# Patient Record
Sex: Female | Born: 1935 | Race: White | Hispanic: No | Smoking: Never smoker
Health system: Southern US, Community
[De-identification: ages and names within clinical notes are randomized; demographics above are authoritative.]

## PROBLEM LIST (undated history)

## (undated) DIAGNOSIS — E039 Hypothyroidism, unspecified: Secondary | ICD-10-CM

## (undated) DIAGNOSIS — C50919 Malignant neoplasm of unspecified site of unspecified female breast: Secondary | ICD-10-CM

## (undated) DIAGNOSIS — Z923 Personal history of irradiation: Secondary | ICD-10-CM

## (undated) DIAGNOSIS — M1990 Unspecified osteoarthritis, unspecified site: Secondary | ICD-10-CM

## (undated) DIAGNOSIS — S81819A Laceration without foreign body, unspecified lower leg, initial encounter: Secondary | ICD-10-CM

## (undated) DIAGNOSIS — E079 Disorder of thyroid, unspecified: Secondary | ICD-10-CM

## (undated) DIAGNOSIS — I10 Essential (primary) hypertension: Secondary | ICD-10-CM

## (undated) DIAGNOSIS — L539 Erythematous condition, unspecified: Secondary | ICD-10-CM

## (undated) DIAGNOSIS — E7800 Pure hypercholesterolemia, unspecified: Secondary | ICD-10-CM

## (undated) DIAGNOSIS — C50011 Malignant neoplasm of nipple and areola, right female breast: Secondary | ICD-10-CM

## (undated) HISTORY — DX: Disorder of thyroid, unspecified: E07.9

## (undated) HISTORY — DX: Pure hypercholesterolemia, unspecified: E78.00

## (undated) HISTORY — DX: Unspecified osteoarthritis, unspecified site: M19.90

## (undated) HISTORY — PX: EYE SURGERY: SHX253

## (undated) HISTORY — PX: JOINT REPLACEMENT: SHX530

## (undated) HISTORY — PX: BREAST BIOPSY: SHX20

## (undated) HISTORY — DX: Personal history of irradiation: Z92.3

---

## 1970-02-25 HISTORY — PX: TUBAL LIGATION: SHX77

## 1998-05-29 ENCOUNTER — Emergency Department (HOSPITAL_COMMUNITY): Admission: EM | Admit: 1998-05-29 | Discharge: 1998-05-29 | Payer: Self-pay | Admitting: Emergency Medicine

## 1998-12-12 ENCOUNTER — Encounter: Admission: RE | Admit: 1998-12-12 | Discharge: 1998-12-12 | Payer: Self-pay | Admitting: Family Medicine

## 1998-12-12 ENCOUNTER — Encounter: Payer: Self-pay | Admitting: Family Medicine

## 1999-03-07 ENCOUNTER — Encounter: Payer: Self-pay | Admitting: Family Medicine

## 1999-03-07 ENCOUNTER — Encounter: Admission: RE | Admit: 1999-03-07 | Discharge: 1999-03-07 | Payer: Self-pay | Admitting: Family Medicine

## 1999-12-18 ENCOUNTER — Other Ambulatory Visit: Admission: RE | Admit: 1999-12-18 | Discharge: 1999-12-18 | Payer: Self-pay | Admitting: Family Medicine

## 2001-01-08 ENCOUNTER — Other Ambulatory Visit: Admission: RE | Admit: 2001-01-08 | Discharge: 2001-01-08 | Payer: Self-pay | Admitting: Family Medicine

## 2001-02-16 ENCOUNTER — Encounter: Payer: Self-pay | Admitting: Family Medicine

## 2001-02-16 ENCOUNTER — Encounter: Admission: RE | Admit: 2001-02-16 | Discharge: 2001-02-16 | Payer: Self-pay | Admitting: Family Medicine

## 2002-02-02 ENCOUNTER — Other Ambulatory Visit: Admission: RE | Admit: 2002-02-02 | Discharge: 2002-02-02 | Payer: Self-pay | Admitting: Family Medicine

## 2002-02-23 ENCOUNTER — Encounter: Admission: RE | Admit: 2002-02-23 | Discharge: 2002-02-23 | Payer: Self-pay | Admitting: Family Medicine

## 2002-02-23 ENCOUNTER — Encounter: Payer: Self-pay | Admitting: Family Medicine

## 2002-03-04 ENCOUNTER — Encounter: Payer: Self-pay | Admitting: Family Medicine

## 2002-03-04 ENCOUNTER — Encounter: Admission: RE | Admit: 2002-03-04 | Discharge: 2002-03-04 | Payer: Self-pay | Admitting: Family Medicine

## 2003-03-08 ENCOUNTER — Encounter: Admission: RE | Admit: 2003-03-08 | Discharge: 2003-03-08 | Payer: Self-pay | Admitting: Family Medicine

## 2004-02-10 ENCOUNTER — Other Ambulatory Visit: Admission: RE | Admit: 2004-02-10 | Discharge: 2004-02-10 | Payer: Self-pay | Admitting: Family Medicine

## 2004-03-13 ENCOUNTER — Encounter: Admission: RE | Admit: 2004-03-13 | Discharge: 2004-03-13 | Payer: Self-pay | Admitting: Family Medicine

## 2005-02-12 ENCOUNTER — Other Ambulatory Visit: Admission: RE | Admit: 2005-02-12 | Discharge: 2005-02-12 | Payer: Self-pay | Admitting: Family Medicine

## 2005-03-21 ENCOUNTER — Encounter: Admission: RE | Admit: 2005-03-21 | Discharge: 2005-03-21 | Payer: Self-pay | Admitting: Family Medicine

## 2006-04-08 ENCOUNTER — Ambulatory Visit: Payer: Self-pay | Admitting: Gastroenterology

## 2006-04-23 ENCOUNTER — Encounter (INDEPENDENT_AMBULATORY_CARE_PROVIDER_SITE_OTHER): Payer: Self-pay | Admitting: *Deleted

## 2006-04-23 ENCOUNTER — Ambulatory Visit: Payer: Self-pay | Admitting: Gastroenterology

## 2008-02-22 ENCOUNTER — Other Ambulatory Visit: Admission: RE | Admit: 2008-02-22 | Discharge: 2008-02-22 | Payer: Self-pay | Admitting: Family Medicine

## 2008-07-20 ENCOUNTER — Encounter: Admission: RE | Admit: 2008-07-20 | Discharge: 2008-07-20 | Payer: Self-pay | Admitting: Family Medicine

## 2009-06-03 ENCOUNTER — Emergency Department (HOSPITAL_COMMUNITY): Admission: EM | Admit: 2009-06-03 | Discharge: 2009-06-03 | Payer: Self-pay | Admitting: Emergency Medicine

## 2010-03-19 ENCOUNTER — Encounter: Payer: Self-pay | Admitting: Family Medicine

## 2010-11-07 ENCOUNTER — Other Ambulatory Visit: Payer: Self-pay | Admitting: Family Medicine

## 2010-11-09 ENCOUNTER — Other Ambulatory Visit: Payer: Self-pay | Admitting: Family Medicine

## 2010-11-09 DIAGNOSIS — N63 Unspecified lump in unspecified breast: Secondary | ICD-10-CM

## 2010-11-16 ENCOUNTER — Ambulatory Visit
Admission: RE | Admit: 2010-11-16 | Discharge: 2010-11-16 | Disposition: A | Discharge status: Home / Self Care | Payer: Medicare Other | Source: Ambulatory Visit | Attending: Family Medicine | Admitting: Family Medicine

## 2010-11-16 DIAGNOSIS — N63 Unspecified lump in unspecified breast: Secondary | ICD-10-CM

## 2010-11-26 DIAGNOSIS — C50919 Malignant neoplasm of unspecified site of unspecified female breast: Secondary | ICD-10-CM

## 2010-11-26 HISTORY — DX: Malignant neoplasm of unspecified site of unspecified female breast: C50.919

## 2010-11-28 ENCOUNTER — Ambulatory Visit (INDEPENDENT_AMBULATORY_CARE_PROVIDER_SITE_OTHER): Payer: Medicare Other | Admitting: Surgery

## 2010-11-28 ENCOUNTER — Encounter (INDEPENDENT_AMBULATORY_CARE_PROVIDER_SITE_OTHER): Payer: Self-pay | Admitting: Surgery

## 2010-11-28 VITALS — BP 138/82 | HR 60 | Temp 97.6°F | Resp 16 | Ht 63.5 in | Wt 161.4 lb

## 2010-11-28 DIAGNOSIS — N63 Unspecified lump in unspecified breast: Secondary | ICD-10-CM

## 2010-11-28 DIAGNOSIS — N6310 Unspecified lump in the right breast, unspecified quadrant: Secondary | ICD-10-CM

## 2010-11-28 HISTORY — PX: BREAST SURGERY: SHX581

## 2010-11-28 NOTE — Progress Notes (Signed)
Chief Complaint  Patient presents with  . Other    new pt eval of right nipple changes     HPI Molly Lambert is a 76 y.o. female.   HPI The patient presents at the request of Dr.Lin of radiology due to a red,  hard right nipple. This has been present since July of 2012. He has not bled. It has not increased in size. He is not painful.  History reviewed. No pertinent past medical history.  Past Surgical History  Procedure Date  . Tubal ligation 1972    Family History  Problem Relation Age of Onset  . Kidney disease Mother     kidney failure   . Heart disease Father     heart attack     Social History History  Substance Use Topics  . Smoking status: Never Smoker   . Smokeless tobacco: Never Used  . Alcohol Use: No    No Known Allergies  Current Outpatient Prescriptions  Medication Sig Dispense Refill  . atenolol-chlorthalidone (TENORETIC) 50-25 MG per tablet       . simvastatin (ZOCOR) 20 MG tablet       . SYNTHROID 75 MCG tablet         Review of Systems Review of Systems  Constitutional: Negative.   HENT: Negative.   Eyes: Negative.   Respiratory: Negative.   Cardiovascular: Negative.   Gastrointestinal: Negative.   Genitourinary: Negative.   Musculoskeletal: Negative.   Skin: Negative.   Neurological: Negative.   Hematological: Negative.   Psychiatric/Behavioral: Negative.     Blood pressure 138/82, pulse 60, temperature 97.6 F (36.4 C), resp. rate 16, height 5' 3.5" (1.613 m), weight 161 lb 6 oz (73.199 kg).  Physical Exam Physical Exam  Constitutional: She is oriented to person, place, and time. She appears well-developed and well-nourished.  HENT:  Head: Normocephalic.  Nose: Nose normal.  Eyes: Conjunctivae are normal. Pupils are equal, round, and reactive to light.  Neck: Normal range of motion. Neck supple.  Cardiovascular: Normal rate, regular rhythm and normal heart sounds.   Pulmonary/Chest: Effort normal and breath sounds normal.    Right nipple hard red and full.  Non tender.Right axilla normal.  Left axilla normal. Left breast normal  Musculoskeletal: Normal range of motion.  Neurological: She is alert and oriented to person, place, and time.  Skin: Skin is warm and dry.  Psychiatric: She has a normal mood and affect. Her behavior is normal. Judgment and thought content normal.    Data Reviewed Mammo report BIRADS 4 FULLNESS AT NIPPLE RIGHT  Assessment    Right nipple mass     Plan    Punch biopsy Right nipple to exclude malignancy.  The patient understands the procedure and reason.  She agrees to proceed.  Under sterile conditions,  Right nipple was prepped.  2 MM core biopsy taken after injection of 1% lidocaine plain. Core taken and sent to pathology.  Dry dressing applied. She tolerated procedure well. RTC  IN 1-2 WEEKS.       Molly Lambert A. 11/28/2010, 3:58 PM

## 2010-11-28 NOTE — Patient Instructions (Signed)
Keep a band aid over biopsy site.  Place neosporin and band aid over site daily.  OK to shower.  Call Friday for results.

## 2010-12-03 ENCOUNTER — Telehealth (INDEPENDENT_AMBULATORY_CARE_PROVIDER_SITE_OTHER): Payer: Self-pay | Admitting: Surgery

## 2010-12-05 ENCOUNTER — Encounter: Payer: Self-pay | Admitting: Surgery

## 2010-12-05 NOTE — Telephone Encounter (Signed)
PT CONTACTED THIS AM RE PATHOLOGY RESULTS. SHE WILL RECEIVE FURTHER INSTRUCTIONS AFTER BREAST CANCER CONFERENCE TODAY.

## 2010-12-06 ENCOUNTER — Other Ambulatory Visit (INDEPENDENT_AMBULATORY_CARE_PROVIDER_SITE_OTHER): Payer: Self-pay | Admitting: General Surgery

## 2010-12-06 DIAGNOSIS — C50912 Malignant neoplasm of unspecified site of left female breast: Secondary | ICD-10-CM

## 2010-12-06 DIAGNOSIS — C50911 Malignant neoplasm of unspecified site of right female breast: Secondary | ICD-10-CM

## 2010-12-13 ENCOUNTER — Ambulatory Visit
Admission: RE | Admit: 2010-12-13 | Discharge: 2010-12-13 | Disposition: A | Discharge status: Home / Self Care | Payer: Medicare Other | Source: Ambulatory Visit | Attending: Surgery | Admitting: Surgery

## 2010-12-13 DIAGNOSIS — C50911 Malignant neoplasm of unspecified site of right female breast: Secondary | ICD-10-CM

## 2010-12-13 MED ORDER — GADOBENATE DIMEGLUMINE 529 MG/ML IV SOLN
15.0000 mL | Freq: Once | INTRAVENOUS | Status: AC | PRN
  Administered 2010-12-13: 15 mL via INTRAVENOUS

## 2010-12-28 ENCOUNTER — Ambulatory Visit (INDEPENDENT_AMBULATORY_CARE_PROVIDER_SITE_OTHER): Payer: Medicare Other | Admitting: Surgery

## 2010-12-28 ENCOUNTER — Other Ambulatory Visit (INDEPENDENT_AMBULATORY_CARE_PROVIDER_SITE_OTHER): Payer: Self-pay | Admitting: Surgery

## 2010-12-28 ENCOUNTER — Encounter (INDEPENDENT_AMBULATORY_CARE_PROVIDER_SITE_OTHER): Payer: Self-pay | Admitting: Surgery

## 2010-12-28 VITALS — BP 150/88 | HR 70 | Temp 97.4°F | Resp 14 | Ht 63.5 in | Wt 159.6 lb

## 2010-12-28 DIAGNOSIS — C50919 Malignant neoplasm of unspecified site of unspecified female breast: Secondary | ICD-10-CM

## 2010-12-28 DIAGNOSIS — C50911 Malignant neoplasm of unspecified site of right female breast: Secondary | ICD-10-CM

## 2010-12-28 NOTE — Progress Notes (Signed)
The patient returns to clinic today. Diagnosis came back as invasive breast cancer. This is in her right nipple. MRI done showing maximum diameter be 2.3 cm. This is ER positive PR positive HER-2/neu negative. MR showed no other foci of malignancy.  Review of systems: Negative  Physical exam: Right breast shows minimal ulceration at the nipple. Residual normal. Left breast and left axilla normal  Extremities: No edema with normal range of motion  Cardiac: Regular rate and rhythm  Pulmonary: Clear to auscultation  Abdomen: Benign  Impression: T2 N0 MX stage II right breast cancer in the nipple ER positive PR positive HER-2/neu negative  Plan: I discussed both mastectomy and breast conserving surgery with the patient and family present today. I discussed the pros and cons of each. I discussed potential complications of surgical intervention to include bleeding, infection, seroma formation, need for further surgery, blood clot formation, arm stiffness, shoulder stiffness, numbness of the breast or chest wall. She would like to proceed with right breast lumpectomy which include removal of her nipple and right axillary sentinel lymph node mapping. I discussed the use of the dyes for the mapping procedure and potential complications of using these dyes. These include redness, skin discoloration, soreness, anaphylaxis. She agrees to proceed

## 2010-12-28 NOTE — Patient Instructions (Signed)
You will be scheduled for surgery.Lumpectomy, Breast Conserving Surgery Care After Please read the instructions outlined below and refer to this sheet in the next few weeks. These discharge instructions provide you with general information on caring for yourself after you leave the hospital. Your surgeon may also give you specific instructions. While your treatment has been planned according to the most current medical practices available, unavoidable complications occasionally occur. If you have any problems or questions after discharge, please call your surgeon. Reasons for a lumpectomy:  Any solid breast mass.   Grouped significant nodularity that may be confused with a solitary breast mass.  AFTER THE PROCEDURE  After surgery, you will be taken to the recovery area where a nurse will watch and check your progress. Once you're awake, stable, and taking fluids well, barring other problems you will be allowed to go home.   Ice packs applied to your operative site may help with discomfort and keep the swelling down.   A small rubber drain may be placed in the incision for a couple of days to prevent a hematoma in the breast.   A pressure dressing may be applied for 24 to 48 hours to prevent bleeding.   Keep the wound dry.   You may resume a normal diet and activities as directed. Avoid strenuous activities affecting the arm on the side of the biopsy site such as tennis, swimming, heavy lifting (more than 10 pounds) or pulling.   Bruising in the breast is normal following this procedure.   Wearing a bra - even to bed - may be more comfortable and also help keep the dressing on.   Change dressings as directed.   Only take over-the-counter or prescription medicines for pain, discomfort, or fever as directed by your caregiver.  Call for your results as instructed by your surgeon. Remember it isyour responsibility to get the results of your lumpectomy if your surgeon asked you to follow-up.  Do not assume everything is fine if you have not heard from your caregiver. SEEK MEDICAL CARE IF:   There is increased bleeding (more than a small spot) from the wound.   You notice redness, swelling, or increasing pain in the wound.   Pus is coming from wound.   An unexplained oral temperature above 102 F (38.9 C) develops.   You notice a foul smell coming from the wound or dressing.  SEEK IMMEDIATE MEDICAL CARE IF:   You develop a rash.   You have difficulty breathing.   You have any allergic problems.  Document Released: 02/27/2006 Document Revised: 10/24/2010 Document Reviewed: 01/30/2007 [facility] Patient Information 2012 [place], [place].

## 2010-12-31 ENCOUNTER — Other Ambulatory Visit (INDEPENDENT_AMBULATORY_CARE_PROVIDER_SITE_OTHER): Payer: Self-pay | Admitting: Surgery

## 2011-01-10 ENCOUNTER — Encounter (HOSPITAL_COMMUNITY): Payer: Self-pay

## 2011-01-15 ENCOUNTER — Encounter (HOSPITAL_COMMUNITY): Payer: Self-pay

## 2011-01-15 ENCOUNTER — Encounter (HOSPITAL_COMMUNITY)
Admission: RE | Admit: 2011-01-15 | Discharge: 2011-01-15 | Disposition: A | Discharge status: Home / Self Care | Payer: Medicare Other | Source: Ambulatory Visit | Attending: Surgery | Admitting: Surgery

## 2011-01-15 ENCOUNTER — Other Ambulatory Visit: Payer: Self-pay

## 2011-01-15 HISTORY — DX: Laceration without foreign body, unspecified lower leg, initial encounter: S81.819A

## 2011-01-15 HISTORY — DX: Essential (primary) hypertension: I10

## 2011-01-15 LAB — COMPREHENSIVE METABOLIC PANEL
ALT: 13 U/L (ref 0–35)
AST: 17 U/L (ref 0–37)
Calcium: 9.8 mg/dL (ref 8.4–10.5)
Creatinine, Ser: 0.85 mg/dL (ref 0.50–1.10)
Sodium: 137 mEq/L (ref 135–145)
Total Protein: 7.1 g/dL (ref 6.0–8.3)

## 2011-01-15 LAB — DIFFERENTIAL
Eosinophils Relative: 1 % (ref 0–5)
Lymphocytes Relative: 26 % (ref 12–46)
Lymphs Abs: 2.3 10*3/uL (ref 0.7–4.0)
Monocytes Absolute: 0.7 10*3/uL (ref 0.1–1.0)
Neutro Abs: 5.4 10*3/uL (ref 1.7–7.7)

## 2011-01-15 LAB — CBC
MCH: 30.9 pg (ref 26.0–34.0)
MCHC: 33.4 g/dL (ref 30.0–36.0)
Platelets: 282 10*3/uL (ref 150–400)
RDW: 13.1 % (ref 11.5–15.5)

## 2011-01-15 LAB — SURGICAL PCR SCREEN: MRSA, PCR: NEGATIVE

## 2011-01-15 NOTE — Pre-Procedure Instructions (Addendum)
22 Molly Lambert  01/15/2011   Your procedure is scheduled on:  01/22/11  Report to [facility] at 530 AM.  Call this number if you have problems the morning of surgery: [phone]   Remember:   Do not eat food:After Midnight.  Do not drink clear liquids: 4 Hours before arrival.  Take these medicines the morning of surgery with A SIP OF WATER: SYNTHROID,TENORIC   Do not wear jewelry, make-up or nail polish.  Do not wear lotions, powders, or perfumes. You may wear deodorant.  Do not shave 48 hours prior to surgery.  Do not bring valuables to the hospital.  Contacts, dentures or bridgework may not be worn into surgery.  Leave suitcase in the car. After surgery it may be brought to your room.  For patients admitted to the hospital, checkout time is 11:00 AM the day of discharge.   Patients discharged the day of surgery will not be allowed to drive home.  Name and phone number of your driver: FAMILY  Special Instructions: CHG Shower Use Special Wash: 1/2 bottle night before surgery and 1/2 bottle morning of surgery.   Please read over the following fact sheets that you were given: Pain Booklet, MRSA Information and Surgical Site Infection Prevention

## 2011-01-16 NOTE — Progress Notes (Signed)
Anesthesia , please review EKG prior to surgery. Thank you.

## 2011-01-16 NOTE — Consult Note (Signed)
Anesthesia: Patient is a 76 year old female for right breast lumpectomy for cancer.  Other hx includes HTN and cataracts.  I was asked to review her preoperative EKG showing SB with LAFB.  No CV symptoms recorded at PAT visit.  Her PCP is listed as Dr. [name] [phone]).  I attempted to call for a comparison EKG but there is no answer, and we do not have a fax number listed.  I have asked the nursing staff to follow up.  If no comparison EKG available, still anticipate that she could proceed with this procedure if she remains asymptomatic.  Labs and CXR noted.

## 2011-01-21 MED ORDER — CEFAZOLIN SODIUM-DEXTROSE 2-3 GM-% IV SOLR
2.0000 g | INTRAVENOUS | Status: AC
  Administered 2011-01-22: 2 g via INTRAVENOUS
  Filled 2011-01-21: qty 50

## 2011-01-21 NOTE — Consult Note (Signed)
Anesthesia:  See my consult note from 01/16/11.  Prior phone number for Dr. Audria Nine was incorrect per RN.  They were told to contact the [facility] [phone]) which they did on Friday 01/18/11, and it was closed.  I just tried again and the message still says the office is currently closed.  Clinical correlation DOS by her Anesthesiologist, but as before, I anticipate that she could proceed if remains asymptomatic.

## 2011-01-21 NOTE — H&P (View-Only) (Signed)
The patient returns to clinic today. Diagnosis came back as invasive breast cancer. This is in her right nipple. MRI done showing maximum diameter be 2.3 cm. This is ER positive PR positive HER-2/neu negative. MR showed no other foci of malignancy.  Review of systems: Negative  Physical exam: Right breast shows minimal ulceration at the nipple. Residual normal. Left breast and left axilla normal  Extremities: No edema with normal range of motion  Cardiac: Regular rate and rhythm  Pulmonary: Clear to auscultation  Abdomen: Benign  Impression: T2 N0 MX stage II right breast cancer in the nipple ER positive PR positive HER-2/neu negative  Plan: I discussed both mastectomy and breast conserving surgery with the patient and family present today. I discussed the pros and cons of each. I discussed potential complications of surgical intervention to include bleeding, infection, seroma formation, need for further surgery, blood clot formation, arm stiffness, shoulder stiffness, numbness of the breast or chest wall. She would like to proceed with right breast lumpectomy which include removal of her nipple and right axillary sentinel lymph node mapping. I discussed the use of the dyes for the mapping procedure and potential complications of using these dyes. These include redness, skin discoloration, soreness, anaphylaxis. She agrees to proceed 

## 2011-01-21 NOTE — Interval H&P Note (Signed)
History and Physical Interval Note:   01/21/2011   9:48 AM   Molly Lambert  has presented today for surgery, with the diagnosis of right breast cancer  The various methods of treatment have been discussed with the patient and family. After consideration of risks, benefits and other options for treatment, the patient has consented to  Procedure(s): BREAST LUMPECTOMY WITH SENTINEL LYMPH NODE BX as a surgical intervention .  The patients' history has been reviewed, patient examined, no change in status, stable for surgery.  I have reviewed the patients' chart and labs.  Questions were answered to the patient's satisfaction.     Tenecia Ignasiak A.  MD

## 2011-01-22 ENCOUNTER — Encounter (HOSPITAL_COMMUNITY): Payer: Self-pay | Admitting: Surgery

## 2011-01-22 ENCOUNTER — Encounter (HOSPITAL_COMMUNITY)
Admission: RE | Disposition: A | Discharge status: Home / Self Care | Payer: Self-pay | Source: Ambulatory Visit | Attending: Surgery

## 2011-01-22 ENCOUNTER — Other Ambulatory Visit (INDEPENDENT_AMBULATORY_CARE_PROVIDER_SITE_OTHER): Payer: Self-pay | Admitting: Surgery

## 2011-01-22 ENCOUNTER — Ambulatory Visit (HOSPITAL_COMMUNITY): Payer: Medicare Other

## 2011-01-22 ENCOUNTER — Encounter (HOSPITAL_COMMUNITY): Payer: Self-pay | Admitting: Vascular Surgery

## 2011-01-22 ENCOUNTER — Ambulatory Visit (HOSPITAL_COMMUNITY)
Admission: RE | Admit: 2011-01-22 | Discharge: 2011-01-22 | Disposition: A | Discharge status: Home / Self Care | Payer: Medicare Other | Source: Ambulatory Visit | Attending: Surgery | Admitting: Surgery

## 2011-01-22 ENCOUNTER — Ambulatory Visit (HOSPITAL_COMMUNITY): Payer: Medicare Other | Admitting: Vascular Surgery

## 2011-01-22 DIAGNOSIS — C50019 Malignant neoplasm of nipple and areola, unspecified female breast: Secondary | ICD-10-CM | POA: Insufficient documentation

## 2011-01-22 DIAGNOSIS — C50919 Malignant neoplasm of unspecified site of unspecified female breast: Secondary | ICD-10-CM

## 2011-01-22 DIAGNOSIS — N6310 Unspecified lump in the right breast, unspecified quadrant: Secondary | ICD-10-CM

## 2011-01-22 DIAGNOSIS — Z01810 Encounter for preprocedural cardiovascular examination: Secondary | ICD-10-CM | POA: Insufficient documentation

## 2011-01-22 DIAGNOSIS — Z01818 Encounter for other preprocedural examination: Secondary | ICD-10-CM | POA: Insufficient documentation

## 2011-01-22 DIAGNOSIS — C50911 Malignant neoplasm of unspecified site of right female breast: Secondary | ICD-10-CM

## 2011-01-22 DIAGNOSIS — I10 Essential (primary) hypertension: Secondary | ICD-10-CM | POA: Insufficient documentation

## 2011-01-22 DIAGNOSIS — E039 Hypothyroidism, unspecified: Secondary | ICD-10-CM | POA: Insufficient documentation

## 2011-01-22 HISTORY — PX: BREAST LUMPECTOMY: SHX2

## 2011-01-22 SURGERY — BREAST LUMPECTOMY WITH SENTINEL LYMPH NODE BX
Anesthesia: General | Site: Breast | Laterality: Right | Wound class: Clean

## 2011-01-22 MED ORDER — OXYCODONE-ACETAMINOPHEN 5-325 MG PO TABS: 1.0000 | ORAL_TABLET | ORAL | Status: AC | PRN

## 2011-01-22 MED ORDER — DEXAMETHASONE SODIUM PHOSPHATE 4 MG/ML IJ SOLN
INTRAMUSCULAR | Status: DC | PRN
  Administered 2011-01-22: 8 mg via INTRAVENOUS

## 2011-01-22 MED ORDER — SODIUM CHLORIDE 0.9 % IR SOLN
Status: DC | PRN
  Administered 2011-01-22: 1000 mL

## 2011-01-22 MED ORDER — LACTATED RINGERS IV SOLN
INTRAVENOUS | Status: DC | PRN
  Administered 2011-01-22 (×2): via INTRAVENOUS

## 2011-01-22 MED ORDER — DEXTROSE 5 % IV SOLN
INTRAVENOUS | Status: DC | PRN
  Administered 2011-01-22 (×2): via INTRAVENOUS

## 2011-01-22 MED ORDER — FENTANYL CITRATE 0.05 MG/ML IJ SOLN
INTRAMUSCULAR | Status: DC | PRN
  Administered 2011-01-22 (×2): 50 ug via INTRAVENOUS

## 2011-01-22 MED ORDER — PROPOFOL 10 MG/ML IV EMUL
INTRAVENOUS | Status: DC | PRN
  Administered 2011-01-22: 100 mg via INTRAVENOUS
  Administered 2011-01-22: 20 mg via INTRAVENOUS

## 2011-01-22 MED ORDER — ACETAMINOPHEN 10 MG/ML IV SOLN
INTRAVENOUS | Status: AC
  Filled 2011-01-22: qty 100

## 2011-01-22 MED ORDER — TECHNETIUM TC 99M SULFUR COLLOID FILTERED
1.0000 | Freq: Once | INTRAVENOUS | Status: AC | PRN
  Administered 2011-01-22: 1 via INTRADERMAL

## 2011-01-22 MED ORDER — DROPERIDOL 2.5 MG/ML IJ SOLN: 0.6250 mg | INTRAMUSCULAR | Status: DC | PRN

## 2011-01-22 MED ORDER — BUPIVACAINE-EPINEPHRINE 0.25% -1:200000 IJ SOLN
INTRAMUSCULAR | Status: DC | PRN
  Administered 2011-01-22: 2.5 mL
  Administered 2011-01-22: 30 mL

## 2011-01-22 MED ORDER — LIDOCAINE HCL (CARDIAC) 20 MG/ML IV SOLN
INTRAVENOUS | Status: DC | PRN
  Administered 2011-01-22: 80 mg via INTRAVENOUS

## 2011-01-22 MED ORDER — MIDAZOLAM HCL 5 MG/5ML IJ SOLN
INTRAMUSCULAR | Status: DC | PRN
  Administered 2011-01-22: 2 mg via INTRAVENOUS

## 2011-01-22 MED ORDER — HYDROMORPHONE HCL PF 1 MG/ML IJ SOLN
0.2500 mg | INTRAMUSCULAR | Status: DC | PRN
  Administered 2011-01-22: 0.5 mg via INTRAVENOUS

## 2011-01-22 MED ORDER — ACETAMINOPHEN 10 MG/ML IV SOLN
INTRAVENOUS | Status: DC | PRN
  Administered 2011-01-22: 1000 mg via INTRAVENOUS

## 2011-01-22 MED ORDER — SODIUM CHLORIDE 0.9 % IJ SOLN
INTRAMUSCULAR | Status: DC | PRN
  Administered 2011-01-22: 08:00:00

## 2011-01-22 MED ORDER — ONDANSETRON HCL 4 MG/2ML IJ SOLN
INTRAMUSCULAR | Status: DC | PRN
  Administered 2011-01-22: 4 mg via INTRAVENOUS

## 2011-01-22 SURGICAL SUPPLY — 49 items
APPLIER CLIP 9.375 MED OPEN (MISCELLANEOUS) ×2
BINDER BREAST LRG (GAUZE/BANDAGES/DRESSINGS) ×2 IMPLANT
BINDER BREAST XLRG (GAUZE/BANDAGES/DRESSINGS) IMPLANT
BLADE SURG 10 STRL SS (BLADE) ×2 IMPLANT
BLADE SURG 15 STRL LF DISP TIS (BLADE) ×1 IMPLANT
BLADE SURG 15 STRL SS (BLADE) ×1
CANISTER SUCTION 2500CC (MISCELLANEOUS) ×2 IMPLANT
CHLORAPREP W/TINT 26ML (MISCELLANEOUS) ×2 IMPLANT
CLIP APPLIE 9.375 MED OPEN (MISCELLANEOUS) ×1 IMPLANT
CLOTH BEACON ORANGE TIMEOUT ST (SAFETY) ×2 IMPLANT
CONT SPEC 4OZ CLIKSEAL STRL BL (MISCELLANEOUS) ×2 IMPLANT
COVER PROBE W GEL 5X96 (DRAPES) ×2 IMPLANT
COVER SURGICAL LIGHT HANDLE (MISCELLANEOUS) ×2 IMPLANT
DERMABOND ADHESIVE PROPEN (GAUZE/BANDAGES/DRESSINGS) ×1
DERMABOND ADVANCED (GAUZE/BANDAGES/DRESSINGS) ×1
DERMABOND ADVANCED .7 DNX12 (GAUZE/BANDAGES/DRESSINGS) ×1 IMPLANT
DERMABOND ADVANCED .7 DNX6 (GAUZE/BANDAGES/DRESSINGS) ×1 IMPLANT
DEVICE DUBIN SPECIMEN MAMMOGRA (MISCELLANEOUS) IMPLANT
DRAPE LAPAROSCOPIC ABDOMINAL (DRAPES) ×2 IMPLANT
ELECT CAUTERY BLADE 6.4 (BLADE) ×2 IMPLANT
ELECT REM PT RETURN 9FT ADLT (ELECTROSURGICAL) ×2
ELECTRODE REM PT RTRN 9FT ADLT (ELECTROSURGICAL) ×1 IMPLANT
GLOVE BIO SURGEON STRL SZ8 (GLOVE) ×2 IMPLANT
GLOVE BIOGEL PI IND STRL 6.5 (GLOVE) ×1 IMPLANT
GLOVE BIOGEL PI IND STRL 8 (GLOVE) ×1 IMPLANT
GLOVE BIOGEL PI INDICATOR 6.5 (GLOVE) ×1
GLOVE BIOGEL PI INDICATOR 8 (GLOVE) ×1
GOWN STRL NON-REIN LRG LVL3 (GOWN DISPOSABLE) ×4 IMPLANT
KIT BASIN OR (CUSTOM PROCEDURE TRAY) ×2 IMPLANT
KIT MARKER MARGIN INK (KITS) ×2 IMPLANT
KIT ROOM TURNOVER OR (KITS) ×2 IMPLANT
NEEDLE 18GX1X1/2 (RX/OR ONLY) (NEEDLE) ×2 IMPLANT
NEEDLE HYPO 25GX1X1/2 BEV (NEEDLE) ×4 IMPLANT
NS IRRIG 1000ML POUR BTL (IV SOLUTION) ×2 IMPLANT
PACK SURGICAL SETUP 50X90 (CUSTOM PROCEDURE TRAY) ×2 IMPLANT
PAD ARMBOARD 7.5X6 YLW CONV (MISCELLANEOUS) ×2 IMPLANT
PENCIL BUTTON HOLSTER BLD 10FT (ELECTRODE) ×2 IMPLANT
SPONGE LAP 18X18 X RAY DECT (DISPOSABLE) ×2 IMPLANT
SUT MON AB 4-0 PC3 18 (SUTURE) IMPLANT
SUT SILK 2 0 SH (SUTURE) IMPLANT
SUT VIC AB 3-0 SH 27 (SUTURE) ×1
SUT VIC AB 3-0 SH 27XBRD (SUTURE) ×1 IMPLANT
SYR BULB 3OZ (MISCELLANEOUS) ×2 IMPLANT
SYR CONTROL 10ML LL (SYRINGE) ×4 IMPLANT
TOWEL OR 17X24 6PK STRL BLUE (TOWEL DISPOSABLE) ×2 IMPLANT
TOWEL OR 17X26 10 PK STRL BLUE (TOWEL DISPOSABLE) ×2 IMPLANT
TUBE CONNECTING 12X1/4 (SUCTIONS) ×2 IMPLANT
WATER STERILE IRR 1000ML POUR (IV SOLUTION) IMPLANT
YANKAUER SUCT BULB TIP NO VENT (SUCTIONS) ×2 IMPLANT

## 2011-01-22 NOTE — Transfer of Care (Signed)
Immediate Anesthesia Transfer of Care Note  Patient: Molly Lambert  Procedure(s) Performed:  BREAST LUMPECTOMY WITH SENTINEL LYMPH NODE BX - right breast central lumpectomy right sentiel lymph node mapping  Patient Location: PACU  Anesthesia Type: General  Level of Consciousness: awake, alert  and oriented  Airway & Oxygen Therapy: Patient Spontanous Breathing and Patient connected to nasal cannula oxygen  Post-op Assessment: Report given to PACU RN and Post -op Vital signs reviewed and stable  Post vital signs: Reviewed and stable  Complications: No apparent anesthesia complications

## 2011-01-22 NOTE — Preoperative (Signed)
Beta Blockers   Took Tenorectic @ 4am today.

## 2011-01-22 NOTE — Interval H&P Note (Signed)
History and Physical Interval Note:   01/22/2011   7:15 AM   Molly Lambert  has presented today for surgery, with the diagnosis of right breast cancer  The various methods of treatment have been discussed with the patient and family. After consideration of risks, benefits and other options for treatment, the patient has consented to  Procedure(s): BREAST LUMPECTOMY WITH SENTINEL LYMPH NODE BX as a surgical intervention .  The patients' history has been reviewed, patient examined, no change in status, stable for surgery.  I have reviewed the patients' chart and labs.  Questions were answered to the patient's satisfaction.     Deleah Tison A.  MD

## 2011-01-22 NOTE — Anesthesia Postprocedure Evaluation (Signed)
Anesthesia Post Note  Patient: Molly Lambert  Procedure(s) Performed:  BREAST LUMPECTOMY WITH SENTINEL LYMPH NODE BX - right breast central lumpectomy right sentiel lymph node mapping  Anesthesia type: General  Patient location: PACU  Post pain: Pain level controlled  Post assessment: Patient's Cardiovascular Status Stable  Last Vitals:  Filed Vitals:   01/22/11 1009  BP: 132/66  Pulse: 40  Temp:   Resp: 10    Post vital signs: Reviewed and stable  Level of consciousness: sedated  Complications: No apparent anesthesia complications

## 2011-01-22 NOTE — Transfer of Care (Signed)
Anesthesia Post Note  Patient: Molly Lambert  Procedure(s) Performed: BREAST LUMPECTOMY WITH SENTINEL LYMPH NODE BX - right breast central lumpectomy right sentiel lymph node mapping    Anesthesia type: General  Patient location: PACU  Post pain: Pain level controlled  Post assessment: Patient's Cardiovascular Status Stable  Last Vitals: There were no vitals filed for this visit.  Post vital signs: Reviewed and stable  Level of consciousness: sedated  Complications: No apparent anesthesia complications

## 2011-01-22 NOTE — Anesthesia Preprocedure Evaluation (Addendum)
Anesthesia Evaluation  Patient identified by MRN, date of birth, ID band Patient awake    Reviewed: Allergy & Precautions, H&P , NPO status , Patient's Chart, lab work & pertinent test results, reviewed documented beta blocker date and time   History of Anesthesia Complications (+) PONV  Airway Mallampati: I TM Distance: >3 FB Neck ROM: Full    Dental  (+) Teeth Intact   Pulmonary neg pulmonary ROS,  clear to auscultation        Cardiovascular hypertension (took tenoretic @ 4am today), Pt. on home beta blockers Regular Normal- Systolic murmurs    Neuro/Psych Negative Neurological ROS     GI/Hepatic negative GI ROS, Neg liver ROS,   Endo/Other  Hypothyroidism   Renal/GU negative Renal ROS     Musculoskeletal   Abdominal   Peds  Hematology   Anesthesia Other Findings   Reproductive/Obstetrics                          Anesthesia Physical Anesthesia Plan  ASA: II  Anesthesia Plan: General   Post-op Pain Management:    Induction: Intravenous  Airway Management Planned: LMA  Additional Equipment:   Intra-op Plan:   Post-operative Plan: Extubation in OR  Informed Consent: I have reviewed the patients History and Physical, chart, labs and discussed the procedure including the risks, benefits and alternatives for the proposed anesthesia with the patient or authorized representative who has indicated his/her understanding and acceptance.     Plan Discussed with: CRNA and Surgeon  Anesthesia Plan Comments:         Anesthesia Quick Evaluation

## 2011-01-22 NOTE — Op Note (Signed)
Breast Lumpectomy with Sentinal Node Biopsy Procedure Note  Indications: This patient presents with history of a right breast mass core biopsy proven ductal carcinoma of breast invading the nipple.  She wished breast conserving surgery. Given the clinical history and physical exam, along with indicated diagnostic studies,  Right breast central lumpectomy and sentinel lymph node mapping will be performed.  Pre-operative Diagnosis: right breast cancer stage 1  Post-operative Diagnosis: same   Surgeon: Harriette Bouillon A.   Assistants: OR staff  Anesthesia: General LMA anesthesia and Local anesthesia 0.25.% bupivacaine, with epinephrine  ASA Class: 2  Procedure Details  The patient was seen in the Holding Room. The risks, benefits, complications, treatment options, and expected outcomes were discussed with the patient. The patient understood the nipple was to be removed with the mass. The possibilities of reaction to medication, pulmonary aspiration, bleeding, infection, the need for additional procedures, failure to diagnose a condition, and creating a complication requiring transfusion or operation were discussed with the patient. The patient concurred with the proposed plan, giving informed consent. The site of surgery properly noted/marked. The patient was taken to Operating Room, identified as Molly Lambert, and the procedure verified as right lumpectomy with right sentinel lymph node mapping . A Time Out was held and the above information confirmed.  After induction of anesthesia,   4 cc of methylene blue dye was injected subareolar on the right and the right breast and chest were prepped and draped in standard fashion. The lumpectomy was performed by creating an oblique incision over the central region  of the breast around the nipple since the cancer was adherent to the nipple.Bartolo Darter kit  was used to mark the margins and hemostasis was achieved with cautery and the mass was remove with negative  gross margins. The wound was irrigated and  clips were placed to mark the cavity and the incision  closed with a 3-0 Vicryl 4-0 monocryl  subcuticular closure in layers.  The sentinel node part of the procedure was done.  Neoprobe was used to mark the hot spot in the right axilla.  Incision was made after infiltration with local.  Hot blue node was  found in level 1 axillary  Region.  No other nodes mapped.   Background counts approached baseline counts. The wound was irrigated  And made  hemostatic and closed in layers with 3-0 vicryl and 4-0 monocryl.  The patient's incisions were dressed with dermabond.       Sterile dressings were applied. At the end of the operation, all sponge, instrument, and needle counts were correct.  Findings: grossly clear surgical margins and no adenopathy  Estimated Blood Loss:  less than 50 mL         Drains: none         Total IV Fluids: 1000 mL         Specimens: breast mass   1 sentinel lymph node          Complications:  None; patient tolerated the procedure well.         Disposition: PACU - hemodynamically stable.         Condition: Stable

## 2011-01-22 NOTE — Anesthesia Procedure Notes (Signed)
Procedure Name: LMA Insertion Date/Time: 01/22/2011 8:00 AM Performed by: Tyrone Nine Pre-anesthesia Checklist: Patient identified, Emergency Drugs available, Suction available and Patient being monitored Patient Re-evaluated:Patient Re-evaluated prior to inductionOxygen Delivery Method: Circle System Utilized Preoxygenation: Pre-oxygenation with 100% oxygen Intubation Type: IV induction Ventilation: Mask ventilation without difficulty LMA: LMA with gastric port inserted LMA Size: 4.0 Grade View: Grade I Number of attempts: 1 Placement Confirmation: positive ETCO2 and breath sounds checked- equal and bilateral Tube secured with: Tape Dental Injury: Teeth and Oropharynx as per pre-operative assessment

## 2011-02-06 ENCOUNTER — Encounter (INDEPENDENT_AMBULATORY_CARE_PROVIDER_SITE_OTHER): Payer: Self-pay | Admitting: Surgery

## 2011-02-06 ENCOUNTER — Ambulatory Visit (INDEPENDENT_AMBULATORY_CARE_PROVIDER_SITE_OTHER): Payer: Medicare Other | Admitting: Surgery

## 2011-02-06 DIAGNOSIS — Z9889 Other specified postprocedural states: Secondary | ICD-10-CM

## 2011-02-06 DIAGNOSIS — C50919 Malignant neoplasm of unspecified site of unspecified female breast: Secondary | ICD-10-CM

## 2011-02-06 NOTE — Patient Instructions (Signed)
Follow up 1 month

## 2011-02-06 NOTE — Progress Notes (Signed)
Molly Lambert Millstein    [id number] 02/06/2011    08-03-1934   CC: Post op Right central lumpectomy and R SLN   HPI: The patient returns for post op follow-up. She underwent a RIGHT CENTAL LUMPECTOMY AND sln mapping  on 01/22/11. Over all she feels that she is doing well.   PE: The incision is healing nicely and there is no evidence of infection or hematoma.  Marland Kitchen  DATA REVIEWED: Pathology report showed [id number] ER PR POS HER 2 NEU NEG STAGE 1  IMPRESSION: Patient doing well  PLAN: Her next visit will be in 1 MONTH.  Refer to med and rad onc.

## 2011-02-14 ENCOUNTER — Encounter: Payer: Self-pay | Admitting: *Deleted

## 2011-02-14 NOTE — Progress Notes (Signed)
Mailed before appt letter packet to pt. 

## 2011-02-22 ENCOUNTER — Encounter: Payer: Self-pay | Admitting: *Deleted

## 2011-02-27 ENCOUNTER — Encounter: Payer: Self-pay | Admitting: Radiation Oncology

## 2011-02-27 ENCOUNTER — Ambulatory Visit
Admission: RE | Admit: 2011-02-27 | Discharge: 2011-02-27 | Disposition: A | Discharge status: Home / Self Care | Payer: Medicare Other | Source: Ambulatory Visit | Attending: Radiation Oncology | Admitting: Radiation Oncology

## 2011-02-27 VITALS — BP 146/75 | HR 52 | Temp 97.8°F | Wt 166.4 lb

## 2011-02-27 DIAGNOSIS — Z170 Estrogen receptor positive status [ER+]: Secondary | ICD-10-CM | POA: Insufficient documentation

## 2011-02-27 DIAGNOSIS — C50111 Malignant neoplasm of central portion of right female breast: Secondary | ICD-10-CM | POA: Insufficient documentation

## 2011-02-27 DIAGNOSIS — Z7982 Long term (current) use of aspirin: Secondary | ICD-10-CM | POA: Insufficient documentation

## 2011-02-27 DIAGNOSIS — C50119 Malignant neoplasm of central portion of unspecified female breast: Secondary | ICD-10-CM

## 2011-02-27 DIAGNOSIS — Z510 Encounter for antineoplastic radiation therapy: Secondary | ICD-10-CM | POA: Insufficient documentation

## 2011-02-27 DIAGNOSIS — C50919 Malignant neoplasm of unspecified site of unspecified female breast: Secondary | ICD-10-CM | POA: Insufficient documentation

## 2011-02-27 DIAGNOSIS — I10 Essential (primary) hypertension: Secondary | ICD-10-CM | POA: Insufficient documentation

## 2011-02-27 DIAGNOSIS — N6310 Unspecified lump in the right breast, unspecified quadrant: Secondary | ICD-10-CM

## 2011-02-27 DIAGNOSIS — Z807 Family history of other malignant neoplasms of lymphoid, hematopoietic and related tissues: Secondary | ICD-10-CM | POA: Insufficient documentation

## 2011-02-27 DIAGNOSIS — Z79899 Other long term (current) drug therapy: Secondary | ICD-10-CM | POA: Insufficient documentation

## 2011-02-27 HISTORY — DX: Malignant neoplasm of unspecified site of unspecified female breast: C50.919

## 2011-02-27 NOTE — Progress Notes (Signed)
Encounter addended by: Delynn Flavin, RN on: 02/27/2011  6:05 PM<BR>     Documentation filed: Inpatient Patient Education

## 2011-02-27 NOTE — Progress Notes (Signed)
Encounter addended by: Delynn Flavin, RN on: 02/27/2011  6:08 PM<BR>     Documentation filed: Inpatient Patient Education

## 2011-02-27 NOTE — Progress Notes (Signed)
[facility]  3 CHILDREN, ONE DECEASED.  6 CHILDREN AND 3 GREAT-GRANDCHILDREN  ACCOMPANIED BY HUSBAND OF 56 YEARS

## 2011-02-27 NOTE — Progress Notes (Signed)
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 female who self palpated a hard region in her breast in the middle of 2012. She had undergone mammography in 2010 but skipped her mammogram in 2011. She was found by radiology to have a red hard right nipple. She underwent a digital diagnostic mammogram on 11/16/2010 which showed retraction of the right nipple. The right nipple was hard and thickened and erythematous on physical exam. An ultrasound was performed and this showed mixed hypoechoic and isoechoic abnormality measuring 0.7 cm in the nipple just below the skin surface. The patient was seen by Dr. Luisa Hart. He obtained a punch biopsy ion 11/28/10 and this revealed invasive ductal carcinoma. There was a small amount of overlying epidermis which was not involved by carcinoma. The prognostic indicators revealed that it was ER 100% PR 69%. It was HER-2/neu negative.   Post biopsy, she underwent an MRI on 12-13-10 of her breasts and this showed asymmetric enhancement of the right nipple is a small amount of immediate subareolar tissue on the right. The area of enhancement was 1.5 x 2.2 x 1.4 cm. There were no enlarged internal mammary or axillary lymph nodes. The opposite breast appeared benign.   She then underwent a right lumpectomy and sentinel lymph node biopsy on 01/22/2011. This revealed grade 2 invasive ductal carcinoma with DCIS present. The margins were widely negative with greater than 1 cm margins for the invasive and in situ disease. There was no lymphovascular space invasion. The  tumor spanned 1.5 cm. The single sentinel node was negative.  The patient has recovered well from surgery. She sees Dr. Donnie Coffin tomorrow. She is in good health and she performs gardening and housecleaning regularly. She has hypothyroidism as well as hypertension and hyperlipidemia but she denies any history of MI stroke or diabetes. She has no known osteoporosis nor any known history of blood clots. She says she feels somewhat stiff in the mornings but moving around helps.  PREVIOUS RADIATION THERAPY: No   PAST MEDICAL HISTORY:  has a past medical history of Hypertension; Cataract; Leg laceration; Cancer; and Breast cancer.     PAST SURGICAL HISTORY:  Past Surgical History  Procedure Date  . Tubal ligation 1972  . Eye surgery      BILATERAL CATARACT SURGERY  . Breast surgery 11/28/10    SKIN-PUNCH BIOSPY RIGHT BREAST(NIPPLE), ER+, RP+, LOW S-PHASE, HER 2NEU NEGAITIVE  . Breast lumpectomy 01/22/11    RIGHT BREAST LUMPECTOMY WITH BIOSPY OF 1 SENTINEL NODE, INVASIVE GRADE II DUCTAL CARCINOMA , INTERMEDIATE GRADE DUCTAL CARCINOMA IN SITU , DERMAL SKIN INVOLVED, MARGINS NEGATIVE,( 0/1)  NODE POSITIVE, ER+, PR+, LOW s-PHASE, HER 2 NEU- NO AMPLIFICATION     FAMILY HISTORY: family history includes Heart attack in her father; Heart disease in her father; Kidney disease in her mother; Kidney failure in her mother; Lymphoma in her sister; and Pancreatitis in her sister.   SOCIAL HISTORY:  reports that she has never smoked. She has never used smokeless tobacco. She reports that she does not drink alcohol or use illicit  drugs.   ALLERGIES: Review of patient's allergies indicates no known allergies.   MEDICATIONS:  Current Outpatient Prescriptions  Medication Sig Dispense Refill  . aspirin EC 81 MG tablet Take 81 mg by mouth daily.        Marland Kitchen atenolol-chlorthalidone (TENORETIC) 50-25 MG per tablet Take 1 tablet by mouth daily.       . simvastatin (ZOCOR) 20 MG tablet Take 20 mg by mouth daily.        Marland Kitchen SYNTHROID 75 MCG tablet Take 75 mcg by mouth daily.           REVIEW OF SYSTEMS:  Pertinent items are noted in HPI.    PHYSICAL EXAM:  weight is 166 lb 6.4 oz (75.479 kg). Her temperature is 97.8 F (36.6 C). Her blood pressure is 146/75 and her pulse is 52.   General: Alert and oriented, in no acute distress HEENT: Head is normocephalic. Pupils are equally round and reactive to light. Extraocular movements are intact. Oropharynx is clear. Neck: Neck is supple, no palpable cervical or supraclavicular lymphadenopathy. Heart: Regular in rate and rhythm with no murmurs, rubs, or gallops. Chest: Clear to auscultation bilaterally, with no rhonchi, wheezes, or rales. Abdomen: Soft, nontender, nondistended, with no rigidity or guarding. Extremities: No cyanosis or edema. Lymphatics: No concerning lymphadenopathy. Skin: No concerning lesions. Musculoskeletal: symmetric strength and muscle tone throughout. Neurologic: Cranial nerves II through XII are grossly intact. No obvious focalities. Speech is fluent. Coordination is intact. Psychiatric: Judgment and insight are intact. Affect is appropriate. Breast Exam: The patient has a well-healed horizontal scar extending from the 3:00 to 9:00 position of the right breast. She no longer has a nipple. There are no concerning skin changes. There are no palpable lesions of concern in either breast. There is no palpable supraclavicular or axillary lymphadenopathy.    LABORATORY DATA:  Lab Results  Component Value Date   WBC 8.5 01/15/2011   HGB 13.9 01/15/2011   HCT 41.6 01/15/2011   MCV 92.4 01/15/2011   PLT 282 01/15/2011   Lab Results  Component Value Date   NA 137 01/15/2011   K 3.7 01/15/2011   CL 98 01/15/2011   CO2 30 01/15/2011   Lab Results  Component Value Date   ALT 13 01/15/2011   AST 17 01/15/2011   ALKPHOS 102 01/15/2011   BILITOT 0.5 01/15/2011    Imaging: as above  Pathology: as above   IMPRESSION/ PLAN: This is  a very pleasant 77 year old woman with ER PR positive HER-2 negative stage TI CN0M0 right breast cancer. She has wide margins and no lymphovascular space invasion.  I discussed the data from the [place] Journal of Medicine article by Dannial Monarch al which looked at elderly women with stage I estrogen receptor positive breast cancer. The findings of this trial demonstrated no difference in overall survival when women were compared to hormonal therapy alone versus hormonal therapy plus adjuvant radiotherapy. I explained that she has an excellent prognosis if she pursues hormonal therapy alone and she will be talking to Dr. Donnie Coffin about this tomorrow. If adjuvant radiation was used in addition to tamoxifen or an aromatase inhibitor, I would only anticipate a marginal benefit. I estimated that her risk of a local recurrence with hormonal therapy alone in the next 10 years would be about 8%; if radiotherapy he were to be added her risk would be about 2%. Her risk of dying from cancer would not improve with radiotherapy, however.  I explained to the  patient that if she declines hormonal therapy or if Dr. Donnie Coffin has any reservations as to whether she would be able to tolerate hormonal therapy for 5 years, then it would be a good idea for her to pursue radiotherapy. I would like for her to be referred back to me if this is the case. I've also given her my contact information she has any questions in the future and have encouraged her to have her daughter (who is a Engineer, civil (consulting) but cannot be here today) to call me if she has questions as well. Molly Lambert is appreciative and agreeable with this plan.

## 2011-02-27 NOTE — Progress Notes (Signed)
Encounter addended by: Epifania Littrell Mintz Ziv Welchel, RN on: 02/27/2011  5:38 PM<BR>     Documentation filed: Charges VN

## 2011-02-27 NOTE — Progress Notes (Signed)
Please see the Nurse Progress Note in the MD Initial Consult Encounter for this patient. 

## 2011-02-27 NOTE — Progress Notes (Signed)
Encounter addended by: Delynn Flavin, RN on: 02/27/2011  4:48 PM<BR>     Documentation filed: Charges VN

## 2011-02-28 ENCOUNTER — Other Ambulatory Visit: Payer: Self-pay | Admitting: *Deleted

## 2011-02-28 ENCOUNTER — Ambulatory Visit: Payer: Medicare Other

## 2011-02-28 ENCOUNTER — Other Ambulatory Visit (HOSPITAL_BASED_OUTPATIENT_CLINIC_OR_DEPARTMENT_OTHER): Payer: Medicare Other | Admitting: Lab

## 2011-02-28 ENCOUNTER — Ambulatory Visit (HOSPITAL_BASED_OUTPATIENT_CLINIC_OR_DEPARTMENT_OTHER): Payer: Medicare Other | Admitting: Oncology

## 2011-02-28 VITALS — BP 117/65 | HR 55 | Temp 97.8°F | Ht 63.5 in | Wt 166.6 lb

## 2011-02-28 DIAGNOSIS — C50919 Malignant neoplasm of unspecified site of unspecified female breast: Secondary | ICD-10-CM

## 2011-02-28 DIAGNOSIS — C50019 Malignant neoplasm of nipple and areola, unspecified female breast: Secondary | ICD-10-CM

## 2011-02-28 DIAGNOSIS — Z170 Estrogen receptor positive status [ER+]: Secondary | ICD-10-CM

## 2011-02-28 LAB — COMPREHENSIVE METABOLIC PANEL
ALT: 18 U/L (ref 0–35)
Alkaline Phosphatase: 93 U/L (ref 39–117)
Glucose, Bld: 95 mg/dL (ref 70–99)
Sodium: 138 mEq/L (ref 135–145)
Total Bilirubin: 0.3 mg/dL (ref 0.3–1.2)
Total Protein: 7.3 g/dL (ref 6.0–8.3)

## 2011-02-28 LAB — CBC WITH DIFFERENTIAL/PLATELET
BASO%: 0.5 % (ref 0.0–2.0)
LYMPH%: 31.1 % (ref 14.0–49.7)
MCH: 31.8 pg (ref 25.1–34.0)
MCHC: 34.2 g/dL (ref 31.5–36.0)
MCV: 92.9 fL (ref 79.5–101.0)
MONO%: 8.1 % (ref 0.0–14.0)
Platelets: 263 10*3/uL (ref 145–400)
RBC: 4.23 10*6/uL (ref 3.70–5.45)

## 2011-03-01 ENCOUNTER — Encounter: Payer: Self-pay | Admitting: *Deleted

## 2011-03-01 NOTE — Progress Notes (Signed)
Mailed after appt letter to pt. 

## 2011-03-01 NOTE — Progress Notes (Signed)
Referral  Dr Dory Horn; Dr T Cornett: Dr Colletta Maryland [phone]    Reason for Referral: Breast cancer  No chief complaint on file. palpable breast mass noted x 6months. She is a pleasant 77 yo woman with no significant past medical history, noted crusting of lt nipple ~ 6 months with some thickening of that nipple/areoala. She underwent mammography on 11/16/10, which showed eryhtema and thickening of the nipple. U/s showed mixed echogenicity of the nipple and a fairly superficial lesion. A punch biopsy of the area on 11/28/10 showed invasive cancer , Grade 2, er 100%, pr 69%, ki67 6%, her 2 1.33 A MRI of both breasts on 12/13/10, showed  Enhancement of the area measuring 1.5x 2.3x1.4 cm.  She underwent lumpectomy on 01/22/11, which showed a 1.5 cm G2 lesion with dermal involvement, 1 negative sentinel node and clear margins. HPI:  Past Medical History  Diagnosis Date  . Hypertension     NO CARDIAC MD,  [facility]  PCP  ,  URGENT MED  . Cataract   . Leg laceration     WITH SURGERY  . Cancer     rt breast  . Breast cancer   :  Past Surgical History  Procedure Date  . Tubal ligation 1972  . Eye surgery      BILATERAL CATARACT SURGERY  . Breast surgery 11/28/10    SKIN-PUNCH BIOSPY RIGHT BREAST(NIPPLE), ER+, RP+, LOW S-PHASE, HER 2NEU NEGAITIVE  . Breast lumpectomy 01/22/11    RIGHT BREAST LUMPECTOMY WITH BIOSPY OF 1 SENTINEL NODE, INVASIVE GRADE II DUCTAL CARCINOMA , INTERMEDIATE GRADE DUCTAL CARCINOMA IN SITU , DERMAL SKIN INVOLVED, MARGINS NEGATIVE,( 0/1)  NODE POSITIVE, ER+, PR+, LOW s-PHASE, HER 2 NEU- NO AMPLIFICATION  :  Current outpatient prescriptions:aspirin EC 81 MG tablet, Take 81 mg by mouth daily.  , Disp: , Rfl: ;  atenolol-chlorthalidone (TENORETIC) 50-25 MG per tablet, Take 1 tablet by mouth daily. , Disp: , Rfl: ;  simvastatin (ZOCOR) 20 MG tablet, Take 20 mg by mouth daily. , Disp: , Rfl: ;  SYNTHROID 75 MCG tablet, Take 75 mcg by mouth daily. , Disp: , Rfl:  :    :  No Known Allergies:  Family History  Problem Relation Age of Onset  . Kidney disease Mother     kidney failure   . Kidney failure Mother   . Heart disease Father     heart attack   . Heart attack Father   . Lymphoma Sister   . Pancreatitis Sister   :  History   Social History  . 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 Years of Education: N/A   Occupational History  . Not on file.   Social History Main Topics  . Smoking status: Never Smoker   . Smokeless tobacco: Never Used  . Alcohol Use: No     OCC.  . Drug Use: No  . Sexually Active: Yes    Birth Control/ Protection: Post-menopausal     G3, P3, MENARCHE, AGE 64, MENOSPUSE AGE 12, NO BC AND HRT X 10 YEARS   Other Topics Concern  . Not on file   Social History Narrative  . No narrative on file  :  A comprehensive review of systems was negative.  Exam: @IPVITALS @ General appearance: alert, cooperative and appears stated age Head: Normocephalic, without obvious abnormality, atraumatic Throat: lips, mucosa, and tongue normal; teeth and gums normal Neck: no adenopathy, no carotid bruit, no JVD, supple, symmetrical, trachea midline and thyroid not enlarged, symmetric, no tenderness/mass/nodules Resp: clear to auscultation bilaterally and normal percussion bilaterally Breasts: normal appearance, no masses or tenderness, positive findings: rt breast s/p lumpectomy; rt axilla -nl; lt breast/axilla -wnl Cardio: regular rate and rhythm, S1, S2 normal, no murmur, click, rub or gallop GI: soft, non-tender; bowel sounds normal; no masses,  no organomegaly Extremities: extremities normal, atraumatic, no cyanosis or edema   Basename 02/28/11 1548  WBC 7.6  HGB 13.4  HCT 39.3   PLT 263    Basename 02/28/11 1548  NA 138  K 3.7  CL 99  CO2 31  GLUCOSE 95  BUN 22  CREATININE 0.90  CALCIUM 9.8    Blood smear review: n/a  Pathology:IDC G2, er/pr+; low proliferative index  No results found.  Assessment and Plan:  Pleasant post menopausal woman with strongly er+ breast cancer. She has seen dr Basilio Cairo and was unsure of going through with xrt, but given her good PF, I felt that this would be prudent , especially given the close proximity of the tumor to skin. As far as systemic therapy is concerned she has agreed with anti-estrogen therapy. Theoretical benefit of chemotherapy in this setting is low and I would not send out an oncotype.  I have made arrangements for a  Bone density test before she returns , after completing xrt.    Total visit time 70 minutes; 50% of the time spent discussing treatment planning, biology of cancer and counselling.

## 2011-03-04 NOTE — Progress Notes (Signed)
Encounter addended by: Seila Liston Mintz Catheryn Slifer, RN on: 03/04/2011  5:50 PM<BR>     Documentation filed: Charges VN

## 2011-03-06 ENCOUNTER — Encounter: Payer: Self-pay | Admitting: Radiation Oncology

## 2011-03-06 ENCOUNTER — Ambulatory Visit
Admission: RE | Admit: 2011-03-06 | Discharge: 2011-03-06 | Disposition: A | Discharge status: Home / Self Care | Payer: Medicare Other | Source: Ambulatory Visit | Attending: Radiation Oncology | Admitting: Radiation Oncology

## 2011-03-06 NOTE — Progress Notes (Signed)
Met with patient to discuss RO billing.   Rad Tx; (29562 Extrl Beam) Dx: 174.9 Female Breast, NOS (excludes Skin of breast T-173.5)  Attending Rad: Dr. Basilio Cairo

## 2011-03-06 NOTE — Progress Notes (Signed)
Simulation treatment planning note  Molly Lambert has a history of right breast cancer. She is status post lumpectomy. She will receive whole breast radiotherapy. The patient was laid in the supine position on the treatment table with her arms over her head. Her head was in an Accuform device. I placed adhesive wiring over her lumpectomy scar and around the borders of her breast tissue . High-resolution CT axial imaging was obtained of the patient's chest. An isocenter was placed in her anterior right lung. Skin markings were made and she tolerated the procedure well without any complications.  Treatment planning note: the patient will be treated with opposed tangential fields using MLCs for custom blocks. I plan to prescribe 42.5 gray in 16 fractions to the whole breast.

## 2011-03-08 ENCOUNTER — Encounter (INDEPENDENT_AMBULATORY_CARE_PROVIDER_SITE_OTHER): Payer: Self-pay | Admitting: Surgery

## 2011-03-08 ENCOUNTER — Ambulatory Visit (INDEPENDENT_AMBULATORY_CARE_PROVIDER_SITE_OTHER): Payer: Medicare Other | Admitting: Surgery

## 2011-03-08 VITALS — BP 126/72 | HR 66 | Temp 96.9°F | Resp 18 | Ht 64.75 in | Wt 165.4 lb

## 2011-03-08 DIAGNOSIS — Z9889 Other specified postprocedural states: Secondary | ICD-10-CM

## 2011-03-08 NOTE — Progress Notes (Signed)
Molly Lambert    [id number] 03/08/2011    12-11-1934   CC: Post op Right central lumpectomy and R SLN   HPI: The patient returns for post op follow-up. She underwent a RIGHT CENTAL LUMPECTOMY AND sln mapping  on 01/22/11. Over all she feels that she is doing well.   PE: The incision is healing nicely and there is no evidence of infection or hematoma.  Marland Kitchen  DATA REVIEWED: Pathology report showed T1NOMX ER PR POS HER 2 NEU NEG STAGE 1  IMPRESSION: Patient doing well  PLAN: Her next visit will be in 3 MONTHs.  Radiation to start 1/17.

## 2011-03-08 NOTE — Patient Instructions (Signed)
Follow up 3 months

## 2011-03-13 ENCOUNTER — Encounter: Payer: Self-pay | Admitting: Radiation Oncology

## 2011-03-13 ENCOUNTER — Ambulatory Visit
Admission: RE | Admit: 2011-03-13 | Discharge: 2011-03-13 | Disposition: A | Discharge status: Home / Self Care | Payer: Medicare Other | Source: Ambulatory Visit | Attending: Radiation Oncology | Admitting: Radiation Oncology

## 2011-03-13 NOTE — Progress Notes (Signed)
VERIFICATION SIMULATION NOTE NARRATIVE: The patient was laid in the correct position on the treatment table for simulation verification. Portal imaging was obtained and I verified the fields and MLCs to be accurate. The patient tolerated the procedure well.

## 2011-03-14 ENCOUNTER — Ambulatory Visit
Admission: RE | Admit: 2011-03-14 | Discharge: 2011-03-14 | Disposition: A | Discharge status: Home / Self Care | Payer: Medicare Other | Source: Ambulatory Visit | Attending: Radiation Oncology | Admitting: Radiation Oncology

## 2011-03-15 ENCOUNTER — Ambulatory Visit: Payer: Medicare Other

## 2011-03-18 ENCOUNTER — Ambulatory Visit
Admission: RE | Admit: 2011-03-18 | Discharge: 2011-03-18 | Disposition: A | Discharge status: Home / Self Care | Payer: Medicare Other | Source: Ambulatory Visit | Attending: Radiation Oncology | Admitting: Radiation Oncology

## 2011-03-18 ENCOUNTER — Encounter: Payer: Self-pay | Admitting: Radiation Oncology

## 2011-03-18 DIAGNOSIS — N6310 Unspecified lump in the right breast, unspecified quadrant: Secondary | ICD-10-CM

## 2011-03-18 DIAGNOSIS — C50119 Malignant neoplasm of central portion of unspecified female breast: Secondary | ICD-10-CM

## 2011-03-18 MED ORDER — RADIAPLEXRX EX GEL
Freq: Once | CUTANEOUS | Status: AC
  Administered 2011-03-18: 11:00:00 via TOPICAL

## 2011-03-18 MED ORDER — ALRA NON-METALLIC DEODORANT (RAD-ONC): 1.0000 "application " | Freq: Once | TOPICAL | Status: DC

## 2011-03-18 NOTE — Progress Notes (Signed)
Post sim ed done; gave pt "Radiation and You" booklet, Radiaplex, Alra. All questions answered.

## 2011-03-18 NOTE — Progress Notes (Signed)
   Weekly Management Note Current Dose:  532 cGy  Projected Dose:  4256 cGy   Narrative:  The patient presents for routine under treatment assessment.  CBCT/MVCT images/Port film x-rays were reviewed.  The chart was checked. She is doing well without any complaints.  Physical Findings: Weight: 165 lb 6.4 oz (75.025 kg). She has no discernible skin changes thus far over her breast.  Impression:  The patient is tolerating radiotherapy.  Plan:  Continue radiotherapy as planned.

## 2011-03-19 ENCOUNTER — Ambulatory Visit
Admission: RE | Admit: 2011-03-19 | Discharge: 2011-03-19 | Disposition: A | Discharge status: Home / Self Care | Payer: Medicare Other | Source: Ambulatory Visit | Attending: Radiation Oncology | Admitting: Radiation Oncology

## 2011-03-20 ENCOUNTER — Ambulatory Visit
Admission: RE | Admit: 2011-03-20 | Discharge: 2011-03-20 | Disposition: A | Discharge status: Home / Self Care | Payer: Medicare Other | Source: Ambulatory Visit | Attending: Radiation Oncology | Admitting: Radiation Oncology

## 2011-03-21 ENCOUNTER — Ambulatory Visit
Admission: RE | Admit: 2011-03-21 | Discharge: 2011-03-21 | Disposition: A | Discharge status: Home / Self Care | Payer: Medicare Other | Source: Ambulatory Visit | Attending: Radiation Oncology | Admitting: Radiation Oncology

## 2011-03-21 NOTE — Progress Notes (Signed)
Encounter addended by: Glennie Hawk, RN on: 03/21/2011  9:13 AM<BR>     Documentation filed: Inpatient Patient Education

## 2011-03-22 ENCOUNTER — Ambulatory Visit
Admission: RE | Admit: 2011-03-22 | Discharge: 2011-03-22 | Disposition: A | Discharge status: Home / Self Care | Payer: Medicare Other | Source: Ambulatory Visit | Attending: Radiation Oncology | Admitting: Radiation Oncology

## 2011-03-25 ENCOUNTER — Encounter: Payer: Self-pay | Admitting: Radiation Oncology

## 2011-03-25 ENCOUNTER — Ambulatory Visit
Admission: RE | Admit: 2011-03-25 | Discharge: 2011-03-25 | Disposition: A | Discharge status: Home / Self Care | Payer: Medicare Other | Source: Ambulatory Visit | Attending: Radiation Oncology | Admitting: Radiation Oncology

## 2011-03-25 VITALS — BP 159/86 | HR 67 | Temp 97.0°F | Wt 164.5 lb

## 2011-03-25 DIAGNOSIS — C50119 Malignant neoplasm of central portion of unspecified female breast: Secondary | ICD-10-CM

## 2011-03-25 NOTE — Progress Notes (Signed)
   Weekly Management Note Current Dose:  1862  cGy  Projected Dose: 4256 cGy   Narrative:  The patient presents for routine under treatment assessment.  CBCT/MVCT images/Port film x-rays were reviewed.  The chart was checked. She is doing well. No complaints  Physical Findings: Weight: 164 lb 8 oz (74.617 kg).  R breast is mildly erythematous.  Impression:  The patient is tolerating radiotherapy.  Plan:  Continue radiotherapy as planned.

## 2011-03-25 NOTE — Progress Notes (Signed)
7/16 fractions to Right  Breast.  Denies any pain.  Erythema noted around the right breast and right axillary incisions.  Skin soft and intact. BP elevated today since Molly Lambert did not take her BP medication last evening.  She plans to take her medication this evening as originally ordered.

## 2011-03-26 ENCOUNTER — Ambulatory Visit
Admission: RE | Admit: 2011-03-26 | Discharge: 2011-03-26 | Disposition: A | Discharge status: Home / Self Care | Payer: Medicare Other | Source: Ambulatory Visit | Attending: Oncology | Admitting: Oncology

## 2011-03-26 ENCOUNTER — Ambulatory Visit
Admission: RE | Admit: 2011-03-26 | Discharge: 2011-03-26 | Disposition: A | Discharge status: Home / Self Care | Payer: Medicare Other | Source: Ambulatory Visit | Attending: Radiation Oncology | Admitting: Radiation Oncology

## 2011-03-26 ENCOUNTER — Other Ambulatory Visit: Payer: Medicare Other

## 2011-03-26 DIAGNOSIS — C50919 Malignant neoplasm of unspecified site of unspecified female breast: Secondary | ICD-10-CM

## 2011-03-27 ENCOUNTER — Ambulatory Visit
Admission: RE | Admit: 2011-03-27 | Discharge: 2011-03-27 | Disposition: A | Discharge status: Home / Self Care | Payer: Medicare Other | Source: Ambulatory Visit | Attending: Radiation Oncology | Admitting: Radiation Oncology

## 2011-03-28 ENCOUNTER — Ambulatory Visit
Admission: RE | Admit: 2011-03-28 | Discharge: 2011-03-28 | Disposition: A | Discharge status: Home / Self Care | Payer: Medicare Other | Source: Ambulatory Visit | Attending: Radiation Oncology | Admitting: Radiation Oncology

## 2011-03-29 ENCOUNTER — Ambulatory Visit (HOSPITAL_BASED_OUTPATIENT_CLINIC_OR_DEPARTMENT_OTHER): Payer: Medicare Other | Admitting: Oncology

## 2011-03-29 ENCOUNTER — Ambulatory Visit
Admission: RE | Admit: 2011-03-29 | Discharge: 2011-03-29 | Disposition: A | Discharge status: Home / Self Care | Payer: Medicare Other | Source: Ambulatory Visit | Attending: Radiation Oncology | Admitting: Radiation Oncology

## 2011-03-29 DIAGNOSIS — M899 Disorder of bone, unspecified: Secondary | ICD-10-CM

## 2011-03-29 DIAGNOSIS — E559 Vitamin D deficiency, unspecified: Secondary | ICD-10-CM

## 2011-03-29 DIAGNOSIS — C50919 Malignant neoplasm of unspecified site of unspecified female breast: Secondary | ICD-10-CM

## 2011-03-29 DIAGNOSIS — Z170 Estrogen receptor positive status [ER+]: Secondary | ICD-10-CM

## 2011-03-29 DIAGNOSIS — M949 Disorder of cartilage, unspecified: Secondary | ICD-10-CM

## 2011-03-29 MED ORDER — ANASTROZOLE 1 MG PO TABS: 1.0000 mg | ORAL_TABLET | Freq: Every day | ORAL | Status: AC

## 2011-03-29 NOTE — Patient Instructions (Signed)

## 2011-03-29 NOTE — Progress Notes (Signed)
Hematology and Oncology Follow Up Visit  Molly Lambert [id number] [birth year] 77 y.o. 03/29/2011 10:34 AM PCP  Dr Audria Nine Dr Basilio Cairo  Principle Diagnosis: 77 yo woman with hx of T1CN0, ER/PR + s/p lumpectomy 12/12, currently on xrt.  Interim History:  There have been no intercurrent illness, hospitalizations or medication changes.  Medications: I have reviewed the patient's current medications.  Allergies: No Known Allergies  Past Medical History, Surgical history, Social history, and Family History were reviewed and updated.  Review of Systems: Constitutional:  Negative for fever, chills, night sweats, anorexia, weight loss, pain. Cardiovascular: no chest pain or dyspnea on exertion Respiratory: no cough, shortness of breath, or wheezing Neurological: negative Dermatological: negative ENT: negative Skin Gastrointestinal: no abdominal pain, change in bowel habits, or black or bloody stools Genito-Urinary: no dysuria, trouble voiding, or hematuria Hematological and Lymphatic: negative Breast: negative for breast lumps Musculoskeletal: negative Remaining ROS negative.  Physical Exam: Blood pressure 160/80, pulse 58, temperature 98.1 F (36.7 C), temperature source Oral, height 5' 4.75" (1.645 m), weight 165 lb 11.2 oz (75.161 kg). ECOG: 0 General appearance: alert, cooperative and appears stated age Head: Normocephalic, without obvious abnormality, atraumatic Neck: no adenopathy, no carotid bruit, no JVD, supple, symmetrical, trachea midline and thyroid not enlarged, symmetric, no tenderness/mass/nodules Lymph nodes: Cervical, supraclavicular, and axillary nodes normal. Cardiac : regular rate and rhythm, no murmurs or gallops Pulmonary:clear to auscultation bilaterally Breasts: inspection negative, no nipple discharge or bleeding, no masses or nodularity palpable Abdomen:soft, non-tender; bowel sounds normal; no masses,  no organomegaly Extremities negative Neuro: alert,  oriented, normal speech, no focal findings or movement disorder noted  Lab Results: Lab Results  Component Value Date   WBC 7.6 02/28/2011   HGB 13.4 02/28/2011   HCT 39.3 02/28/2011   MCV 92.9 02/28/2011   PLT 263 02/28/2011     Chemistry      Component Value Date/Time   NA 138 02/28/2011 1548   K 3.7 02/28/2011 1548   CL 99 02/28/2011 1548   CO2 31 02/28/2011 1548   BUN 22 02/28/2011 1548   CREATININE 0.90 02/28/2011 1548      Component Value Date/Time   CALCIUM 9.8 02/28/2011 1548   ALKPHOS 93 02/28/2011 1548   AST 19 02/28/2011 1548   ALT 18 02/28/2011 1548   BILITOT 0.3 02/28/2011 1548      .pathology. Radiological Studies: chest X-ray n/a Mammogram n/a Bone density Wnl, mild osteopenia  Impression and Plan:  77 yo woman with hx of  er/pr+ breast cancer due to complete xrt next week. I have discussed AI therapy with her and have given her a script for this. I also reviewed s/e with her and recommneded Vit D supplementation.  More than 50% of the visit was spent in patient-related counselling   Pierce Crane, MD 2/1/201310:34 AM

## 2011-04-01 ENCOUNTER — Ambulatory Visit
Admission: RE | Admit: 2011-04-01 | Discharge: 2011-04-01 | Disposition: A | Discharge status: Home / Self Care | Payer: Medicare Other | Source: Ambulatory Visit | Attending: Radiation Oncology | Admitting: Radiation Oncology

## 2011-04-01 ENCOUNTER — Encounter: Payer: Self-pay | Admitting: Radiation Oncology

## 2011-04-01 VITALS — BP 163/92 | HR 92 | Temp 97.2°F | Wt 164.9 lb

## 2011-04-01 DIAGNOSIS — C50119 Malignant neoplasm of central portion of unspecified female breast: Secondary | ICD-10-CM

## 2011-04-01 NOTE — Progress Notes (Signed)
Ms. Gloster reports increased fatigue since start of treatment.  She states she has always taken a nap in the afternoon but she reports longer nap time since the start of radiation.  Erythema noted right breast.  Denies any pain or tenderness presently.  Skin intact and soft.  BP Elevated today but comparable to last BP on 03/29/11.

## 2011-04-01 NOTE — Progress Notes (Signed)
   Weekly Management Note Current Dose:  3192 cGy  Projected Dose: 4256 cGy   Narrative:  The patient presents for routine under treatment assessment.  CBCT/MVCT images/Port film x-rays were reviewed.  The chart was checked. She is doing well without complaints other than some fatigue.  Physical Findings: Weight: 164 lb 14.4 oz (74.798 kg).  Filed Vitals:   04/01/11 1024  BP: 163/92  Pulse: 92  Temp: 97.2 F (36.2 C)  Weight: 164 lb 14.4 oz (74.798 kg)  Her right breast is erythematous but the skin is intact.  Impression:  The patient is tolerating radiotherapy.  Plan:  Continue radiotherapy as planned. Her blood pressure is a little bit elevated. She did take her antihypertensives this morning . I Have written down her blood pressure over her past 2 visits so that she can share this with her primary doctor.

## 2011-04-02 ENCOUNTER — Ambulatory Visit
Admission: RE | Admit: 2011-04-02 | Discharge: 2011-04-02 | Disposition: A | Discharge status: Home / Self Care | Payer: Medicare Other | Source: Ambulatory Visit | Attending: Radiation Oncology | Admitting: Radiation Oncology

## 2011-04-03 ENCOUNTER — Ambulatory Visit
Admission: RE | Admit: 2011-04-03 | Discharge: 2011-04-03 | Disposition: A | Discharge status: Home / Self Care | Payer: Medicare Other | Source: Ambulatory Visit | Attending: Radiation Oncology | Admitting: Radiation Oncology

## 2011-04-04 ENCOUNTER — Ambulatory Visit
Admission: RE | Admit: 2011-04-04 | Discharge: 2011-04-04 | Disposition: A | Discharge status: Home / Self Care | Payer: Medicare Other | Source: Ambulatory Visit | Attending: Radiation Oncology | Admitting: Radiation Oncology

## 2011-04-05 ENCOUNTER — Encounter: Payer: Self-pay | Admitting: Radiation Oncology

## 2011-04-05 ENCOUNTER — Ambulatory Visit
Admission: RE | Admit: 2011-04-05 | Discharge: 2011-04-05 | Disposition: A | Discharge status: Home / Self Care | Payer: Medicare Other | Source: Ambulatory Visit | Attending: Radiation Oncology | Admitting: Radiation Oncology

## 2011-04-05 ENCOUNTER — Ambulatory Visit: Payer: Medicare Other

## 2011-04-05 VITALS — BP 175/93 | HR 56 | Temp 97.7°F

## 2011-04-05 DIAGNOSIS — C50119 Malignant neoplasm of central portion of unspecified female breast: Secondary | ICD-10-CM

## 2011-04-05 MED ORDER — RADIAPLEXRX EX GEL
Freq: Once | CUTANEOUS | Status: AC
  Administered 2011-04-05: 1 via TOPICAL

## 2011-04-05 NOTE — Progress Notes (Signed)
   Weekly Management Note  Narrative:  The patient presents for routine under treatment assessment.  CBCT/MVCT images/Port film x-rays were reviewed.  The chart was checked. The patient was seen today after her final 16th fraction of radiotherapy. She is doing well. She has minimal skin irritation. She is feeling some fatigue.  Physical Findings:  Filed Vitals:   04/05/11 0941 04/05/11 0944 04/05/11 0956  BP: 213/91 173/84 175/93  Pulse: 59 54 56  Temp: 97.7 F (36.5 C)     She has diffuse erythema over her right breast. The skin is intact. She is in no acute distress.  Impression:  The patient has tolerated treatment.  Plan:  We'll see her back for followup in one month. I've advised her to get in touch with her primary doctor as her blood pressure has been running high. She demonstrates understanding of this.

## 2011-04-05 NOTE — Progress Notes (Signed)
[facility] Radiation Oncology  Name:Molly Lambert  Date:04/05/2011           WUJ:[id number] DOB:Feb 29, 1934   Status:outpatient    DIAGNOSIS: Pathologic T1 c N0 M0 grade 2 invasive ductal carcinoma of the right central breast, ER positive    INDICATION FOR TREATMENT: Curative   TREATMENT DATES:  03/14/2011 to 04/05/2011                        SITE/DOSE:             Right breast/ 4256 cGy in 16 fractions   BEAMS/ENERGY:     Tangents, forward plan/6 MV photons           NARRATIVE:              The patient tolerated her treatment beautifully with mild skin irritation.   PLAN: Routine followup in one month. Patient instructed to call if questions or worsening complaints in interim.

## 2011-04-05 NOTE — Progress Notes (Signed)
Encounter addended by: Delynn Flavin, RN on: 04/05/2011  6:29 PM<BR>     Documentation filed: Visit Diagnoses, Inpatient MAR, Orders

## 2011-04-05 NOTE — Progress Notes (Signed)
Mild Erythema noted in treatment field.  C/o intermittent itching and burning at incision, but states applying Radiaplex gel relieves this discomfort.  Denies any pain presently.   Reports energy level is low as she indicated by a Thumbs down sign.  Currently BP elevated.  Pt advised by Dr. Basilio Cairo to call her [facility] Physician to address this.  She stated she would call today.  Given copy of Vital signs to report to her PC     completres radiation today.  16/16 fractions to Right Breast.

## 2011-04-08 ENCOUNTER — Ambulatory Visit: Payer: Medicare Other

## 2011-04-09 ENCOUNTER — Ambulatory Visit: Payer: Medicare Other

## 2011-04-10 ENCOUNTER — Ambulatory Visit: Payer: Medicare Other

## 2011-04-11 ENCOUNTER — Ambulatory Visit: Payer: Medicare Other

## 2011-04-12 ENCOUNTER — Ambulatory Visit: Payer: Medicare Other

## 2011-04-15 ENCOUNTER — Ambulatory Visit: Payer: Medicare Other

## 2011-04-16 ENCOUNTER — Ambulatory Visit: Payer: Medicare Other

## 2011-04-17 ENCOUNTER — Ambulatory Visit: Payer: Medicare Other

## 2011-04-18 ENCOUNTER — Encounter: Payer: Self-pay | Admitting: Gastroenterology

## 2011-04-18 ENCOUNTER — Ambulatory Visit: Payer: Medicare Other

## 2011-04-19 ENCOUNTER — Ambulatory Visit: Payer: Medicare Other

## 2011-04-22 ENCOUNTER — Ambulatory Visit: Payer: Medicare Other

## 2011-04-23 ENCOUNTER — Ambulatory Visit: Payer: Medicare Other

## 2011-04-24 ENCOUNTER — Ambulatory Visit: Payer: Medicare Other

## 2011-04-25 ENCOUNTER — Ambulatory Visit: Payer: Medicare Other

## 2011-04-26 ENCOUNTER — Ambulatory Visit: Payer: Medicare Other

## 2011-04-29 ENCOUNTER — Ambulatory Visit: Payer: Medicare Other

## 2011-05-09 ENCOUNTER — Encounter: Payer: Self-pay | Admitting: *Deleted

## 2011-05-10 ENCOUNTER — Ambulatory Visit
Admission: RE | Admit: 2011-05-10 | Discharge: 2011-05-10 | Disposition: A | Discharge status: Home / Self Care | Payer: Medicare Other | Source: Ambulatory Visit | Attending: Radiation Oncology | Admitting: Radiation Oncology

## 2011-05-10 ENCOUNTER — Encounter: Payer: Self-pay | Admitting: Radiation Oncology

## 2011-05-10 VITALS — BP 139/76 | HR 65 | Temp 97.2°F | Wt 166.3 lb

## 2011-05-10 DIAGNOSIS — C50119 Malignant neoplasm of central portion of unspecified female breast: Secondary | ICD-10-CM

## 2011-05-10 NOTE — Progress Notes (Signed)
FU today.  Some fatigue. Continues her normal daily nap.  Denies any pain.  Right breast with skin intact.  Mild hyerpigmentation noted top of right  breast when compared to the left breast.  1 month out from Radiation Therapy.

## 2011-05-10 NOTE — Progress Notes (Signed)
[facility] Radiation Oncology Follow up Note  Name: Molly Lambert   Date: 05/10/2011    MRN: [id number] DOB: 1934-08-06  CC:  [name], MD, MD  Maurice March, MD  DIAGNOSIS: T1 C. 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  NARRATIVE: The patient returns for followup today. She is doing well. She has no complaints. She is started her aromatase inhibitor under the care of Dr. Donnie Coffin and is tolerating that so far. She sees Dr. Luisa Hart in April and Dr. Donnie Coffin in May.  MEDICATIONS:  Current Outpatient Prescriptions  Medication Sig Dispense Refill  . aspirin EC 81 MG tablet Take 81 mg by mouth daily.        Marland Kitchen atenolol-chlorthalidone (TENORETIC) 50-25 MG per tablet Take 1 tablet by mouth daily.       . simvastatin (ZOCOR) 20 MG tablet Take 20 mg by mouth daily.       Marland Kitchen SYNTHROID 75 MCG tablet Take 75 mcg by mouth daily.           PHYSICAL EXAM:   weight is 166 lb 4.8 oz (75.433 kg). Her temperature is 97.2 F (36.2 C). Her blood pressure is 139/76 and her pulse is 65.  She is in no acute distress. Her right breast has healed well from radiotherapy. She has some residual hyperpigmentation in the upper inner quadrant.   LABORATORY DATA:  Lab Results  Component Value Date   WBC 7.6 02/28/2011   HGB 13.4 02/28/2011   HCT 39.3 02/28/2011   MCV 92.9 02/28/2011   PLT 263 02/28/2011   Lab Results  Component Value Date   NA 138 02/28/2011   K 3.7 02/28/2011   CL 99 02/28/2011   CO2 31 02/28/2011   Lab Results  Component Value Date   ALT 18 02/28/2011   AST 19 02/28/2011   ALKPHOS 93 02/28/2011   BILITOT 0.3 02/28/2011       IMPRESSION/PLAN: She is doing well approximately one month post-radiotherapy. I recommended vitaminE cream over her breast for healing. She also said she likes to apply baby oil on her breast - that  should be fine as well .  I reminded her that her prognosis is excellent. I will see her back on a when necessary basis.

## 2011-06-07 ENCOUNTER — Encounter (INDEPENDENT_AMBULATORY_CARE_PROVIDER_SITE_OTHER): Payer: Self-pay | Admitting: Surgery

## 2011-06-07 ENCOUNTER — Ambulatory Visit (INDEPENDENT_AMBULATORY_CARE_PROVIDER_SITE_OTHER): Payer: Medicare Other | Admitting: Surgery

## 2011-06-07 VITALS — BP 168/92 | HR 64 | Temp 97.5°F | Ht 64.5 in | Wt 168.4 lb

## 2011-06-07 DIAGNOSIS — Z853 Personal history of malignant neoplasm of breast: Secondary | ICD-10-CM

## 2011-06-07 NOTE — Progress Notes (Signed)
NAME: Molly Lambert       DOB: 1934-05-30           DATE: 06/07/2011       MRN: [id number]   [name] is a 77 y.o.Marland Kitchenfemale who presents for routine followup of her Right breast cancer stage 1diagnosed in  11/2010 and treated with central lumpectomy and SLN mapping.. She has no problems or concerns on either side.  PFSH: She has had no significant changes since the last visit here.  ROS: There have been no significant changes since the last visit here  EXAM: General: The patient is alert, oriented, generally healty appearing, NAD. Mood and affect are normal.  Breasts:  Right breast scar central duct   Lymphatics: She has no axillary or supraclavicular adenopathy on either side.  Extremities: Full ROM of the surgical side with no lymphedema noted.  Data Reviewed:   Impression: Doing well, with no evidence of recurrent cancer or new cancer  Plan: Will continue to follow up on an  biannual basis here.

## 2011-06-07 NOTE — Patient Instructions (Signed)
Return in 6 months

## 2011-06-21 ENCOUNTER — Telehealth: Payer: Self-pay | Admitting: Oncology

## 2011-06-21 NOTE — Telephone Encounter (Signed)
lmonvm adviisng the pt of her r/s appts from 06/26/2011 to 07/29/2011 due to the md is out of the office.

## 2011-06-26 ENCOUNTER — Ambulatory Visit: Payer: Medicare Other | Admitting: Oncology

## 2011-06-26 ENCOUNTER — Other Ambulatory Visit: Payer: Medicare Other | Admitting: Lab

## 2011-06-28 ENCOUNTER — Telehealth: Payer: Self-pay | Admitting: Oncology

## 2011-06-28 NOTE — Telephone Encounter (Signed)
lmonvm adviising the pt that her 07/29/2011 appts are cancelled and r/s to 08/12/2011

## 2011-07-29 ENCOUNTER — Ambulatory Visit: Payer: Medicare Other | Admitting: Oncology

## 2011-07-29 ENCOUNTER — Other Ambulatory Visit: Payer: Medicare Other | Admitting: Lab

## 2011-08-12 ENCOUNTER — Telehealth: Payer: Self-pay | Admitting: *Deleted

## 2011-08-12 ENCOUNTER — Ambulatory Visit (HOSPITAL_BASED_OUTPATIENT_CLINIC_OR_DEPARTMENT_OTHER): Payer: Medicare Other | Admitting: Oncology

## 2011-08-12 ENCOUNTER — Other Ambulatory Visit (HOSPITAL_BASED_OUTPATIENT_CLINIC_OR_DEPARTMENT_OTHER): Payer: Medicare Other | Admitting: Lab

## 2011-08-12 VITALS — BP 160/82 | HR 65 | Temp 98.0°F | Ht 64.5 in | Wt 162.8 lb

## 2011-08-12 DIAGNOSIS — M899 Disorder of bone, unspecified: Secondary | ICD-10-CM

## 2011-08-12 DIAGNOSIS — E559 Vitamin D deficiency, unspecified: Secondary | ICD-10-CM

## 2011-08-12 DIAGNOSIS — C50919 Malignant neoplasm of unspecified site of unspecified female breast: Secondary | ICD-10-CM

## 2011-08-12 DIAGNOSIS — Z170 Estrogen receptor positive status [ER+]: Secondary | ICD-10-CM

## 2011-08-12 LAB — CBC WITH DIFFERENTIAL/PLATELET
Basophils Absolute: 0 10*3/uL (ref 0.0–0.1)
EOS%: 1.3 % (ref 0.0–7.0)
Eosinophils Absolute: 0.1 10*3/uL (ref 0.0–0.5)
HGB: 13.5 g/dL (ref 11.6–15.9)
NEUT#: 2.8 10*3/uL (ref 1.5–6.5)
RDW: 13.6 % (ref 11.2–14.5)
lymph#: 1.1 10*3/uL (ref 0.9–3.3)

## 2011-08-12 NOTE — Telephone Encounter (Signed)
made  patient appointment for 02-11-2012 starting at 10:30am with labs made patient appointment for mammogram at the breast center 11-18-2011 at 10:30am

## 2011-08-12 NOTE — Progress Notes (Signed)
Hematology and Oncology Follow Up Visit  Molly Lambert [id number] 1934/02/29 77 y.o. 08/12/2011 12:09 PM PCP  Dr Audria Nine Dr Basilio Cairo  Principle Diagnosis: 77 yo woman with hx of T1CN0, ER/PR + s/p lumpectomy 12/12, status post patient radiation 2013, with 16 fractions given. Interim History:  There have been no intercurrent illness, hospitalizations or medication changes.  Medications: I have reviewed the patient's current medications.  Allergies: No Known Allergies  Past Medical History, Surgical history, Social history, and Family History were reviewed and updated.  Review of Systems: Constitutional:  Negative for fever, chills, night sweats, anorexia, weight loss, pain. Cardiovascular: no chest pain or dyspnea on exertion Respiratory: no cough, shortness of breath, or wheezing Neurological: negative Dermatological: negative ENT: negative Skin Gastrointestinal: no abdominal pain, change in bowel habits, or black or bloody stools Genito-Urinary: no dysuria, trouble voiding, or hematuria Hematological and Lymphatic: negative Breast: negative for breast lumps Musculoskeletal: negative Remaining ROS negative.  Physical Exam: Blood pressure 160/82, pulse 65, temperature 98 F (36.7 C), height 5' 4.5" (1.638 m), weight 162 lb 12.8 oz (73.846 kg). ECOG: 0 General appearance: alert, cooperative and appears stated age Head: Normocephalic, without obvious abnormality, atraumatic Neck: no adenopathy, no carotid bruit, no JVD, supple, symmetrical, trachea midline and thyroid not enlarged, symmetric, no tenderness/mass/nodules Lymph nodes: Cervical, supraclavicular, and axillary nodes normal. Cardiac : regular rate and rhythm, no murmurs or gallops Pulmonary:clear to auscultation bilaterally Breasts: inspection negative, no nipple discharge or bleeding, no masses or nodularity palpable Abdomen:soft, non-tender; bowel sounds normal; no masses,  no organomegaly Extremities  negative Neuro: alert, oriented, normal speech, no focal findings or movement disorder noted  Lab Results: Lab Results  Component Value Date   WBC 4.4 08/12/2011   HGB 13.5 08/12/2011   HCT 40.6 08/12/2011   MCV 95.4 08/12/2011   PLT 253 08/12/2011     Chemistry      Component Value Date/Time   NA 138 02/28/2011 1548   K 3.7 02/28/2011 1548   CL 99 02/28/2011 1548   CO2 31 02/28/2011 1548   BUN 22 02/28/2011 1548   CREATININE 0.90 02/28/2011 1548      Component Value Date/Time   CALCIUM 9.8 02/28/2011 1548   ALKPHOS 93 02/28/2011 1548   AST 19 02/28/2011 1548   ALT 18 02/28/2011 1548   BILITOT 0.3 02/28/2011 1548      .pathology. Radiological Studies: chest X-ray n/a Mammogram N/a due in September Bone density Wnl, mild osteopenia  Impression and Plan:  77 yo woman with hx of  er/pr+ breast cancer, status post lumpectomy and radiation completed February of 2013, currently on Arimidex. She is tolerating the medicine well. I have scheduled her followup mammogram. I will see her in 6 months. More than 50% of the visit was spent in patient-related counselling   Pierce Crane, MD 6/17/201312:09 PM  I

## 2011-08-13 LAB — COMPREHENSIVE METABOLIC PANEL
AST: 16 U/L (ref 0–37)
Albumin: 4 g/dL (ref 3.5–5.2)
BUN: 18 mg/dL (ref 6–23)
Calcium: 9.7 mg/dL (ref 8.4–10.5)
Chloride: 101 mEq/L (ref 96–112)
Glucose, Bld: 101 mg/dL — ABNORMAL HIGH (ref 70–99)
Potassium: 3.8 mEq/L (ref 3.5–5.3)
Total Protein: 6.5 g/dL (ref 6.0–8.3)

## 2011-08-15 ENCOUNTER — Telehealth: Payer: Self-pay | Admitting: *Deleted

## 2011-08-15 MED ORDER — VITAMIN D 1000 UNITS PO TABS: 1000.0000 [IU] | ORAL_TABLET | Freq: Every day | ORAL | Status: AC

## 2011-08-15 NOTE — Telephone Encounter (Signed)
Called patient to have her increase her vitamin D to 2000mg  daily.

## 2011-08-22 ENCOUNTER — Encounter: Payer: Self-pay | Admitting: Family Medicine

## 2011-08-22 ENCOUNTER — Ambulatory Visit (INDEPENDENT_AMBULATORY_CARE_PROVIDER_SITE_OTHER): Payer: Medicare Other | Admitting: Family Medicine

## 2011-08-22 VITALS — BP 146/67 | HR 55 | Temp 97.9°F | Resp 16 | Ht 64.0 in | Wt 163.8 lb

## 2011-08-22 DIAGNOSIS — E039 Hypothyroidism, unspecified: Secondary | ICD-10-CM

## 2011-08-22 DIAGNOSIS — E7800 Pure hypercholesterolemia, unspecified: Secondary | ICD-10-CM

## 2011-08-22 DIAGNOSIS — M25559 Pain in unspecified hip: Secondary | ICD-10-CM

## 2011-08-22 DIAGNOSIS — M25552 Pain in left hip: Secondary | ICD-10-CM

## 2011-08-22 LAB — LDL CHOLESTEROL, DIRECT: Direct LDL: 131 mg/dL — ABNORMAL HIGH

## 2011-08-22 LAB — T4, FREE: Free T4: 1.1 ng/dL (ref 0.80–1.80)

## 2011-08-22 MED ORDER — LEVOTHYROXINE SODIUM 75 MCG PO TABS: 75.0000 ug | ORAL_TABLET | Freq: Every day | ORAL | Status: DC

## 2011-08-22 MED ORDER — ATENOLOL-CHLORTHALIDONE 50-25 MG PO TABS: 1.0000 | ORAL_TABLET | Freq: Every day | ORAL | Status: DC

## 2011-08-22 MED ORDER — SIMVASTATIN 20 MG PO TABS: 20.0000 mg | ORAL_TABLET | Freq: Every day | ORAL | Status: DC

## 2011-08-22 NOTE — Patient Instructions (Signed)
You have Vitamin D deficiency. Get Nature Made Vitamin D3  2000 IU and take one capsule daily.    Vitamin D Deficiency  Not having enough vitamin D is called a deficiency. Your body needs this vitamin to keep your bones strong and healthy. Having too little of it can make your bones soft or can cause other health problems.  HOME CARE  Take all vitamins, herbs, or nutrition drinks (supplements) as told by your doctor.   Have your blood tested 2 months after taking vitamins, herbs, or nutrition drinks.   Eat foods that have vitamin D. This includes:   Dairy products, cereals, or juices with added vitamin D. Check the label.   Fatty fish like salmon or trout.   Eggs.   Oysters.   Go outside for 10 to 15 minutes when the sun is shining. Do this 3 times a week. Do not do this if you have skin cancer.   Do not use tanning beds.   Stay at a healthy weight. Lose weight if needed.   Keep all doctor visits as told.  GET HELP IF:  You have questions.   You continue to have problems.   You feel sick to your stomach (nauseous) or throw up (vomit).   You cannot go poop (constipated).   You feel confused.   You have severe belly (abdominal) or back pain.  MAKE SURE YOU:  Understand these instructions.   Will watch your condition.   Will get help right away if you are not doing well or get worse.  Document Released: 01/31/2011 Document Reviewed: 01/29/2011 [facility] Patient Information 2012 [place], [place].

## 2011-08-22 NOTE — Progress Notes (Signed)
  Subjective:    Patient ID: Molly Lambert, female    DOB: [birth year], 77 y.o.   MRN: [id number]  HPI This 77 y.o. Cauc female is in treatment for breast cancer and recently diagnosed Vit D def.  She feels good but BP fluctuates - pt is asymptomatic. Only c/o left hip pain today; denies trauma  or misstep. Has a cane at home which helps with ambulation. She is taking Arimidex and Vit D supple-  mentation as recommended by Oncologist.    Review of Systems  Constitutional: Negative for fever, appetite change, fatigue and unexpected weight change.  Respiratory: Negative for chest tightness and shortness of breath.   Cardiovascular: Negative for chest pain, palpitations and leg swelling.  Musculoskeletal: Positive for arthralgias and gait problem. Negative for back pain and joint swelling.  Neurological: Negative for dizziness, weakness, light-headedness and numbness.       Objective:   Physical Exam  Nursing note and vitals reviewed. Constitutional: She is oriented to person, place, and time. She appears well-developed and well-nourished. No distress.  HENT:  Head: Normocephalic and atraumatic.  Eyes: Conjunctivae and EOM are normal. No scleral icterus.  Cardiovascular: Normal rate.   Pulmonary/Chest: Effort normal. No respiratory distress.  Musculoskeletal: Normal range of motion. She exhibits tenderness. She exhibits no edema.       Left hip: minimal discomfort with palpation of hip joint ove  lateral aspect and posteriorly. Good ROM without crepitus. Pt can bear weight on each leg independently.  Neurological: She is alert and oriented to person, place, and time. No cranial nerve deficit. Coordination normal.       Gait is normal.          Assessment & Plan:   1. Hypothyroidism - last TSH (08/23/10)= 2.197 TSH, T4, Free  2. Hypercholesteremia -last LDL, direct (08/23/10)= 121 LDL Cholesterol, Direct  3. Hip pain, left - suspect sacroiliitis Monitor; symptomatic treatment and  modify activities

## 2011-08-23 NOTE — Progress Notes (Signed)
Quick Note:  Please call pt and advise that the following labs are abnormal... Thyroid tests are normal so continue to take your current dose of thyroid medication. Your LDL ("bad") cholesterol is above normal. Try to eat as healthy as possible- more fruits, vegetables and whole grains. Avoid fried foods, processed ("junk") and high fat/ high cholesterol foods. Try to stay active.  Copy to pt. ______

## 2011-08-24 ENCOUNTER — Encounter: Payer: Self-pay | Admitting: Radiology

## 2011-08-25 ENCOUNTER — Encounter: Payer: Self-pay | Admitting: Family Medicine

## 2011-08-25 DIAGNOSIS — E039 Hypothyroidism, unspecified: Secondary | ICD-10-CM | POA: Insufficient documentation

## 2011-08-25 DIAGNOSIS — E7800 Pure hypercholesterolemia, unspecified: Secondary | ICD-10-CM | POA: Insufficient documentation

## 2011-08-25 DIAGNOSIS — I10 Essential (primary) hypertension: Secondary | ICD-10-CM | POA: Insufficient documentation

## 2011-12-11 ENCOUNTER — Encounter: Payer: Self-pay | Admitting: Gastroenterology

## 2011-12-13 ENCOUNTER — Ambulatory Visit
Admission: RE | Admit: 2011-12-13 | Discharge: 2011-12-13 | Disposition: A | Discharge status: Home / Self Care | Payer: Medicare Other | Source: Ambulatory Visit | Attending: Oncology | Admitting: Oncology

## 2011-12-13 ENCOUNTER — Other Ambulatory Visit: Payer: Self-pay | Admitting: Oncology

## 2011-12-13 DIAGNOSIS — Z853 Personal history of malignant neoplasm of breast: Secondary | ICD-10-CM

## 2011-12-13 DIAGNOSIS — R921 Mammographic calcification found on diagnostic imaging of breast: Secondary | ICD-10-CM

## 2011-12-13 DIAGNOSIS — C50919 Malignant neoplasm of unspecified site of unspecified female breast: Secondary | ICD-10-CM

## 2011-12-13 DIAGNOSIS — E559 Vitamin D deficiency, unspecified: Secondary | ICD-10-CM

## 2011-12-20 ENCOUNTER — Ambulatory Visit (INDEPENDENT_AMBULATORY_CARE_PROVIDER_SITE_OTHER): Payer: Medicare Other | Admitting: Surgery

## 2011-12-23 ENCOUNTER — Other Ambulatory Visit: Payer: Self-pay | Admitting: Oncology

## 2011-12-23 ENCOUNTER — Ambulatory Visit
Admission: RE | Admit: 2011-12-23 | Discharge: 2011-12-23 | Disposition: A | Discharge status: Home / Self Care | Payer: Medicare Other | Source: Ambulatory Visit | Attending: Oncology | Admitting: Oncology

## 2011-12-23 DIAGNOSIS — R921 Mammographic calcification found on diagnostic imaging of breast: Secondary | ICD-10-CM

## 2011-12-24 ENCOUNTER — Ambulatory Visit
Admission: RE | Admit: 2011-12-24 | Discharge: 2011-12-24 | Disposition: A | Discharge status: Home / Self Care | Payer: Medicare Other | Source: Ambulatory Visit | Attending: Oncology | Admitting: Oncology

## 2011-12-24 DIAGNOSIS — R921 Mammographic calcification found on diagnostic imaging of breast: Secondary | ICD-10-CM

## 2011-12-27 ENCOUNTER — Other Ambulatory Visit (INDEPENDENT_AMBULATORY_CARE_PROVIDER_SITE_OTHER): Payer: Self-pay | Admitting: Surgery

## 2011-12-27 ENCOUNTER — Encounter (INDEPENDENT_AMBULATORY_CARE_PROVIDER_SITE_OTHER): Payer: Self-pay | Admitting: Surgery

## 2011-12-27 ENCOUNTER — Ambulatory Visit (INDEPENDENT_AMBULATORY_CARE_PROVIDER_SITE_OTHER): Payer: Medicare Other | Admitting: Surgery

## 2011-12-27 VITALS — BP 132/60 | HR 68 | Temp 97.1°F | Resp 20 | Ht 64.5 in | Wt 163.0 lb

## 2011-12-27 DIAGNOSIS — Z853 Personal history of malignant neoplasm of breast: Secondary | ICD-10-CM

## 2011-12-27 DIAGNOSIS — R920 Mammographic microcalcification found on diagnostic imaging of breast: Secondary | ICD-10-CM

## 2011-12-27 NOTE — Patient Instructions (Signed)
Lumpectomy, Breast Conserving Surgery A lumpectomy is breast surgery that removes only part of the breast. Another name used may be partial mastectomy. The amount removed varies. Make sure you understand how much of your breast will be removed. Reasons for a lumpectomy:  Any solid breast mass.  Grouped significant nodularity that may be confused with a solitary breast mass. Lumpectomy is the most common form of breast cancer surgery today. The surgeon removes the portion of your breast which contains the tumor (cancer). This is the lump. Some normal tissue around the lump is also removed to be sure that all the tumor has been removed.  If cancer cells are found in the margins where the breast tissue was removed, your surgeon will do more surgery to remove the remaining cancer tissue. This is called re-excision surgery. Radiation and/or chemotherapy treatments are often given following a lumpectomy to kill any cancer cells that could possibly remain.  REASONS YOU MAY NOT BE ABLE TO HAVE BREAST CONSERVING SURGERY:  The tumor is located in more than one place.  Your breast is small and the tumor is large so the breast would be disfigured.  The entire tumor removal is not successful with a lumpectomy.  You cannot commit to a full course of chemotherapy, radiation therapy or are pregnant and cannot have radiation.  You have previously had radiation to the breast to treat cancer. HOW A LUMPECTOMY IS PERFORMED If overnight nursing is not required following a biopsy, a lumpectomy can be performed as a same-day surgery. This can be done in a hospital, clinic, or surgical center. The anesthesia used will depend on your surgeon. They will discuss this with you. A general anesthetic keeps you sleeping through the procedure. LET YOUR CAREGIVERS KNOW ABOUT THE FOLLOWING:  Allergies  Medications taken including herbs, eye drops, over the counter medications, and creams.  Use of steroids (by mouth or  creams)  Previous problems with anesthetics or Novocaine.  Possibility of pregnancy, if this applies  History of blood clots (thrombophlebitis)  History of bleeding or blood problems.  Previous surgery  Other health problems BEFORE THE PROCEDURE You should be present one hour prior to your procedure unless directed otherwise.  AFTER THE PROCEDURE  After surgery, you will be taken to the recovery area where a nurse will watch and check your progress. Once you're awake, stable, and taking fluids well, barring other problems you will be allowed to go home.  Ice packs applied to your operative site may help with discomfort and keep the swelling down.  A small rubber drain may be placed in the breast for a couple of days to prevent a hematoma from developing in the breast.  A pressure dressing may be applied for 24 to 48 hours to prevent bleeding.  Keep the wound dry.  You may resume a normal diet and activities as directed. Avoid strenuous activities affecting the arm on the side of the biopsy site such as tennis, swimming, heavy lifting (more than 10 pounds) or pulling.  Bruising in the breast is normal following this procedure.  Wearing a bra - even to bed - may be more comfortable and also help keep the dressing on.  Change dressings as directed.  Only take over-the-counter or prescription medicines for pain, discomfort, or fever as directed by your caregiver. Call for your results as instructed by your surgeon. Remember it is your responsibility to get the results of your lumpectomy if your surgeon asked you to follow-up. Do not assume   everything is fine if you have not heard from your caregiver. SEEK MEDICAL CARE IF:   There is increased bleeding (more than a small spot) from the wound.  You notice redness, swelling, or increasing pain in the wound.  Pus is coming from wound.  An unexplained oral temperature above 102 F (38.9 C) develops.  You notice a foul smell  coming from the wound or dressing. SEEK IMMEDIATE MEDICAL CARE IF:   You develop a rash.  You have difficulty breathing.  You have any allergic problems. Document Released: 03/25/2006 Document Revised: 05/06/2011 Document Reviewed: 06/26/2006 [employer] Patient Information 2013 [employer].  

## 2011-12-27 NOTE — Progress Notes (Signed)
Patient ID: Molly Lambert, female   DOB: 12/25/1934, 77 y.o.   MRN: [id number]  Chief Complaint  Patient presents with  . Pre-op Exam    Br ck - new atypical ductal hyperplasia    HPI Molly Lambert is a 77 y.o. female.   HPI 77 yo woman with hx of T1CN0, ER/PR + s/p lumpectomy 12/12, status post patient radiation 2013, with 16 fractions given found to have new right breast microcalcifications core biopsy proven to be ADH. No other complaints .Excision recommended.   Past Medical History  Diagnosis Date  . Hypertension     NO CARDIAC MD,  [facility]  PCP  ,  URGENT MED  . Cataract   . Leg laceration     WITH SURGERY  . Cancer     rt breast  . Breast cancer   . S/P radiation therapy 03/14/11 - 04/05/11    Right Breast / 4256 cGy in 16 Fractions    Past Surgical History  Procedure Date  . Tubal ligation 1972  . Eye surgery      BILATERAL CATARACT SURGERY  . Breast surgery 11/28/10    SKIN-PUNCH BIOSPY RIGHT BREAST(NIPPLE), ER+, RP+, LOW S-PHASE, HER 2NEU NEGAITIVE  . Breast lumpectomy 01/22/11    RIGHT BREAST LUMPECTOMY WITH BIOSPY OF 1 SENTINEL NODE, INVASIVE GRADE II DUCTAL CARCINOMA , INTERMEDIATE GRADE DUCTAL CARCINOMA IN SITU , DERMAL SKIN INVOLVED, MARGINS NEGATIVE,( 0/1)  NODE POSITIVE, ER+, PR+, LOW s-PHASE, HER 2 NEU- NO AMPLIFICATION    Family History  Problem Relation Age of Onset  . Kidney disease Mother     kidney failure   . Kidney failure Mother   . Heart disease Father     heart attack   . Heart attack Father   . Lymphoma Sister   . Pancreatitis Sister     Social History History  Substance Use Topics  . Smoking status: Never Smoker   . Smokeless tobacco: Never Used  . Alcohol Use: No     OCC.    No Known Allergies  Current Outpatient Prescriptions  Medication Sig Dispense Refill  . anastrozole (ARIMIDEX) 1 MG tablet       . aspirin EC 81 MG tablet Take 81 mg by mouth daily.        . atenolol-chlorthalidone (TENORETIC) 50-25 MG per tablet Take 1  tablet by mouth daily.  90 tablet  3  . cholecalciferol (VITAMIN D) 1000 UNITS tablet Take 1 tablet (1,000 Units total) by mouth daily.  60 tablet  3  . levothyroxine (SYNTHROID) 75 MCG tablet Take 1 tablet (75 mcg total) by mouth daily.  30 tablet  5  . simvastatin (ZOCOR) 20 MG tablet Take 1 tablet (20 mg total) by mouth daily.  30 tablet  5    Review of Systems Review of Systems  Constitutional: Negative for fever, chills and unexpected weight change.  HENT: Negative for hearing loss, congestion, sore throat, trouble swallowing and voice change.   Eyes: Negative for visual disturbance.  Respiratory: Negative for cough and wheezing.   Cardiovascular: Negative for chest pain, palpitations and leg swelling.  Gastrointestinal: Negative for nausea, vomiting, abdominal pain, diarrhea, constipation, blood in stool, abdominal distention and anal bleeding.  Genitourinary: Negative for hematuria, vaginal bleeding and difficulty urinating.  Musculoskeletal: Negative for arthralgias.  Skin: Negative for rash and wound.  Neurological: Negative for seizures, syncope and headaches.  Hematological: Negative for adenopathy. Does not bruise/bleed easily.  Psychiatric/Behavioral: Negative for confusion.      Blood pressure 132/60, pulse 68, temperature 97.1 F (36.2 C), temperature source Temporal, resp. rate 20, height 5' 4.5" (1.638 m), weight 163 lb (73.936 kg).  Physical Exam Physical Exam  Constitutional: She is oriented to person, place, and time. She appears well-developed and well-nourished.  HENT:  Head: Normocephalic and atraumatic.  Eyes: Conjunctivae normal are normal. Pupils are equal, round, and reactive to light.  Neck: Normal range of motion.  Cardiovascular: Normal rate and regular rhythm.   Pulmonary/Chest: Effort normal and breath sounds normal. Right breast exhibits no mass, no skin change and no tenderness. Left breast exhibits no inverted nipple, no mass, no nipple discharge, no  skin change and no tenderness.    Musculoskeletal: Normal range of motion.  Lymphadenopathy:    She has no cervical adenopathy.    She has no axillary adenopathy.  Neurological: She is alert and oriented to person, place, and time.  Skin: Skin is warm and dry.  Psychiatric: She has a normal mood and affect. Her behavior is normal. Judgment and thought content normal.    Data Reviewed   History: The patient has a history of a malignant lumpectomy of  the right breast with radiation therapy in 2012/2013. She has  found to have suspicious calcifications located superiorly within  the right breast on diagnostic mammography and stereotactic core  biopsy was performed.  Exam: The biopsy site is clean and dry without hematoma formation.  Pathology: The pathology demonstrates atypical ductal hyperplasia  with calcifications and fibrocystic changes.  Assessment and Plan: The pathologic findings were discussed with  the patient and her questions were answered. Pathology is  concordant with imaging findings. Surgical excision of the area is  recommended. The patient is scheduled to see Dr. Julieana Eshleman on  12/27/2011. Post biopsy wound care instructions were reviewed with  the patient. The patient was encouraged to call the Breast Center  for additional questions or concerns.    Assessment    77 yo woman with hx of T1CN0, ER/PR + s/p lumpectomy 12/12, status post patient radiation 2013, with 16 fractions given New right breast microcalcifications and ADH by core biopsy. History of right breast cancer stage 1    Plan    Right breast partial mastectomy with NL.The procedure has been discussed with the patient. Alternatives to surgery have been discussed with the patient.  Risks of surgery include bleeding,  Infection,  Seroma formation, death,  and the need for further surgery.   The patient understands and wishes to proceed.       Tessie Ordaz A. 12/27/2011, 10:13 AM    

## 2012-01-06 ENCOUNTER — Telehealth (INDEPENDENT_AMBULATORY_CARE_PROVIDER_SITE_OTHER): Payer: Self-pay

## 2012-01-06 ENCOUNTER — Other Ambulatory Visit (INDEPENDENT_AMBULATORY_CARE_PROVIDER_SITE_OTHER): Payer: Self-pay

## 2012-01-06 MED ORDER — MUPIROCIN CALCIUM 2 % NA OINT: TOPICAL_OINTMENT | Freq: Two times a day (BID) | NASAL | Status: DC

## 2012-01-06 NOTE — Telephone Encounter (Signed)
Pt is calling to request an antibiotic ointment that she can put in her nose prior to surgery.  She has hx of MRSA and would like this Rx sent to [facility].

## 2012-01-06 NOTE — Telephone Encounter (Signed)
bactroban ointment  Per pharmacy

## 2012-01-06 NOTE — Telephone Encounter (Signed)
I put this medication in system. It cannot be e perscibed so needs to be called into pharmacy. I tried calling patient to see what pharmacy she wants it called into since [facility] is unsure if [facility] was the correct one but no answer. She does not have VM set up so I could not leave message. If patient calls back please call this into pharmacy of her choice.

## 2012-01-06 NOTE — Telephone Encounter (Signed)
Bactroban ointment called to [facility].  Pt notified.

## 2012-01-08 ENCOUNTER — Other Ambulatory Visit: Payer: Self-pay

## 2012-01-08 ENCOUNTER — Encounter (HOSPITAL_BASED_OUTPATIENT_CLINIC_OR_DEPARTMENT_OTHER)
Admission: RE | Admit: 2012-01-08 | Discharge: 2012-01-08 | Disposition: A | Discharge status: Home / Self Care | Payer: Medicare Other | Source: Ambulatory Visit | Attending: Surgery | Admitting: Surgery

## 2012-01-08 ENCOUNTER — Encounter (HOSPITAL_BASED_OUTPATIENT_CLINIC_OR_DEPARTMENT_OTHER): Payer: Self-pay | Admitting: *Deleted

## 2012-01-08 LAB — COMPREHENSIVE METABOLIC PANEL
CO2: 28 mEq/L (ref 19–32)
Calcium: 9.5 mg/dL (ref 8.4–10.5)
Chloride: 101 mEq/L (ref 96–112)
Creatinine, Ser: 0.78 mg/dL (ref 0.50–1.10)
GFR calc Af Amer: >90 mL/min (ref >90)
GFR calc non Af Amer: 80 mL/min — ABNORMAL LOW (ref >90)
Glucose, Bld: 99 mg/dL (ref 70–99)
Total Bilirubin: 0.4 mg/dL (ref 0.3–1.2)

## 2012-01-08 LAB — CBC
Hemoglobin: 12.9 g/dL (ref 12.0–15.0)
MCH: 31.7 pg (ref 26.0–34.0)
MCHC: 34.5 g/dL (ref 30.0–36.0)
Platelets: 248 10*3/uL (ref 150–400)
RBC: 4.07 MIL/uL (ref 3.87–5.11)

## 2012-01-08 NOTE — Progress Notes (Signed)
ekg reviewed by dr fitzgerald and cleared

## 2012-01-10 ENCOUNTER — Ambulatory Visit
Admission: RE | Admit: 2012-01-10 | Discharge: 2012-01-10 | Disposition: A | Discharge status: Home / Self Care | Payer: Medicare Other | Source: Ambulatory Visit | Attending: Surgery | Admitting: Surgery

## 2012-01-10 ENCOUNTER — Ambulatory Visit (HOSPITAL_BASED_OUTPATIENT_CLINIC_OR_DEPARTMENT_OTHER)
Admission: RE | Admit: 2012-01-10 | Discharge: 2012-01-10 | Disposition: A | Discharge status: Home / Self Care | Payer: Medicare Other | Source: Ambulatory Visit | Attending: Surgery | Admitting: Surgery

## 2012-01-10 ENCOUNTER — Ambulatory Visit (HOSPITAL_BASED_OUTPATIENT_CLINIC_OR_DEPARTMENT_OTHER): Payer: Medicare Other | Admitting: Anesthesiology

## 2012-01-10 ENCOUNTER — Encounter (HOSPITAL_BASED_OUTPATIENT_CLINIC_OR_DEPARTMENT_OTHER): Payer: Self-pay | Admitting: Anesthesiology

## 2012-01-10 ENCOUNTER — Encounter (HOSPITAL_BASED_OUTPATIENT_CLINIC_OR_DEPARTMENT_OTHER)
Admission: RE | Disposition: A | Discharge status: Home / Self Care | Payer: Self-pay | Source: Ambulatory Visit | Attending: Surgery

## 2012-01-10 ENCOUNTER — Encounter (HOSPITAL_BASED_OUTPATIENT_CLINIC_OR_DEPARTMENT_OTHER): Payer: Self-pay

## 2012-01-10 DIAGNOSIS — R920 Mammographic microcalcification found on diagnostic imaging of breast: Secondary | ICD-10-CM

## 2012-01-10 DIAGNOSIS — Z923 Personal history of irradiation: Secondary | ICD-10-CM | POA: Insufficient documentation

## 2012-01-10 DIAGNOSIS — N6089 Other benign mammary dysplasias of unspecified breast: Secondary | ICD-10-CM | POA: Insufficient documentation

## 2012-01-10 DIAGNOSIS — Z01810 Encounter for preprocedural cardiovascular examination: Secondary | ICD-10-CM | POA: Insufficient documentation

## 2012-01-10 DIAGNOSIS — Z01812 Encounter for preprocedural laboratory examination: Secondary | ICD-10-CM | POA: Insufficient documentation

## 2012-01-10 DIAGNOSIS — I10 Essential (primary) hypertension: Secondary | ICD-10-CM | POA: Insufficient documentation

## 2012-01-10 DIAGNOSIS — E7800 Pure hypercholesterolemia, unspecified: Secondary | ICD-10-CM

## 2012-01-10 DIAGNOSIS — C50919 Malignant neoplasm of unspecified site of unspecified female breast: Secondary | ICD-10-CM

## 2012-01-10 DIAGNOSIS — N6019 Diffuse cystic mastopathy of unspecified breast: Secondary | ICD-10-CM

## 2012-01-10 DIAGNOSIS — Z853 Personal history of malignant neoplasm of breast: Secondary | ICD-10-CM

## 2012-01-10 DIAGNOSIS — N6310 Unspecified lump in the right breast, unspecified quadrant: Secondary | ICD-10-CM

## 2012-01-10 DIAGNOSIS — E039 Hypothyroidism, unspecified: Secondary | ICD-10-CM

## 2012-01-10 DIAGNOSIS — C50119 Malignant neoplasm of central portion of unspecified female breast: Secondary | ICD-10-CM

## 2012-01-10 HISTORY — PX: PARTIAL MASTECTOMY WITH NEEDLE LOCALIZATION: SHX6008

## 2012-01-10 SURGERY — PARTIAL MASTECTOMY WITH NEEDLE LOCALIZATION
Anesthesia: General | Site: Breast | Laterality: Right | Wound class: Clean

## 2012-01-10 MED ORDER — PROPOFOL 10 MG/ML IV BOLUS
INTRAVENOUS | Status: DC | PRN
  Administered 2012-01-10: 200 mg via INTRAVENOUS

## 2012-01-10 MED ORDER — MIDAZOLAM HCL 5 MG/5ML IJ SOLN
INTRAMUSCULAR | Status: DC | PRN
  Administered 2012-01-10: 2 mg via INTRAVENOUS

## 2012-01-10 MED ORDER — HYDROCODONE-ACETAMINOPHEN 5-325 MG PO TABS: 1.0000 | ORAL_TABLET | Freq: Four times a day (QID) | ORAL | Status: DC | PRN

## 2012-01-10 MED ORDER — FENTANYL CITRATE 0.05 MG/ML IJ SOLN: 50.0000 ug | Freq: Once | INTRAMUSCULAR | Status: DC

## 2012-01-10 MED ORDER — DEXAMETHASONE SODIUM PHOSPHATE 4 MG/ML IJ SOLN
INTRAMUSCULAR | Status: DC | PRN
  Administered 2012-01-10: 10 mg via INTRAVENOUS

## 2012-01-10 MED ORDER — LACTATED RINGERS IV SOLN
INTRAVENOUS | Status: DC
Start: 2012-01-10 — End: 2012-01-10
  Administered 2012-01-10: 10 mL/h via INTRAVENOUS
  Administered 2012-01-10: 15:00:00 via INTRAVENOUS

## 2012-01-10 MED ORDER — DEXTROSE 5 % IV SOLN
3.0000 g | INTRAVENOUS | Status: AC
  Administered 2012-01-10: 3 g via INTRAVENOUS

## 2012-01-10 MED ORDER — CHLORHEXIDINE GLUCONATE 4 % EX LIQD: 1.0000 "application " | Freq: Once | CUTANEOUS | Status: DC

## 2012-01-10 MED ORDER — EPHEDRINE SULFATE 50 MG/ML IJ SOLN
INTRAMUSCULAR | Status: DC | PRN
  Administered 2012-01-10: 15 mg via INTRAVENOUS

## 2012-01-10 MED ORDER — MIDAZOLAM HCL 2 MG/2ML IJ SOLN: 1.0000 mg | INTRAMUSCULAR | Status: DC | PRN

## 2012-01-10 MED ORDER — ACETAMINOPHEN 10 MG/ML IV SOLN
INTRAVENOUS | Status: DC | PRN
  Administered 2012-01-10: 1000 mg via INTRAVENOUS

## 2012-01-10 MED ORDER — BUPIVACAINE-EPINEPHRINE 0.25% -1:200000 IJ SOLN
INTRAMUSCULAR | Status: DC | PRN
  Administered 2012-01-10: 18 mL

## 2012-01-10 MED ORDER — FENTANYL CITRATE 0.05 MG/ML IJ SOLN
INTRAMUSCULAR | Status: DC | PRN
  Administered 2012-01-10: 50 ug via INTRAVENOUS

## 2012-01-10 MED ORDER — LIDOCAINE HCL (CARDIAC) 20 MG/ML IV SOLN
INTRAVENOUS | Status: DC | PRN
  Administered 2012-01-10: 75 mg via INTRAVENOUS

## 2012-01-10 SURGICAL SUPPLY — 40 items
BLADE SURG 15 STRL LF DISP TIS (BLADE) ×1 IMPLANT
BLADE SURG 15 STRL SS (BLADE) ×1
CANISTER SUCTION 1200CC (MISCELLANEOUS) ×2 IMPLANT
CHLORAPREP W/TINT 26ML (MISCELLANEOUS) ×2 IMPLANT
CLIP TI WIDE RED SMALL 6 (CLIP) IMPLANT
CLOTH BEACON ORANGE TIMEOUT ST (SAFETY) IMPLANT
COVER MAYO STAND STRL (DRAPES) ×2 IMPLANT
COVER TABLE BACK 60X90 (DRAPES) ×2 IMPLANT
DECANTER SPIKE VIAL GLASS SM (MISCELLANEOUS) ×2 IMPLANT
DERMABOND ADVANCED (GAUZE/BANDAGES/DRESSINGS) ×1
DERMABOND ADVANCED .7 DNX12 (GAUZE/BANDAGES/DRESSINGS) ×1 IMPLANT
DEVICE DUBIN W/COMP PLATE 8390 (MISCELLANEOUS) ×2 IMPLANT
DRAPE LAPAROSCOPIC ABDOMINAL (DRAPES) IMPLANT
DRAPE PED LAPAROTOMY (DRAPES) ×2 IMPLANT
DRAPE UTILITY XL STRL (DRAPES) ×2 IMPLANT
ELECT COATED BLADE 2.86 ST (ELECTRODE) ×2 IMPLANT
ELECT REM PT RETURN 9FT ADLT (ELECTROSURGICAL) ×2
ELECTRODE REM PT RTRN 9FT ADLT (ELECTROSURGICAL) ×1 IMPLANT
GLOVE BIOGEL PI IND STRL 8 (GLOVE) ×1 IMPLANT
GLOVE BIOGEL PI INDICATOR 8 (GLOVE) ×1
GLOVE ECLIPSE 8.0 STRL XLNG CF (GLOVE) ×2 IMPLANT
GOWN PREVENTION PLUS XLARGE (GOWN DISPOSABLE) ×4 IMPLANT
KIT MARKER MARGIN INK (KITS) IMPLANT
NEEDLE HYPO 25X1 1.5 SAFETY (NEEDLE) ×2 IMPLANT
NS IRRIG 1000ML POUR BTL (IV SOLUTION) ×2 IMPLANT
PACK BASIN DAY SURGERY FS (CUSTOM PROCEDURE TRAY) ×2 IMPLANT
PENCIL BUTTON HOLSTER BLD 10FT (ELECTRODE) ×2 IMPLANT
SLEEVE SCD COMPRESS KNEE MED (MISCELLANEOUS) ×2 IMPLANT
SPONGE LAP 4X18 X RAY DECT (DISPOSABLE) ×2 IMPLANT
STAPLER VISISTAT 35W (STAPLE) IMPLANT
SUT MON AB 4-0 PC3 18 (SUTURE) ×2 IMPLANT
SUT SILK 2 0 SH (SUTURE) IMPLANT
SUT VIC AB 3-0 SH 27 (SUTURE) ×1
SUT VIC AB 3-0 SH 27X BRD (SUTURE) ×1 IMPLANT
SYR CONTROL 10ML LL (SYRINGE) ×2 IMPLANT
TOWEL OR 17X24 6PK STRL BLUE (TOWEL DISPOSABLE) ×2 IMPLANT
TOWEL OR NON WOVEN STRL DISP B (DISPOSABLE) ×2 IMPLANT
TUBE CONNECTING 20X1/4 (TUBING) ×2 IMPLANT
WATER STERILE IRR 1000ML POUR (IV SOLUTION) IMPLANT
YANKAUER SUCT BULB TIP NO VENT (SUCTIONS) ×2 IMPLANT

## 2012-01-10 NOTE — Op Note (Signed)
Preoperative diagnosis: Right breast ADH  Postop diagnosis: Same  Procedure:  RIGHT Partial mastectomy with wire localization  Surgeon: Harriette Bouillon M.D.  Anesthesia: LMA with 0.25% Sensorcaine local  EBL: Less than 40 cc  Specimen: Breast mass with wire and clip verified by radiography to pathology  Drains: None  Indications for procedure: The patient presents with a breast mass and ADH with history of lumpectomy for stage 1 breast cancer 11 year ago. Core biopsy showed it to be consistent with ADH . Options of mastectomy versus breast conservation were discussed. The patient was to proceed with RIGHT  partial mastectomy with wire localization.  Description of procedure: The patient was seen in the holding area and the appropriate side was marked. Questions are answered. Wire localization was done the radiology. The patient was taken back to the operating room and placed supine on the operating room table. After induction of general anesthesia, chest and upper  Torso were prepped and draped in a sterile fashion. Timeout was done and she received preoperative antibiotics. Curvilinear incision was made around the wire insertion site in the superior right breast. . All tissue around the wire was excised and hemostasis was achieved with cautery. The area was removed in its entirety upon gross examination. Gross margin negative. Radiograph revealed the mass, wire and clip to be in the specimen. The wound was closed in layers using 3-0 Vicryl and 4-0 Monocryl subcuticular stitch. Dermabond applied. All final counts found to be correct. Patient awoke extubated taken recovery in satisfactory condition.

## 2012-01-10 NOTE — Anesthesia Preprocedure Evaluation (Signed)
Anesthesia Evaluation  Patient identified by MRN, date of birth, ID band Patient awake    Reviewed: Allergy & Precautions, H&P , NPO status , Patient's Chart, lab work & pertinent test results, reviewed documented beta blocker date and time   History of Anesthesia Complications (+) PONV  Airway Mallampati: I TM Distance: >3 FB Neck ROM: Full    Dental  (+) Teeth Intact   Pulmonary neg pulmonary ROS,  breath sounds clear to auscultation        Cardiovascular hypertension (took tenoretic @ 4am today), Pt. on home beta blockers Rhythm:Regular Rate:Normal - Systolic murmurs    Neuro/Psych negative neurological ROS     GI/Hepatic negative GI ROS, Neg liver ROS,   Endo/Other  Hypothyroidism   Renal/GU negative Renal ROS     Musculoskeletal   Abdominal   Peds  Hematology   Anesthesia Other Findings   Reproductive/Obstetrics                           Anesthesia Physical Anesthesia Plan  ASA: II  Anesthesia Plan: General   Post-op Pain Management:    Induction: Intravenous  Airway Management Planned: LMA  Additional Equipment:   Intra-op Plan:   Post-operative Plan: Extubation in OR  Informed Consent: I have reviewed the patients History and Physical, chart, labs and discussed the procedure including the risks, benefits and alternatives for the proposed anesthesia with the patient or authorized representative who has indicated his/her understanding and acceptance.     Plan Discussed with: CRNA and Surgeon  Anesthesia Plan Comments:         Anesthesia Quick Evaluation

## 2012-01-10 NOTE — Anesthesia Procedure Notes (Signed)
Procedure Name: LMA Insertion Date/Time: 01/10/2012 3:30 PM Performed by: Zenia Resides D Pre-anesthesia Checklist: Patient identified, Emergency Drugs available, Suction available and Patient being monitored Patient Re-evaluated:Patient Re-evaluated prior to inductionOxygen Delivery Method: Circle System Utilized Preoxygenation: Pre-oxygenation with 100% oxygen Intubation Type: IV induction Ventilation: Mask ventilation without difficulty LMA: LMA inserted LMA Size: 4.0 Number of attempts: 1 Airway Equipment and Method: bite block Placement Confirmation: positive ETCO2 and breath sounds checked- equal and bilateral Tube secured with: Tape Dental Injury: Teeth and Oropharynx as per pre-operative assessment

## 2012-01-10 NOTE — Interval H&P Note (Signed)
History and Physical Interval Note:  01/10/2012 2:36 PM  Molly Lambert  has presented today for surgery, with the diagnosis of atypical ductal hyperplasia  The various methods of treatment have been discussed with the patient and family. After consideration of risks, benefits and other options for treatment, the patient has consented to  Procedure(s) (LRB) with comments: PARTIAL MASTECTOMY WITH NEEDLE LOCALIZATION (Right) - right breast needle localized partial mastectomy as a surgical intervention .  The patient's history has been reviewed, patient examined, no change in status, stable for surgery.  I have reviewed the patient's chart and labs.  Questions were answered to the patient's satisfaction.     [name]

## 2012-01-10 NOTE — H&P (View-Only) (Signed)
Patient ID: Sharren Bridge, female   DOB: 1934/09/08, 77 y.o.   MRN: [id number]  Chief Complaint  Patient presents with  . Pre-op Exam    Br ck - new atypical ductal hyperplasia    HPI Molly Lambert is a 77 y.o. female.   HPI 77 yo woman with hx of T1CN0, ER/PR + s/p lumpectomy 12/12, status post patient radiation 2013, with 16 fractions given found to have new right breast microcalcifications core biopsy proven to be ADH. No other complaints .Excision recommended.   Past Medical History  Diagnosis Date  . Hypertension     NO CARDIAC MD,  [facility]  PCP  ,  URGENT MED  . Cataract   . Leg laceration     WITH SURGERY  . Cancer     rt breast  . Breast cancer   . S/P radiation therapy 03/14/11 - 04/05/11    Right Breast / 4256 cGy in 16 Fractions    Past Surgical History  Procedure Date  . Tubal ligation 1972  . Eye surgery      BILATERAL CATARACT SURGERY  . Breast surgery 11/28/10    SKIN-PUNCH BIOSPY RIGHT BREAST(NIPPLE), ER+, RP+, LOW S-PHASE, HER 2NEU NEGAITIVE  . Breast lumpectomy 01/22/11    RIGHT BREAST LUMPECTOMY WITH BIOSPY OF 1 SENTINEL NODE, INVASIVE GRADE II DUCTAL CARCINOMA , INTERMEDIATE GRADE DUCTAL CARCINOMA IN SITU , DERMAL SKIN INVOLVED, MARGINS NEGATIVE,( 0/1)  NODE POSITIVE, ER+, PR+, LOW s-PHASE, HER 2 NEU- NO AMPLIFICATION    Family History  Problem Relation Age of Onset  . Kidney disease Mother     kidney failure   . Kidney failure Mother   . Heart disease Father     heart attack   . Heart attack Father   . Lymphoma Sister   . Pancreatitis Sister     Social History History  Substance Use Topics  . Smoking status: Never Smoker   . Smokeless tobacco: Never Used  . Alcohol Use: No     OCC.    No Known Allergies  Current Outpatient Prescriptions  Medication Sig Dispense Refill  . anastrozole (ARIMIDEX) 1 MG tablet       . aspirin EC 81 MG tablet Take 81 mg by mouth daily.        Marland Kitchen atenolol-chlorthalidone (TENORETIC) 50-25 MG per tablet Take 1  tablet by mouth daily.  90 tablet  3  . cholecalciferol (VITAMIN D) 1000 UNITS tablet Take 1 tablet (1,000 Units total) by mouth daily.  60 tablet  3  . levothyroxine (SYNTHROID) 75 MCG tablet Take 1 tablet (75 mcg total) by mouth daily.  30 tablet  5  . simvastatin (ZOCOR) 20 MG tablet Take 1 tablet (20 mg total) by mouth daily.  30 tablet  5    Review of Systems Review of Systems  Constitutional: Negative for fever, chills and unexpected weight change.  HENT: Negative for hearing loss, congestion, sore throat, trouble swallowing and voice change.   Eyes: Negative for visual disturbance.  Respiratory: Negative for cough and wheezing.   Cardiovascular: Negative for chest pain, palpitations and leg swelling.  Gastrointestinal: Negative for nausea, vomiting, abdominal pain, diarrhea, constipation, blood in stool, abdominal distention and anal bleeding.  Genitourinary: Negative for hematuria, vaginal bleeding and difficulty urinating.  Musculoskeletal: Negative for arthralgias.  Skin: Negative for rash and wound.  Neurological: Negative for seizures, syncope and headaches.  Hematological: Negative for adenopathy. Does not bruise/bleed easily.  Psychiatric/Behavioral: Negative for confusion.  Blood pressure 132/60, pulse 68, temperature 97.1 F (36.2 C), temperature source Temporal, resp. rate 20, height 5' 4.5" (1.638 m), weight 163 lb (73.936 kg).  Physical Exam Physical Exam  Constitutional: She is oriented to person, place, and time. She appears well-developed and well-nourished.  HENT:  Head: Normocephalic and atraumatic.  Eyes: Conjunctivae normal are normal. Pupils are equal, round, and reactive to light.  Neck: Normal range of motion.  Cardiovascular: Normal rate and regular rhythm.   Pulmonary/Chest: Effort normal and breath sounds normal. Right breast exhibits no mass, no skin change and no tenderness. Left breast exhibits no inverted nipple, no mass, no nipple discharge, no  skin change and no tenderness.    Musculoskeletal: Normal range of motion.  Lymphadenopathy:    She has no cervical adenopathy.    She has no axillary adenopathy.  Neurological: She is alert and oriented to person, place, and time.  Skin: Skin is warm and dry.  Psychiatric: She has a normal mood and affect. Her behavior is normal. Judgment and thought content normal.    Data Reviewed   History: The patient has a history of a malignant lumpectomy of  the right breast with radiation therapy in 2012/2013. She has  found to have suspicious calcifications located superiorly within  the right breast on diagnostic mammography and stereotactic core  biopsy was performed.  Exam: The biopsy site is clean and dry without hematoma formation.  Pathology: The pathology demonstrates atypical ductal hyperplasia  with calcifications and fibrocystic changes.  Assessment and Plan: The pathologic findings were discussed with  the patient and her questions were answered. Pathology is  concordant with imaging findings. Surgical excision of the area is  recommended. The patient is scheduled to see Dr. Luisa Hart on  12/27/2011. Post biopsy wound care instructions were reviewed with  the patient. The patient was encouraged to call the [facility]  for additional questions or concerns.    Assessment    77 yo woman with hx of T1CN0, ER/PR + s/p lumpectomy 12/12, status post patient radiation 2013, with 16 fractions given New right breast microcalcifications and ADH by core biopsy. History of right breast cancer stage 1    Plan    Right breast partial mastectomy with NL.The procedure has been discussed with the patient. Alternatives to surgery have been discussed with the patient.  Risks of surgery include bleeding,  Infection,  Seroma formation, death,  and the need for further surgery.   The patient understands and wishes to proceed.       Emmaly Leech A. 12/27/2011, 10:13 AM

## 2012-01-10 NOTE — Anesthesia Postprocedure Evaluation (Signed)
  Anesthesia Post-op Note  Patient: Molly Lambert  Procedure(s) Performed: Procedure(s) (LRB) with comments: PARTIAL MASTECTOMY WITH NEEDLE LOCALIZATION (Right) - right breast needle localized partial mastectomy  Patient Location: PACU  Anesthesia Type:General  Level of Consciousness: awake, alert  and oriented  Airway and Oxygen Therapy: Patient Spontanous Breathing  Post-op Pain: none  Post-op Assessment: Post-op Vital signs reviewed, Patient's Cardiovascular Status Stable, Respiratory Function Stable, Patent Airway, No signs of Nausea or vomiting and Pain level controlled  Post-op Vital Signs: stable  Complications: No apparent anesthesia complications

## 2012-01-10 NOTE — Transfer of Care (Signed)
Immediate Anesthesia Transfer of Care Note  Patient: Molly Lambert  Procedure(s) Performed: Procedure(s) (LRB) with comments: PARTIAL MASTECTOMY WITH NEEDLE LOCALIZATION (Right) - right breast needle localized partial mastectomy  Patient Location: PACU  Anesthesia Type:General  Level of Consciousness: awake  Airway & Oxygen Therapy: Patient Spontanous Breathing and Patient connected to face mask oxygen  Post-op Assessment: Report given to PACU RN and Post -op Vital signs reviewed and stable  Post vital signs: Reviewed and stable  Complications: No apparent anesthesia complications

## 2012-01-13 ENCOUNTER — Encounter (HOSPITAL_BASED_OUTPATIENT_CLINIC_OR_DEPARTMENT_OTHER): Payer: Self-pay | Admitting: Surgery

## 2012-01-15 ENCOUNTER — Telehealth (INDEPENDENT_AMBULATORY_CARE_PROVIDER_SITE_OTHER): Payer: Self-pay

## 2012-01-15 NOTE — Telephone Encounter (Signed)
Called patient to give her benign path results. No answer, left message to call back.

## 2012-01-15 NOTE — Telephone Encounter (Signed)
Pt advised of note in system that path is benign.

## 2012-01-28 ENCOUNTER — Ambulatory Visit (INDEPENDENT_AMBULATORY_CARE_PROVIDER_SITE_OTHER): Payer: Medicare Other | Admitting: Surgery

## 2012-01-28 ENCOUNTER — Encounter (INDEPENDENT_AMBULATORY_CARE_PROVIDER_SITE_OTHER): Payer: Self-pay | Admitting: Surgery

## 2012-01-28 VITALS — BP 128/82 | HR 52 | Temp 97.4°F | Resp 14 | Ht 64.5 in | Wt 163.8 lb

## 2012-01-28 DIAGNOSIS — Z9889 Other specified postprocedural states: Secondary | ICD-10-CM

## 2012-01-28 NOTE — Progress Notes (Signed)
Patient returns after right breast partial mastectomy. Pathology shows no evidence of recurrent cancer and shows fibrocystic change without atypia. She has no complaints.  Exam: Well-healed right breast scar. No signs of infection or stroma  Impression status post right breast partial mastectomy for fibrocystic change and microcalcifications  Plan: Return one year. Resume regular screening mammogram.

## 2012-01-28 NOTE — Patient Instructions (Signed)
Return 1 year. 

## 2012-02-06 ENCOUNTER — Telehealth: Payer: Self-pay | Admitting: *Deleted

## 2012-02-06 NOTE — Telephone Encounter (Signed)
patient confirmed over the phone the new date and time on 02-12-2012

## 2012-02-11 ENCOUNTER — Ambulatory Visit: Payer: Medicare Other | Admitting: Oncology

## 2012-02-11 ENCOUNTER — Telehealth: Payer: Self-pay | Admitting: *Deleted

## 2012-02-11 ENCOUNTER — Other Ambulatory Visit: Payer: Medicare Other | Admitting: Lab

## 2012-02-11 NOTE — Telephone Encounter (Signed)
Patient called in and requested to reschedule her lab and md appointment gave the patient instructions that I would give her a call back with the new date and time

## 2012-02-12 ENCOUNTER — Other Ambulatory Visit: Payer: Self-pay | Admitting: Lab

## 2012-02-12 ENCOUNTER — Ambulatory Visit: Payer: Self-pay | Admitting: Oncology

## 2012-04-24 ENCOUNTER — Other Ambulatory Visit: Payer: Self-pay | Admitting: *Deleted

## 2012-04-24 MED ORDER — ANASTROZOLE 1 MG PO TABS: 1.0000 mg | ORAL_TABLET | Freq: Every day | ORAL | Status: DC

## 2012-05-29 ENCOUNTER — Encounter: Payer: Self-pay | Admitting: Family Medicine

## 2012-09-11 ENCOUNTER — Other Ambulatory Visit: Payer: Self-pay | Admitting: Family Medicine

## 2012-09-11 NOTE — Telephone Encounter (Signed)
Med encounter

## 2012-11-05 ENCOUNTER — Other Ambulatory Visit: Payer: Self-pay | Admitting: *Deleted

## 2012-11-05 ENCOUNTER — Other Ambulatory Visit: Payer: Self-pay | Admitting: Family Medicine

## 2012-11-05 DIAGNOSIS — C50119 Malignant neoplasm of central portion of unspecified female breast: Secondary | ICD-10-CM

## 2012-11-05 MED ORDER — ANASTROZOLE 1 MG PO TABS: 1.0000 mg | ORAL_TABLET | Freq: Every day | ORAL | Status: DC

## 2012-11-05 NOTE — Telephone Encounter (Signed)
LM for pt to rtc for recheck.

## 2012-11-05 NOTE — Telephone Encounter (Signed)
KEISHA MARTINI,RN WILL CONTACT PT. CONCERNING AN APPOINTMENT.

## 2012-11-06 ENCOUNTER — Telehealth: Payer: Self-pay | Admitting: *Deleted

## 2012-11-06 NOTE — Telephone Encounter (Signed)
Left message for a return phone call to schedule patient with new provider.  Awaiting patient response.

## 2012-11-10 ENCOUNTER — Ambulatory Visit (INDEPENDENT_AMBULATORY_CARE_PROVIDER_SITE_OTHER): Payer: Medicare Other | Admitting: Family Medicine

## 2012-11-10 VITALS — BP 138/74 | HR 64 | Temp 97.9°F | Resp 18 | Ht 64.25 in | Wt 158.2 lb

## 2012-11-10 DIAGNOSIS — I10 Essential (primary) hypertension: Secondary | ICD-10-CM

## 2012-11-10 DIAGNOSIS — H9110 Presbycusis, unspecified ear: Secondary | ICD-10-CM

## 2012-11-10 DIAGNOSIS — H6983 Other specified disorders of Eustachian tube, bilateral: Secondary | ICD-10-CM

## 2012-11-10 DIAGNOSIS — E039 Hypothyroidism, unspecified: Secondary | ICD-10-CM

## 2012-11-10 DIAGNOSIS — H9113 Presbycusis, bilateral: Secondary | ICD-10-CM

## 2012-11-10 DIAGNOSIS — H6980 Other specified disorders of Eustachian tube, unspecified ear: Secondary | ICD-10-CM

## 2012-11-10 DIAGNOSIS — E7800 Pure hypercholesterolemia, unspecified: Secondary | ICD-10-CM

## 2012-11-10 LAB — COMPREHENSIVE METABOLIC PANEL
AST: 18 U/L (ref 0–37)
Alkaline Phosphatase: 86 U/L (ref 39–117)
Glucose, Bld: 100 mg/dL — ABNORMAL HIGH (ref 70–99)
Sodium: 137 mEq/L (ref 135–145)
Total Bilirubin: 0.6 mg/dL (ref 0.3–1.2)
Total Protein: 6.9 g/dL (ref 6.0–8.3)

## 2012-11-10 LAB — LIPID PANEL
Cholesterol: 276 mg/dL — ABNORMAL HIGH (ref 0–200)
HDL: 60 mg/dL
LDL Cholesterol: 178 mg/dL — ABNORMAL HIGH (ref 0–99)
Total CHOL/HDL Ratio: 4.6 ratio
Triglycerides: 189 mg/dL — ABNORMAL HIGH
VLDL: 38 mg/dL (ref 0–40)

## 2012-11-10 MED ORDER — LEVOTHYROXINE SODIUM 75 MCG PO TABS: 75.0000 ug | ORAL_TABLET | Freq: Every day | ORAL | Status: DC

## 2012-11-10 MED ORDER — FLUTICASONE PROPIONATE 50 MCG/ACT NA SUSP: 2.0000 | Freq: Every day | NASAL | Status: DC

## 2012-11-10 MED ORDER — ATENOLOL-CHLORTHALIDONE 50-25 MG PO TABS: ORAL_TABLET | ORAL | Status: DC

## 2012-11-10 MED ORDER — SIMVASTATIN 20 MG PO TABS: ORAL_TABLET | ORAL | Status: DC

## 2012-11-10 NOTE — Progress Notes (Addendum)
Subjective: Patient is here for a refill of her medications. She's been having problems with hearing loss sensation. She got some wax out of her left ear. It did not help her hearing. In the past she had some similar difficulty was given Flonase and did well. She takes other medications faithfully. She needs to go see the oncologist. She is out of one or 2 of her medicines the last couple of days.  Objective: Pleasant elderly lady in no major distress. Left TM is normal. Right is partially occluded by cerumen. Throat clear. Neck supple. Chest clear. Heart regular without murmurs.  Assessment: Hypertension  Hyperlipidemia Cerumen in ear Possible eustachian tube dysfunction History breast cancer Hypothyroidism  Plan: Check labs Refill medications See an ear doctor she keeps having problems Return to see Dr. Audria Nine in about 4 months.

## 2012-11-10 NOTE — Patient Instructions (Signed)
Continue taking your thyroid medication  Use over-the-counter Polysporin for scrapes and things  Continue your blood pressure and cholesterol medications  Use some peroxide or suite oral or Debrox drops in the right ear to try and help get the wax out  Since the Flonase helped you in the past, we will try that again to see if it opens a few years. If he is continued to give you trouble see an ear Dr.

## 2012-11-11 LAB — TSH: TSH: 3.629 u[IU]/mL (ref 0.350–4.500)

## 2012-11-12 ENCOUNTER — Telehealth: Payer: Self-pay | Admitting: Oncology

## 2012-11-12 ENCOUNTER — Telehealth: Payer: Self-pay | Admitting: *Deleted

## 2012-11-12 NOTE — Telephone Encounter (Signed)
Pt request to see Dr. Darnelle Catalan as new med onc since PR left [facility].  Scheduled pt to see Norina Buzzard, NP on 11/17/12 at 0900.  Confirmed appt date and time with pt.  Pt denies further needs or concerns.  Contact information given.

## 2012-11-12 NOTE — Telephone Encounter (Signed)
Pt called today returning a call to [facility] in [facility] who called her to get her established with a new provider. Pt forwarded to [facility] in [place] absence to lm. Pt informed she has not heard back from [place] since calling back because she is out of the office. Pt asked to leave a message for [facility] and I also [employer] re follow up w/pt.

## 2012-11-17 ENCOUNTER — Telehealth: Payer: Self-pay | Admitting: Oncology

## 2012-11-17 ENCOUNTER — Encounter: Payer: Self-pay | Admitting: Family

## 2012-11-17 ENCOUNTER — Ambulatory Visit (HOSPITAL_BASED_OUTPATIENT_CLINIC_OR_DEPARTMENT_OTHER): Payer: Medicare Other | Admitting: Family

## 2012-11-17 VITALS — BP 175/77 | HR 61 | Temp 98.0°F | Resp 18 | Ht 64.25 in | Wt 159.2 lb

## 2012-11-17 DIAGNOSIS — Z170 Estrogen receptor positive status [ER+]: Secondary | ICD-10-CM

## 2012-11-17 DIAGNOSIS — C50919 Malignant neoplasm of unspecified site of unspecified female breast: Secondary | ICD-10-CM

## 2012-11-17 DIAGNOSIS — Z853 Personal history of malignant neoplasm of breast: Secondary | ICD-10-CM

## 2012-11-17 MED ORDER — ANASTROZOLE 1 MG PO TABS: 1.0000 mg | ORAL_TABLET | Freq: Every day | ORAL | Status: DC

## 2012-11-17 NOTE — Progress Notes (Addendum)
[facility]  Telephone:([phone]) [phone] [phone]) [phone]  OFFICE PROGRESS NOTE   ID: Molly Lambert   DOB: [birth year]  MR#: [id number]  QIO#:[id number]   PCP: [name], MD SU: Harriette Bouillon, M.D. RAD ONC: Lonie Peak, M.D.   HISTORY OF PRESENT ILLNESS: From Dr. Theron Arista Rubin's new patient evaluation note dated 02/28/2011: "Palpable breast mass noted x 6 months. She is a pleasant 78 yo woman with no significant past medical history, noted crusting of lt nipple ~ 6 months with some thickening of that nipple/areola. She underwent mammography on 11/16/10, which showed erythema and thickening of the nipple. U/s showed mixed echogenicity of the nipple and a fairly superficial lesion. A punch biopsy of the area on 11/28/10 showed invasive cancer , Grade 2, er 100%, pr 69%, ki 67 6%, her 2 1.33.  A MRI of both breasts on 12/13/10, showed  enhancement of the area measuring 1.5 x 2.3 x 1.4 cm.   She underwent lumpectomy on 01/22/11, which showed a 1.5 cm G2 lesion with dermal involvement, 1 negative sentinel node and clear margins."  Her subsequent history is as detailed below.   INTERVAL HISTORY: Dr. Darnelle Catalan and I saw Molly Luz Deavers today for followup of invasive ductal carcinoma of the right breast.  The patient was last seen by Dr. Donnie Coffin on 08/12/2011.  Since her last office visit, the patient has been doing relatively well.  She is establishing herself with Dr. Darrall Dears service today.   REVIEW OF SYSTEMS: A 10 point review of systems was completed and is negative except occasional constipation.  We discussed remedies for constipation including increased hydration, daily stool softeners, increase fiber intake, an increased consumption of fruits and vegetables. I noticed that Mrs. Duarte's blood pressure is elevated today at 175/77.  I asked her when she last taken her blood pressure medication and she stated she ran out of her medications and hadn't taken them in at least a couple  of days.  She spoke of financial concerns regarding her medications.  I will direct her to speak for a financial counselors who may be able to assist her with obtaining her medications.  Mrs. Aybar denies any other symptomatology including fatigue, fever or chills, headache, vision changes, swollen glands, cough or shortness of breath, chest pain or discomfort, nausea, vomiting, diarrhea, change in urinary or bowel habits, arthralgias/myalgias, unusual bleeding/bruising or any other symptomatology.    PAST MEDICAL HISTORY: Past Medical History  Diagnosis Date  . Hypertension     NO CARDIAC MD,  [facility]  PCP  ,  URGENT MED  . Cataract   . Leg laceration     WITH SURGERY  . Breast cancer 11/2010    Rt breast  . S/P radiation therapy 03/14/11 - 04/05/11    Right Breast / 4256 cGy in 16 Fractions  . Thyroid disease   . Hypercholesteremia     PAST SURGICAL HISTORY: Past Surgical History  Procedure Laterality Date  . Tubal ligation  1972  . Eye surgery       BILATERAL CATARACT SURGERY  . Breast surgery  11/28/10    SKIN-PUNCH BIOSPY RIGHT BREAST(NIPPLE), ER+, RP+, LOW S-PHASE, HER 2NEU NEGAITIVE  . Breast lumpectomy  01/22/11    RIGHT BREAST LUMPECTOMY WITH BIOSPY OF 1 SENTINEL NODE, INVASIVE GRADE II DUCTAL CARCINOMA , INTERMEDIATE GRADE DUCTAL CARCINOMA IN SITU , DERMAL SKIN INVOLVED, MARGINS NEGATIVE,( 0/1)  NODE POSITIVE, ER+, PR+, LOW s-PHASE, HER 2 NEU- NO AMPLIFICATION  . Partial mastectomy with needle  localization  01/10/2012    Procedure: PARTIAL MASTECTOMY WITH NEEDLE LOCALIZATION;  Surgeon: Maisie Fus A. Cornett, MD;  Location: [facility];  Service: General;  Laterality: Right;  right breast needle localized partial mastectomy    FAMILY HISTORY Family History  Problem Relation Age of Onset  . Kidney disease Mother     kidney failure   . Kidney failure Mother   . Heart disease Father     heart attack   . Heart attack Father   . Lymphoma Sister   . Pancreatitis  Sister     GYNECOLOGIC HISTORY: Gravida 3, para 2, one stillbirth, age of menarche 54, H.  He 72, age of menopause 38, no history of birth control use or hormonal replacement therapy.  SOCIAL HISTORY: Mr. and Mrs. Done has been married since 1957.  Her husband Pleasant Social worker is a retired Curator.  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 She and her husband have 2 adult children, a son that is 85 years old and her daughter that is 77 years old.  Her son resides in [place], [place] and her daughter resides in [place], [place].  They have 6 grandchildren and 4 great-grandchildren.  In her spare time Mrs. Pettway enjoys spending time with her family, cooking, traveling, and gardening.   ADVANCED DIRECTIVES: Not on file  HEALTH MAINTENANCE: History  Substance Use Topics  . Smoking status: Never Smoker   . Smokeless tobacco: Never Used  . Alcohol Use: Yes     Comment: Rarely/On Special Occasions    Colonoscopy: Not on file PAP: Not on file Bone density: The patient's last bone density scan on 03/26/2011 showed a T score of -0.3 (normal). Lipid panel: 11/10/2012  No Known Allergies  Current Outpatient Prescriptions  Medication Sig Dispense Refill  . anastrozole (ARIMIDEX) 1 MG tablet Take 1 tablet (1 mg total) by mouth daily.  90 tablet  5  . aspirin EC 81 MG tablet Take 81 mg by mouth daily.        Marland Kitchen atenolol-chlorthalidone (TENORETIC) 50-25 MG per tablet 1 Tablet daily for blood pressure  30 tablet  5  . Cholecalciferol (VITAMIN D) 2000 UNITS CAPS Take 1 capsule by mouth daily.      . fluticasone (FLONASE) 50 MCG/ACT nasal spray Place 2 sprays into the nose daily.  16 g  3  . levothyroxine (SYNTHROID, LEVOTHROID) 75 MCG tablet Take 1 tablet (75 mcg total) by mouth daily before breakfast.  30 tablet  5  . simvastatin (ZOCOR) 20 MG tablet One daily for  cholesterol  30 tablet  5   No current facility-administered medications for this visit.    OBJECTIVE: Filed Vitals:   11/17/12 0911  BP: 175/77  Pulse: 61  Temp: 98 F (36.7 C)  Resp: 18     Body mass index is 27.11 kg/(m^2).      ECOG FS: 1 - Symptomatic but completely ambulatory  General appearance: Alert, cooperative, well nourished, no apparent distress Head: Normocephalic, without obvious abnormality, atraumatic Eyes: Arcus senilis, PERRLA, EOMI Nose: Nares, septum and mucosa are normal, no drainage or sinus tenderness Neck: No adenopathy, supple, symmetrical, trachea midline, no tenderness Resp: Clear to auscultation bilaterally, no wheezes/rales/rhonchi Cardio: Regular rate and rhythm, S1, S2 normal, no murmur, click, rub or gallop, no edema Breasts:  Right breast has well-healed surgical scars, right breast nipple  is surgically absent, left breast is visibly larger than the right breast, glandular breast tissue bilaterally, pendulous left breast, bilateral axillary fullness GI: Soft, distended, non-tender, hypoactive bowel sounds, no organomegaly Skin: No rashes/lesions, skin warm and dry, no erythematous areas, no cyanosis, numerous nevi on face/cervical/trunk areas  M/S:  Atraumatic, normal strength in all extremities, normal range of motion, no clubbing  Lymph nodes: Cervical, supraclavicular, and axillary nodes normal Neurologic: Grossly normal, cranial nerves II through XII intact, alert and oriented x 3 Psych: Appropriate affect   LAB RESULTS: Lab Results  Component Value Date   WBC 5.3 01/08/2012   NEUTROABS 2.8 08/12/2011   HGB 12.9 01/08/2012   HCT 37.4 01/08/2012   MCV 91.9 01/08/2012   PLT 248 01/08/2012      Chemistry      Component Value Date/Time   NA 137 11/10/2012 1126   K 4.1 11/10/2012 1126   CL 100 11/10/2012 1126   CO2 31 11/10/2012 1126   BUN 17 11/10/2012 1126   CREATININE 0.76 11/10/2012 1126   CREATININE 0.78 01/08/2012 1030        Component Value Date/Time   CALCIUM 9.9 11/10/2012 1126   ALKPHOS 86 11/10/2012 1126   AST 18 11/10/2012 1126   ALT 15 11/10/2012 1126   BILITOT 0.6 11/10/2012 1126       Lab Results  Component Value Date   LABCA2 41* 02/28/2011    Urinalysis No results found for this basename: colorurine,  appearanceur,  labspec,  phurine,  glucoseu,  hgbur,  bilirubinur,  ketonesur,  proteinur,  urobilinogen,  nitrite,  leukocytesur    STUDIES: 1.  The patient's last bone density scan on 03/26/2011 showed a T score of -0.3 (normal).  2.  The patient's last bilateral digital diagnostic mammogram months 12/13/2011 showed right central partial mastectomy has been performed.  There are scattered fibroglandular densities.  There is no abnormality is noted on the left. There are beaded calcifications in a ductal distribution at 12 o'clock on the right.  These cover an area of approximately 15 mm. The appearance is suspicious and biopsy is recommended.  I discussed stereotactic core needle biopsy with the patient and she agreed with this plan.    ASSESSMENT: 77 y.o. [place], [place]: 1.  Status post skin punch breast biopsy on 11/28/2010 which showed invasive ductal carcinoma grade 1-2.  2.  The patient had bilateral breast MRI on 12/13/2010 which showed there is asymmetric persistent enhancement of the right nipple and subareolar breast parenchyma.  This measures 1.5 x 2.3 x 1.4 cm.  No other areas of suspicious enhancement are identified in either breast.  No enlarged internal mammary or axillary lymph nodes are identified (clinical stage IIA, T2 N0).  3.  Status post right breast lumpectomy with right sentinel node biopsy on 01/22/2011 for a stage I, pT1c, pN0, pMX, 1.5 cm invasive ductal carcinoma grade 2, with intermediate grade ductal carcinoma in situ, and negative margins, estrogen receptor 100% positive, progesterone receptor 69% positive, Ki-67 60%, HER-2/neu by CISH no amplification  with a ratio 1.21, with 0/1 metastatic right axillary lymph nodes.  4.  The patient underwent radiation therapy from 03/14/2011 through 04/05/2011.  5.  The patient started antiestrogen therapy with Arimidex in 03/2011.  6.  Status post right breast needle core biopsy at the 12 o'clock position on 12/23/2011 which showed atypical ductal hyperplasia with calcifications, fibrocystic changes with calcifications, and stromal calcifications.  7.  Status post right breast lumpectomy on 01/10/2012  which showed fibrocystic changes, benign calcifications, healing biopsy site, no evidence of malignancy.  PLAN: The patient will continue antiestrogen therapy with Arimidex with planned therapy to continue for a total of 5 years (until 03/2016).  An electronic prescription for Arimidex 1 mg by mouth daily #90 with 5 refills was sent to the patient's pharmacy.  We will schedule the patient's bilateral digital diagnostic mammogram is due in 11/2012 and bone density scan due in 02/2013 for her.  We would like to alternate office visits with her primary care physician and Dr. Luisa Hart so that Mrs. Formoso is being evaluated every 4 months.  Mrs. Colebank states her annual visits with Dr. Audria Nine are in July, her annual visits with Dr. Luisa Hart are in November, and we will plan to see her again in 04/2013.  We will check laboratories of CBC, CMP, vitamin D level and LDH at that time.  We will schedule her to see Dr. Luisa Hart in 12/2012.  All questions were answered.  The patient was encouraged to contact us in the interim with any problems, questions or concerns.   Larina Bras, NP-C 11/17/2012, 7:34 PM  ADDENDUM: This 78 year old [place] woman established herself in my practice today. We discussed her diagnosis, treatment history and prognosis. In brief: (She underwent right lumpectomy and sentinel lymph node sampling November of 2012 for a pT1c pN0, stage IA invasive ductal carcinoma, grade 2, estrogen receptor  100% positive, progesterone receptor 69% positive, with an MIB-1 of 6% and no HER-2 amplification.  She completed adjuvant radiation February of 2013 and started anastrozole at that time. She had a normal bone density in January of 2013.  Kellyn understands she has a very good prognosis, and that the anastrozole not only cut her risk of this cancer coming back in half, but also cuts and have her risk of developing a new breast cancer. She appears to be tolerating treatment well. The overall plan is to continue on anastrozole for total of 5 years, then release her from followup. She knows that the goal of treatment is cure, has a good understanding of the plan, and is in agreement with it.  I personally saw this patient and performed a substantive portion of this encounter with the listed APP documented above.   Lowella Dell, MD

## 2012-11-17 NOTE — Patient Instructions (Signed)
Please contact us at [phone] if you have any questions or concerns.  Please continue to do well and enjoy life!!!  Get plenty of rest, drink plenty of water, exercise daily (walking), eat a balanced diet.  Take vitamin D3 1000 IUs daily.   Complete monthly self-breast examinations.  Have your mammogram completed every year.

## 2012-12-30 ENCOUNTER — Other Ambulatory Visit: Payer: Medicare Other

## 2012-12-30 ENCOUNTER — Ambulatory Visit
Admission: RE | Admit: 2012-12-30 | Discharge: 2012-12-30 | Disposition: A | Discharge status: Home / Self Care | Payer: Medicare Other | Source: Ambulatory Visit | Attending: Family | Admitting: Family

## 2012-12-30 DIAGNOSIS — Z853 Personal history of malignant neoplasm of breast: Secondary | ICD-10-CM

## 2013-01-29 ENCOUNTER — Ambulatory Visit (INDEPENDENT_AMBULATORY_CARE_PROVIDER_SITE_OTHER): Payer: Medicare Other | Admitting: Surgery

## 2013-04-26 ENCOUNTER — Other Ambulatory Visit: Payer: Medicare Other

## 2013-04-26 ENCOUNTER — Telehealth: Payer: Self-pay | Admitting: *Deleted

## 2013-04-26 ENCOUNTER — Other Ambulatory Visit: Payer: Self-pay | Admitting: *Deleted

## 2013-04-26 ENCOUNTER — Ambulatory Visit: Payer: Medicare Other | Admitting: Oncology

## 2013-04-26 DIAGNOSIS — C50919 Malignant neoplasm of unspecified site of unspecified female breast: Secondary | ICD-10-CM

## 2013-04-26 DIAGNOSIS — C50119 Malignant neoplasm of central portion of unspecified female breast: Secondary | ICD-10-CM

## 2013-04-26 NOTE — Telephone Encounter (Signed)
Lm stating the i was returning her call and ask for her to give me a call back . Pt stated she overslept i made the pt aware that [facility] 1st opening will be in May...td

## 2013-04-27 ENCOUNTER — Telehealth: Payer: Self-pay | Admitting: *Deleted

## 2013-04-27 NOTE — Telephone Encounter (Signed)
Pt called to rs her missed appt. gv appt for 06/28/13 w/ labs@ 10:30am and ov@ 11am....td

## 2013-05-18 ENCOUNTER — Telehealth: Payer: Self-pay | Admitting: *Deleted

## 2013-05-18 NOTE — Telephone Encounter (Signed)
Mailed patient new calendar

## 2013-05-18 NOTE — Telephone Encounter (Signed)
Per 3/23 POF I have moved appts from Dr. Jana Hakim to covering provider. Patient aware and ok with move.

## 2013-06-28 ENCOUNTER — Encounter: Payer: Medicare Other | Admitting: Oncology

## 2013-06-28 ENCOUNTER — Telehealth: Payer: Self-pay | Admitting: Oncology

## 2013-06-28 ENCOUNTER — Other Ambulatory Visit: Payer: Medicare Other

## 2013-06-28 ENCOUNTER — Ambulatory Visit: Payer: Medicare Other

## 2013-06-28 NOTE — Telephone Encounter (Signed)
, °

## 2013-11-15 ENCOUNTER — Telehealth: Payer: Self-pay | Admitting: *Deleted

## 2013-11-15 NOTE — Telephone Encounter (Signed)
Phoned patient to schedule [facility] at either main # or my # directly.

## 2013-11-20 ENCOUNTER — Other Ambulatory Visit: Payer: Self-pay | Admitting: Family Medicine

## 2013-11-25 ENCOUNTER — Other Ambulatory Visit: Payer: Self-pay | Admitting: Family Medicine

## 2013-11-25 ENCOUNTER — Telehealth: Payer: Self-pay | Admitting: *Deleted

## 2013-11-25 DIAGNOSIS — Z853 Personal history of malignant neoplasm of breast: Secondary | ICD-10-CM

## 2013-11-25 NOTE — Telephone Encounter (Signed)
Phoned & [facility] on patient's voicemail with simply name, title, physician's office & direct phone #, to be returned at her earliest convenience.  Composing & mailing letter for [facility].

## 2013-12-31 ENCOUNTER — Ambulatory Visit
Admission: RE | Admit: 2013-12-31 | Discharge: 2013-12-31 | Disposition: A | Discharge status: Home / Self Care | Payer: Medicare Other | Source: Ambulatory Visit | Attending: Family Medicine | Admitting: Family Medicine

## 2013-12-31 DIAGNOSIS — Z853 Personal history of malignant neoplasm of breast: Secondary | ICD-10-CM

## 2014-01-28 ENCOUNTER — Ambulatory Visit (INDEPENDENT_AMBULATORY_CARE_PROVIDER_SITE_OTHER): Payer: Medicare Other | Admitting: Family Medicine

## 2014-01-28 ENCOUNTER — Encounter: Payer: Self-pay | Admitting: Family Medicine

## 2014-01-28 VITALS — BP 158/73 | HR 61 | Temp 98.2°F | Resp 16 | Ht 64.0 in | Wt 163.0 lb

## 2014-01-28 DIAGNOSIS — Z23 Encounter for immunization: Secondary | ICD-10-CM

## 2014-01-28 DIAGNOSIS — I10 Essential (primary) hypertension: Secondary | ICD-10-CM

## 2014-01-28 DIAGNOSIS — E7800 Pure hypercholesterolemia, unspecified: Secondary | ICD-10-CM

## 2014-01-28 DIAGNOSIS — Z853 Personal history of malignant neoplasm of breast: Secondary | ICD-10-CM

## 2014-01-28 DIAGNOSIS — Z0000 Encounter for general adult medical examination without abnormal findings: Secondary | ICD-10-CM

## 2014-01-28 DIAGNOSIS — E039 Hypothyroidism, unspecified: Secondary | ICD-10-CM

## 2014-01-28 DIAGNOSIS — M8580 Other specified disorders of bone density and structure, unspecified site: Secondary | ICD-10-CM

## 2014-01-28 MED ORDER — ATENOLOL-CHLORTHALIDONE 50-25 MG PO TABS: 1.0000 | ORAL_TABLET | Freq: Every day | ORAL | Status: DC

## 2014-01-28 MED ORDER — ZOSTER VACCINE LIVE 19400 UNT/0.65ML ~~LOC~~ SOLR: 0.6500 mL | Freq: Once | SUBCUTANEOUS | Status: DC

## 2014-01-28 MED ORDER — SIMVASTATIN 20 MG PO TABS: ORAL_TABLET | ORAL | Status: DC

## 2014-01-28 MED ORDER — LEVOTHYROXINE SODIUM 75 MCG PO TABS: 75.0000 ug | ORAL_TABLET | Freq: Every day | ORAL | Status: DC

## 2014-01-28 NOTE — Progress Notes (Signed)
Subjective:    Patient ID: Molly Lambert, female    DOB: 1934/10/27, 79 y.o.   MRN: [id number]  HPI  This 79 y.o. Cauc female is here for annual wellness examination and labs.   HCM: MMG- Current.            DEXA- 2013.           IMM- Current; pt declines Flu vaccine.           CRS- Current (2008 w/10-yr recall).           Vision- Recent eye surgery.  Patient Active Problem List   Diagnosis Date Noted  . Breast microcalcifications 12/27/2011  . History of breast cancer 12/27/2011  . Hypothyroidism 08/25/2011  . Hypercholesteremia 08/25/2011  . HTN (hypertension) 08/25/2011  . Cancer of central portion of female breast 02/27/2011  . Breast cancer, stage 1 02/06/2011  . Breast mass, right 11/28/2010    Prior to Admission medications   Medication Sig Start Date End Date Taking? Authorizing Provider  anastrozole (ARIMIDEX) 1 MG tablet Take 1 tablet (1 mg total) by mouth daily. 11/17/12  Yes Vivien Rota, NP  aspirin EC 81 MG tablet Take 81 mg by mouth daily.     Yes Historical Provider, MD  atenolol-chlorthalidone (TENORETIC) 50-25 MG per tablet Take 1 tablet by mouth daily. 01/28/14  Yes Barton Fanny, MD  Cholecalciferol (VITAMIN D) 2000 UNITS CAPS Take 1 capsule by mouth daily.   Yes Historical Provider, MD  fluticasone (FLONASE) 50 MCG/ACT nasal spray Place 2 sprays into the nose daily. 11/10/12  Yes Posey Boyer, MD  levothyroxine (SYNTHROID, LEVOTHROID) 75 MCG tablet Take 1 tablet (75 mcg total) by mouth daily before breakfast. 01/28/14  Yes Barton Fanny, MD  simvastatin (ZOCOR) 20 MG tablet One daily for cholesterol   Yes Barton Fanny, MD   History   Social History  . Marital Status: Married    Spouse Name: N/A    Number of Children: N/A  . Years of Education: N/A   Occupational History  . Not on file.   Social History Main Topics  . Smoking status: Never Smoker   . Smokeless tobacco: Never Used  . Alcohol Use: Yes     Comment: Rarely/On  Special Occasions  . Drug Use: No  . Sexual Activity: No     Comment: G3, P3, MENARCHE, AGE 28, MENOPAUSE AGE 30, NO BC AND HRT X 10 YEARS   Other Topics Concern  . Not on file   Social History Narrative   Married for 37 years      3 children, one deceased   67 grand-children, 3 great-grandchildren    Family History  Problem Relation Age of Onset  . Kidney disease Mother     kidney failure   . Kidney failure Mother   . Stroke Mother   . Heart disease Father     heart attack   . Heart attack Father   . Lymphoma Sister   . Pancreatitis Sister     Review of Systems  Constitutional: Negative.   HENT: Negative.   Eyes:       S/P cataract removal- no recent eye evaluation.  Respiratory: Negative for chest tightness and shortness of breath.   Genitourinary: Negative.   Musculoskeletal: Positive for arthralgias.       Hands and knees.  Skin: Negative.   Neurological: Negative.   Psychiatric/Behavioral: Negative.        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 Stays active.  All other systems reviewed and are negative.     Objective:   Physical Exam  Constitutional: She is oriented to person, place, and time. Vital signs are normal. She appears well-developed and well-nourished. No distress.  HENT:  Head: Normocephalic and atraumatic.  Right Ear: Hearing, tympanic membrane, external ear and ear canal normal.  Left Ear: Hearing, tympanic membrane, external ear and ear canal normal.  Nose: Nose normal. No nasal deformity or septal deviation.  Mouth/Throat: Uvula is midline and oropharynx is clear and moist. No oral lesions. Normal dentition.  Eyes: Conjunctivae, EOM and lids are normal. Pupils are equal, round, and reactive to light. No scleral icterus.  Neck: Trachea normal, normal range of motion, full passive range of motion without pain and phonation normal. Neck supple. No JVD  present. No spinous process tenderness and no muscular tenderness present. Carotid bruit is not present. No thyroid mass and no thyromegaly present.  Cardiovascular: Normal rate, regular rhythm, S1 normal, S2 normal, normal heart sounds, intact distal pulses and normal pulses.   No extrasystoles are present. PMI is not displaced.  Exam reveals no gallop and no friction rub.   No murmur heard. Pulmonary/Chest: Effort normal and breath sounds normal. No respiratory distress. She has no decreased breath sounds. She has no wheezes. She has no rhonchi. Right breast exhibits no inverted nipple, no mass, no nipple discharge, no skin change and no tenderness. Left breast exhibits no inverted nipple, no mass, no nipple discharge, no skin change and no tenderness. Breasts are symmetrical.  Abdominal: Soft. Normal appearance and bowel sounds are normal. She exhibits no distension, no abdominal bruit, no pulsatile midline mass and no mass. There is no hepatosplenomegaly. There is no tenderness. There is no guarding and no CVA tenderness.  Genitourinary:  Deferred.  Musculoskeletal:       Cervical back: Normal.       Thoracic back: Normal.       Lumbar back: Normal.  Remainder of exam remarkable for mild DJD changes in hands and knees/lower ext joints.  Lymphadenopathy:       Head (right side): No submental, no submandibular, no tonsillar, no preauricular, no posterior auricular and no occipital adenopathy present.       Head (left side): No submental, no submandibular, no tonsillar, no preauricular, no posterior auricular and no occipital adenopathy present.    She has no cervical adenopathy.    She has no axillary adenopathy.       Right: No inguinal and no supraclavicular adenopathy present.       Left: No inguinal and no supraclavicular adenopathy present.  Neurological: She is alert and oriented to person, place, and time. She has normal strength and normal reflexes. She displays no atrophy. No cranial  nerve deficit or sensory deficit. She exhibits normal muscle tone. She displays a negative Romberg sign. Coordination and gait normal.  Get Up and Go test: time= 10 sec.  Skin: Skin is warm, dry and intact. No ecchymosis and no rash noted. She is not diaphoretic. No cyanosis or erythema. No pallor. Nails show no clubbing.  Psychiatric: She has a normal mood and affect. Her speech is normal and behavior is normal. Judgment and thought content normal. Cognition and memory are normal.  Nursing note and vitals reviewed.      Assessment & Plan:  Encounter for Medicare annual wellness exam - Plan: IFOBT  POC (occult bld, rslt in office)  Hypothyroidism, unspecified hypothyroidism type - Stable on current medication dose of levothyroxine 75 mcg daily. Plan: levothyroxine (SYNTHROID, LEVOTHROID) 75 MCG tablet, Thyroid Panel With TSH  Hypercholesteremia - Plan: simvastatin (ZOCOR) 20 MG tablet, Lipid panel  Low bone mass - Plan: DG Bone Density  History of breast cancer- Pt is overdue for follow-up with Oncologist; I sent a message to her specialist's clinic/NP and asked that they contact her to sch follow-up appt.  Essential hypertension - Stable and well controlled on current medication dose. Plan: CBC with Differential, COMPLETE METABOLIC PANEL WITH GFR  Need for shingles vaccine - Plan: zoster vaccine live, PF, (ZOSTAVAX) 68088 UNT/0.65ML injection  Meds ordered this encounter  Medications  . levothyroxine (SYNTHROID, LEVOTHROID) 75 MCG tablet    Sig: Take 1 tablet (75 mcg total) by mouth daily before breakfast.    Dispense:  30 tablet    Refill:  3  . atenolol-chlorthalidone (TENORETIC) 50-25 MG per tablet    Sig: Take 1 tablet by mouth daily.    Dispense:  30 tablet    Refill:  11  . simvastatin (ZOCOR) 20 MG tablet    Sig: One daily for cholesterol    Dispense:  30 tablet    Refill:  5  . zoster vaccine live, PF, (ZOSTAVAX) 11031 UNT/0.65ML injection    Sig: Inject 19,400 Units  into the skin once.    Dispense:  1 each    Refill:  0

## 2014-01-28 NOTE — Patient Instructions (Addendum)
Keeping You Healthy  Get These Tests  Blood Pressure- Have your blood pressure checked by your healthcare provider at least once a year.  Normal blood pressure is 120/80.  Weight- Have your body mass index (BMI) calculated to screen for obesity.  BMI is a measure of body fat based on height and weight.  You can calculate your own BMI at [employer].it  Cholesterol- Have your cholesterol checked every year.  Diabetes- Have your blood sugar checked every year if you have high blood pressure, high cholesterol, a family history of diabetes or if you are overweight.  Pap Smear- Have a pap smear every 1 to 3 years if you have been sexually active.  If you are older than 65 and recent pap smears have been normal you may not need additional pap smears.  In addition, if you have had a hysterectomy  For benign disease additional pap smears are not necessary.  Mammogram-Yearly mammograms are essential for early detection of breast cancer.  I will send a note to [facility] to ask them to contact you to schedule an appoitnment.  Screening for Colon Cancer- Colonoscopy starting at age 31. Screening may begin sooner depending on your family history and other health conditions.  Follow up colonoscopy as directed by your Gastroenterologist.   You have no desire to have another colonoscopy so you have been given a stool specimen collection tube so that we can do a screening test here in the office.  Screening for Osteoporosis- Screening begins at age 30 with bone density scanning, sooner if you are at higher risk for developing Osteoporosis. I have ordered a bone density study (DEXA) to be performed at [facility].  Get these medicines  Calcium with Vitamin D- Your body requires 1200-1500 mg of Calcium a day and [phone] IU of Vitamin D a day.  You can only absorb 500 mg of Calcium at a time therefore Calcium must be taken in 2 or 3 separate doses throughout the day.  Hormones-  Hormone therapy has been associated with increased risk for certain cancers and heart disease.  Talk to your healthcare provider about if you need relief from menopausal symptoms.  Aspirin- Ask your healthcare provider about taking Aspirin to prevent Heart Disease and Stroke.  Get these Immuniztions  Flu shot- Every fall. You have declined this vaccine.  Pneumonia shot- Once after the age of 105; if you are younger ask your healthcare provider if you need a pneumonia shot.  You are current with this vaccine.  Tetanus- Every ten years. You are current with this vaccine.  Zostavax- Once after the age of 45 to prevent shingles. You have received a prescription for this vaccine to get at a local pharmacy.  Take these steps  Don't smoke- Your healthcare provider can help you quit. For tips on how to quit, ask your healthcare provider or go to www.smokefree.gov or call 1-800 QUIT-NOW.  Be physically active- Exercise 5 days a week for a minimum of 30 minutes.  If you are not already physically active, start slow and gradually work up to 30 minutes of moderate physical activity.  Try walking, dancing, bike riding, swimming, etc.  Eat a healthy diet- Eat a variety of healthy foods such as fruits, vegetables, whole grains, low fat milk, low fat cheeses, yogurt, lean meats, chicken, fish, eggs, dried beans, tofu, etc.  For more information go to www.thenutritionsource.org  Dental visit- Brush and floss teeth twice daily; visit your dentist twice a year.  Eye exam- Visit your Optometrist or Ophthalmologist yearly.  Drink alcohol in moderation- Limit alcohol intake to one drink or less a day.  Never drink and drive.  Depression- Your emotional health is as important as your physical health.  If you're feeling down or losing interest in things you normally enjoy, please talk to your healthcare provider.  Seat Belts- can save your life; always wear one  Smoke/Carbon Monoxide detectors- These  detectors need to be installed on the appropriate level of your home.  Replace batteries at least once a year.  Violence- If anyone is threatening or hurting you, please tell your healthcare provider.  Living Will/ Health care power of attorney- Discuss with your healthcare provider and family.   ARTHRITIS PAIN- You can try Extra Strength Tylenol  2 tablets twice a day. Topical creams often work well for joint pain; apply it to painful areas twice a day as needed.  TAKE YOUR THYROID MEDICATION FIRST THING EVERY MORNING WITH A GLASS OF WATER; DO NOT TAKE THIS MEDICATION WITH OTHER MEDICATIONS, FOOD OR DRINK.

## 2014-01-31 ENCOUNTER — Other Ambulatory Visit: Payer: Self-pay | Admitting: *Deleted

## 2014-01-31 ENCOUNTER — Other Ambulatory Visit: Payer: Self-pay | Admitting: Oncology

## 2014-01-31 ENCOUNTER — Telehealth: Payer: Self-pay | Admitting: Nurse Practitioner

## 2014-01-31 LAB — COMPLETE METABOLIC PANEL WITH GFR
ALBUMIN: 3.9 g/dL (ref 3.5–5.2)
ALT: 13 U/L (ref 0–35)
AST: 15 U/L (ref 0–37)
Alkaline Phosphatase: 91 U/L (ref 39–117)
BUN: 17 mg/dL (ref 6–23)
CALCIUM: 10 mg/dL (ref 8.4–10.5)
CHLORIDE: 101 meq/L (ref 96–112)
CO2: 28 meq/L (ref 19–32)
Creat: 0.98 mg/dL (ref 0.50–1.10)
GFR, EST AFRICAN AMERICAN: 64 mL/min
GFR, EST NON AFRICAN AMERICAN: 56 mL/min — AB
GLUCOSE: 111 mg/dL — AB (ref 70–99)
POTASSIUM: 4.4 meq/L (ref 3.5–5.3)
SODIUM: 139 meq/L (ref 135–145)
TOTAL PROTEIN: 6.8 g/dL (ref 6.0–8.3)
Total Bilirubin: 0.8 mg/dL (ref 0.2–1.2)

## 2014-01-31 LAB — CBC WITH DIFFERENTIAL/PLATELET
BASOS PCT: 0 % (ref 0–1)
Basophils Absolute: 0 10*3/uL (ref 0.0–0.1)
Eosinophils Absolute: 0.2 10*3/uL (ref 0.0–0.7)
Eosinophils Relative: 4 % (ref 0–5)
HEMATOCRIT: 38.9 % (ref 36.0–46.0)
HEMOGLOBIN: 13.1 g/dL (ref 12.0–15.0)
LYMPHS ABS: 1.6 10*3/uL (ref 0.7–4.0)
LYMPHS PCT: 30 % (ref 12–46)
MCH: 30.9 pg (ref 26.0–34.0)
MCHC: 33.7 g/dL (ref 30.0–36.0)
MCV: 91.7 fL (ref 78.0–100.0)
MONO ABS: 0.4 10*3/uL (ref 0.1–1.0)
MONOS PCT: 7 % (ref 3–12)
MPV: 9.7 fL (ref 9.4–12.4)
NEUTROS ABS: 3.1 10*3/uL (ref 1.7–7.7)
NEUTROS PCT: 59 % (ref 43–77)
Platelets: 290 10*3/uL (ref 150–400)
RBC: 4.24 MIL/uL (ref 3.87–5.11)
RDW: 13.6 % (ref 11.5–15.5)
WBC: 5.2 10*3/uL (ref 4.0–10.5)

## 2014-01-31 LAB — LIPID PANEL
CHOL/HDL RATIO: 4.3 ratio
Cholesterol: 234 mg/dL — ABNORMAL HIGH (ref 0–200)
HDL: 55 mg/dL (ref >39)
LDL Cholesterol: 146 mg/dL — ABNORMAL HIGH (ref 0–99)
Triglycerides: 166 mg/dL — ABNORMAL HIGH (ref <150)
VLDL: 33 mg/dL (ref 0–40)

## 2014-01-31 LAB — THYROID PANEL WITH TSH
Free Thyroxine Index: 1.6 (ref 1.4–3.8)
T3 Uptake: 28 % (ref 22–35)
T4 TOTAL: 5.7 ug/dL (ref 4.5–12.0)
TSH: 3.97 u[IU]/mL (ref 0.350–4.500)

## 2014-01-31 NOTE — Telephone Encounter (Signed)
, °

## 2014-02-01 ENCOUNTER — Encounter: Payer: Self-pay | Admitting: Family Medicine

## 2014-02-01 NOTE — Progress Notes (Signed)
Quick Note:  Please advise pt regarding following labs... All labs look good except cholesterol panel shows above normal total and LDL ("bad") cholesterol levels. The good news- the numbers are better than 1 year ago.  I spoke with you about the results and possibly increasing simvastatin but you decided to continue with current medications. You will focus on better nutrition and staying active.  Copy to pt. ______

## 2014-02-02 ENCOUNTER — Encounter: Payer: Self-pay | Admitting: Radiology

## 2014-02-03 ENCOUNTER — Telehealth: Payer: Self-pay

## 2014-02-03 NOTE — Telephone Encounter (Signed)
Pt states she has enough anastrazole to last until she sees the MD on 12/29. She will ask for a refill at that time.

## 2014-02-03 NOTE — Telephone Encounter (Signed)
lvm for pt to call back. She has a refill request for arimadex. She has not seen MD since 10/2012. Her next appt is 02/22/14. Does she have enough arimadex until then? (19 days).

## 2014-02-07 ENCOUNTER — Other Ambulatory Visit: Payer: Self-pay

## 2014-02-07 DIAGNOSIS — Z0000 Encounter for general adult medical examination without abnormal findings: Secondary | ICD-10-CM

## 2014-02-07 LAB — IFOBT (OCCULT BLOOD): IFOBT: NEGATIVE

## 2014-02-22 ENCOUNTER — Other Ambulatory Visit: Payer: Self-pay | Admitting: *Deleted

## 2014-02-22 ENCOUNTER — Telehealth: Payer: Self-pay | Admitting: Oncology

## 2014-02-22 ENCOUNTER — Other Ambulatory Visit (HOSPITAL_BASED_OUTPATIENT_CLINIC_OR_DEPARTMENT_OTHER): Payer: Medicare Other

## 2014-02-22 ENCOUNTER — Ambulatory Visit (HOSPITAL_BASED_OUTPATIENT_CLINIC_OR_DEPARTMENT_OTHER): Payer: Medicare Other | Admitting: Nurse Practitioner

## 2014-02-22 ENCOUNTER — Encounter: Payer: Self-pay | Admitting: Nurse Practitioner

## 2014-02-22 VITALS — BP 156/70 | HR 62 | Temp 98.3°F | Resp 17 | Ht 64.0 in | Wt 163.9 lb

## 2014-02-22 DIAGNOSIS — Z170 Estrogen receptor positive status [ER+]: Secondary | ICD-10-CM

## 2014-02-22 DIAGNOSIS — Z853 Personal history of malignant neoplasm of breast: Secondary | ICD-10-CM

## 2014-02-22 DIAGNOSIS — C50911 Malignant neoplasm of unspecified site of right female breast: Secondary | ICD-10-CM

## 2014-02-22 DIAGNOSIS — C50119 Malignant neoplasm of central portion of unspecified female breast: Secondary | ICD-10-CM

## 2014-02-22 DIAGNOSIS — C50011 Malignant neoplasm of nipple and areola, right female breast: Secondary | ICD-10-CM

## 2014-02-22 DIAGNOSIS — C50919 Malignant neoplasm of unspecified site of unspecified female breast: Secondary | ICD-10-CM

## 2014-02-22 LAB — COMPREHENSIVE METABOLIC PANEL (CC13)
ALBUMIN: 3.7 g/dL (ref 3.5–5.0)
ALK PHOS: 97 U/L (ref 40–150)
ALT: 13 U/L (ref 0–55)
AST: 17 U/L (ref 5–34)
Anion Gap: 11 mEq/L (ref 3–11)
BUN: 20.6 mg/dL (ref 7.0–26.0)
CO2: 27 mEq/L (ref 22–29)
Calcium: 9.5 mg/dL (ref 8.4–10.4)
Chloride: 101 mEq/L (ref 98–109)
Creatinine: 0.9 mg/dL (ref 0.6–1.1)
EGFR: 61 mL/min/{1.73_m2} — ABNORMAL LOW (ref >90)
Glucose: 101 mg/dl (ref 70–140)
POTASSIUM: 3.8 meq/L (ref 3.5–5.1)
Sodium: 138 mEq/L (ref 136–145)
Total Bilirubin: 0.59 mg/dL (ref 0.20–1.20)
Total Protein: 6.9 g/dL (ref 6.4–8.3)

## 2014-02-22 LAB — CBC WITH DIFFERENTIAL/PLATELET
BASO%: 0.3 % (ref 0.0–2.0)
BASOS ABS: 0 10*3/uL (ref 0.0–0.1)
EOS%: 1.8 % (ref 0.0–7.0)
Eosinophils Absolute: 0.1 10*3/uL (ref 0.0–0.5)
HCT: 37.7 % (ref 34.8–46.6)
HGB: 12.9 g/dL (ref 11.6–15.9)
LYMPH#: 1.9 10*3/uL (ref 0.9–3.3)
LYMPH%: 30.5 % (ref 14.0–49.7)
MCH: 31.3 pg (ref 25.1–34.0)
MCHC: 34.2 g/dL (ref 31.5–36.0)
MCV: 91.5 fL (ref 79.5–101.0)
MONO#: 0.6 10*3/uL (ref 0.1–0.9)
MONO%: 9 % (ref 0.0–14.0)
NEUT#: 3.6 10*3/uL (ref 1.5–6.5)
NEUT%: 58.4 % (ref 38.4–76.8)
PLATELETS: 261 10*3/uL (ref 145–400)
RBC: 4.12 10*6/uL (ref 3.70–5.45)
RDW: 12.8 % (ref 11.2–14.5)
WBC: 6.2 10*3/uL (ref 3.9–10.3)

## 2014-02-22 MED ORDER — ANASTROZOLE 1 MG PO TABS: 1.0000 mg | ORAL_TABLET | Freq: Every day | ORAL | Status: DC

## 2014-02-22 NOTE — Progress Notes (Signed)
[facility]  [phone] [phone]  OFFICE PROGRESS NOTE   ID: Molly Lambert   DOB: 1934-12-14  MR#: [id number]  MAU#:[id number]   PCP: Ellsworth Lennox, MD SU: Erroll Luna, M.D. RAD ONC: Eppie Gibson, M.D.  CHIEF COMPLAINT: hx right breast cancer CURRENT TREATMENT: anastrozole daily  BREAST CANCER HISTORY: From Dr. Collier Salina Rubin's new patient evaluation note dated 02/28/2011: "Palpable breast mass noted x 6 months. She is a Molly 79 yo woman with no significant past medical history, noted crusting of lt nipple ~ 6 months with some thickening of that nipple/areola. She underwent mammography on 11/16/10, which showed erythema and thickening of the nipple. U/s showed mixed echogenicity of the nipple and a fairly superficial lesion. A punch biopsy of the area on 11/28/10 showed invasive cancer , Grade 2, er 100%, pr 69%, ki 67 6%, her 2 1.33.  A MRI of both breasts on 12/13/10, showed  enhancement of the area measuring 1.5 x 2.3 x 1.4 cm.   She underwent lumpectomy on 01/22/11, which showed a 1.5 cm G2 lesion with dermal involvement, 1 negative sentinel node and clear margins."  Her subsequent history is as detailed below.   INTERVAL HISTORY: Molly Lambert returns today for follow up of her right breast cancer. She has been on anastrozole since February 2013 and is tolerating the drug well with no side effects that she is aware of. She specifically denies hot flashes, vaginal changes, or an increase to the arthritis symptoms that she had at baseline. The interval history is generally unremarkable. 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   REVIEW OF SYSTEMS: Molly Lambert denies fevers, chills, nausea, or vomiting. She says that if she doesn't "eat right" she can have some constipation, but this is generally not the case. Her appetite is healthy and she is drinking well. She takes ibuprofen  PRN for her arthritis but was recently advised to take extra strength tylenol as well. Her energy level is fair. She denies shortness of breath, chest pain, cough, or palpitations. She denies headaches, dizziness, unexplained weight loss, or night sweats. A detailed review of systems is otherwise negative.    PAST MEDICAL HISTORY: Past Medical History  Diagnosis Date  . Hypertension     NO CARDIAC MD,  [facility]  PCP  ,  URGENT MED  . Cataract   . Leg laceration     WITH SURGERY  . Breast cancer 11/2010    Rt breast  . S/P radiation therapy 03/14/11 - 04/05/11    Right Breast / 4256 cGy in 16 Fractions  . Thyroid disease   . Hypercholesteremia     PAST SURGICAL HISTORY: Past Surgical History  Procedure Laterality Date  . Tubal ligation  1972  . Eye surgery       BILATERAL CATARACT SURGERY  . Breast surgery  11/28/10    SKIN-PUNCH BIOSPY RIGHT BREAST(NIPPLE), ER+, RP+, LOW S-PHASE, HER 2NEU NEGAITIVE  . Breast lumpectomy  01/22/11    RIGHT BREAST LUMPECTOMY WITH BIOSPY OF 1 SENTINEL NODE, INVASIVE GRADE II DUCTAL CARCINOMA , INTERMEDIATE GRADE DUCTAL CARCINOMA IN SITU , DERMAL SKIN INVOLVED, MARGINS NEGATIVE,( 0/1)  NODE POSITIVE, ER+, PR+, LOW s-PHASE, HER 2 NEU- NO AMPLIFICATION  . Partial mastectomy with needle localization  01/10/2012    Procedure: PARTIAL MASTECTOMY WITH NEEDLE LOCALIZATION;  Surgeon: Marcello Moores A. Cornett, MD;  Location: [facility];  Service: General;  Laterality: Right;  right breast needle localized partial mastectomy    FAMILY HISTORY Family History  Problem Relation Age of Onset  . Kidney disease Mother     kidney failure   . Kidney failure Mother   . Stroke Mother   . Heart disease Father     heart attack   . Heart attack Father   . Lymphoma Sister   . Pancreatitis Sister     GYNECOLOGIC HISTORY: Gravida 3, para 2, one stillbirth, age of menarche 79, H.  He 76, age of menopause 17, no history of birth control use or hormonal  replacement therapy.  SOCIAL HISTORY: Mr. and Molly Lambert has been married since 1957.  Her husband Molly Lambert is a retired Dealer.  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 She and her husband have 2 adult children, a son that is 33 years old and her daughter that is 14 years old.  Her son resides in [place], [place] and her daughter resides in [place], [place].  They have 6 grandchildren and 4 great-grandchildren.  In her spare time Molly Lambert enjoys spending time with her family, cooking, traveling, and gardening.   ADVANCED DIRECTIVES: Not on file  HEALTH MAINTENANCE: History  Substance Use Topics  . Smoking status: Never Smoker   . Smokeless tobacco: Never Used  . Alcohol Use: Yes     Comment: Rarely/On Special Occasions    Colonoscopy: Not on file PAP: Not on file Bone density: The patient's last bone density scan on 03/26/2011 showed a T score of -0.3 (normal). Lipid panel: 11/10/2012  No Known Allergies  Current Outpatient Prescriptions  Medication Sig Dispense Refill  . aspirin EC 81 MG tablet Take 81 mg by mouth daily.      Marland Kitchen atenolol-chlorthalidone (TENORETIC) 50-25 MG per tablet Take 1 tablet by mouth daily. 30 tablet 11  . Cholecalciferol (VITAMIN D) 2000 UNITS CAPS Take 1 capsule by mouth daily.    Marland Kitchen levothyroxine (SYNTHROID, LEVOTHROID) 75 MCG tablet Take 1 tablet (75 mcg total) by mouth daily before breakfast. 30 tablet 3  . simvastatin (ZOCOR) 20 MG tablet One daily for cholesterol 30 tablet 5  . anastrozole (ARIMIDEX) 1 MG tablet Take 1 tablet (1 mg total) by mouth daily. 90 tablet 4   No current facility-administered medications for this visit.    OBJECTIVE: Filed Vitals:   02/22/14 1311  BP: 156/70  Pulse: 62  Temp: 98.3 F (36.8 C)  Resp: 17     Body mass index is 28.12 kg/(m^2).      ECOG FS: 1 - Symptomatic  but completely ambulatory  Skin: warm, dry  HEENT: sclerae anicteric, conjunctivae pink, oropharynx clear. No thrush or mucositis.  Lymph Nodes: No cervical or supraclavicular lymphadenopathy  Lungs: clear to auscultation bilaterally, no rales, wheezes, or rhonci  Heart: regular rate and rhythm  Abdomen: round, soft, non tender, positive bowel sounds  Musculoskeletal: No focal spinal tenderness, no peripheral edema  Neuro: non focal, well oriented, positive affect  Breasts: right breast status post lumpectomy and nipple removal. No evidence of local recurrence. Right axilla benign. Left breast unremarkable.   LAB RESULTS: Lab Results  Component Value Date   WBC 6.2 02/22/2014   NEUTROABS 3.6 02/22/2014   HGB 12.9 02/22/2014   HCT 37.7 02/22/2014   MCV 91.5 02/22/2014   PLT 261 02/22/2014  Chemistry      Component Value Date/Time   NA 138 02/22/2014 1235   NA 139 01/31/2014 0800   K 3.8 02/22/2014 1235   K 4.4 01/31/2014 0800   CL 101 01/31/2014 0800   CO2 27 02/22/2014 1235   CO2 28 01/31/2014 0800   BUN 20.6 02/22/2014 1235   BUN 17 01/31/2014 0800   CREATININE 0.9 02/22/2014 1235   CREATININE 0.98 01/31/2014 0800   CREATININE 0.78 01/08/2012 1030      Component Value Date/Time   CALCIUM 9.5 02/22/2014 1235   CALCIUM 10.0 01/31/2014 0800   ALKPHOS 97 02/22/2014 1235   ALKPHOS 91 01/31/2014 0800   AST 17 02/22/2014 1235   AST 15 01/31/2014 0800   ALT 13 02/22/2014 1235   ALT 13 01/31/2014 0800   BILITOT 0.59 02/22/2014 1235   BILITOT 0.8 01/31/2014 0800       Lab Results  Component Value Date   LABCA2 41* 02/28/2011    Urinalysis No results found for: COLORURINE  STUDIES: Most recent mammogram on 12/31/13 was unremarkable.  Most recent bone density scan on 03/26/11 showed a t-score of -1.4 (osteopenia)  ASSESSMENT: 79 y.o. [place], [place] woman: 1.  Status post skin punch breast biopsy on 11/28/2010 which showed invasive ductal  carcinoma grade 1-2.  2.  The patient had bilateral breast MRI on 12/13/2010 which showed there is asymmetric persistent enhancement of the right nipple and subareolar breast parenchyma.  This measures 1.5 x 2.3 x 1.4 cm.  No other areas of suspicious enhancement are identified in either breast.  No enlarged internal mammary or axillary lymph nodes are identified (clinical stage IIA, T2 N0).  3.  Status post right breast lumpectomy with right sentinel node biopsy on 01/22/2011 for a stage I, pT1c, pN0, pMX, 1.5 cm invasive ductal carcinoma grade 2, with intermediate grade ductal carcinoma in situ, and negative margins, estrogen receptor 100% positive, progesterone receptor 69% positive, Ki-67 60%, HER-2/neu by CISH no amplification with a ratio 1.21, with 0/1 metastatic right axillary lymph nodes.  4.  The patient underwent radiation therapy from 03/14/2011 through 04/05/2011.  5.  The patient started antiestrogen therapy with Arimidex in 03/2011.  6.  Status post right breast needle core biopsy at the 12 o'clock position on 12/23/2011 which showed atypical ductal hyperplasia with calcifications, fibrocystic changes with calcifications, and stromal calcifications.  7.  Status post right breast lumpectomy on 01/10/2012 which showed fibrocystic changes, benign calcifications, healing biopsy site, no evidence of malignancy.  PLAN: Molly Lambert is doing well as far as her breast cancer is concerned. She is now 3 years out from her definitive surgery with no evidence of recurrent disease. The labs were reviewed in detail and were entirely normal. She is tolerating the anastrozole well and the plan is to continue this drug until at least February 2018 for a total of 5 years of antiestrogen therapy.   Molly Lambert is overdue for her repeat bone density scan. I have placed orders for this procedure to be performed in the next month.   Molly Lambert will return for labs and a follow up visit in 1 year. She understands and  agrees with this plan. She knows the goal of treatment in her case is cure. She has been encouraged to call with any issues that might arise before her next visit here.   Marcelino Duster, NP  02/22/2014

## 2014-02-22 NOTE — Telephone Encounter (Signed)
, °

## 2014-02-28 ENCOUNTER — Other Ambulatory Visit: Payer: Self-pay | Admitting: Nurse Practitioner

## 2014-02-28 DIAGNOSIS — M8580 Other specified disorders of bone density and structure, unspecified site: Secondary | ICD-10-CM

## 2014-03-04 ENCOUNTER — Other Ambulatory Visit: Payer: Medicare Other

## 2014-03-04 ENCOUNTER — Ambulatory Visit
Admission: RE | Admit: 2014-03-04 | Discharge: 2014-03-04 | Disposition: A | Discharge status: Home / Self Care | Payer: Medicare Other | Source: Ambulatory Visit | Attending: Nurse Practitioner | Admitting: Nurse Practitioner

## 2014-03-04 DIAGNOSIS — M8580 Other specified disorders of bone density and structure, unspecified site: Secondary | ICD-10-CM

## 2014-03-06 NOTE — Progress Notes (Signed)
Quick Note:  Advise pt that her recent bone density study shows LOW BONE MASS. This may be due in part to the Arimidex she is taking.   She should take Calcium 1200 mg daily in addition to the Vitamin D she is taking. She may want to switch to Citracal + D or Oscal + D which will provide the calcium and Vitamin D in a single tablet. The usual dose is 1 tablet twice a day, regardless of which supplement she chooses. ______

## 2014-06-22 ENCOUNTER — Other Ambulatory Visit: Payer: Self-pay | Admitting: Family Medicine

## 2014-06-25 ENCOUNTER — Telehealth: Payer: Self-pay | Admitting: Family Medicine

## 2014-06-25 NOTE — Telephone Encounter (Signed)
lmom to reschedule her appt that she had with [facility] please schedule her with any provider for a 6 month follow up HTN/thyroid

## 2014-07-07 ENCOUNTER — Encounter: Payer: Self-pay | Admitting: *Deleted

## 2014-07-09 ENCOUNTER — Emergency Department (HOSPITAL_COMMUNITY)
Admission: EM | Admit: 2014-07-09 | Discharge: 2014-07-09 | Disposition: A | Discharge status: Home / Self Care | Payer: Medicare Other | Source: Home / Self Care | Attending: Emergency Medicine | Admitting: Emergency Medicine

## 2014-07-09 DIAGNOSIS — L0100 Impetigo, unspecified: Secondary | ICD-10-CM

## 2014-07-09 MED ORDER — MUPIROCIN 2 % EX OINT: 1.0000 "application " | TOPICAL_OINTMENT | Freq: Two times a day (BID) | CUTANEOUS | Status: DC

## 2014-07-09 NOTE — ED Provider Notes (Signed)
CSN: [id number]     Arrival date & time 07/09/14  1016 History   First MD Initiated Contact with Patient 07/09/14 1037     Chief Complaint  Patient presents with  . Rash   (Consider location/radiation/quality/duration/timing/severity/associated sxs/prior Treatment) HPI Comments: Rash on upper lip and at entrance to both nostrils. Rash has not responded favorably to at home remedies.   Patient is a 80 y.o. female presenting with rash. The history is provided by the patient.  Rash   Past Medical History  Diagnosis Date  . Hypertension     NO CARDIAC MD,  [facility]  PCP  ,  URGENT MED  . Cataract   . Leg laceration     WITH SURGERY  . Breast cancer 11/2010    Rt breast  . S/P radiation therapy 03/14/11 - 04/05/11    Right Breast / 4256 cGy in 16 Fractions  . Thyroid disease   . Hypercholesteremia    Past Surgical History  Procedure Laterality Date  . Tubal ligation  1972  . Eye surgery       BILATERAL CATARACT SURGERY  . Breast surgery  11/28/10    SKIN-PUNCH BIOSPY RIGHT BREAST(NIPPLE), ER+, RP+, LOW S-PHASE, HER 2NEU NEGAITIVE  . Breast lumpectomy  01/22/11    RIGHT BREAST LUMPECTOMY WITH BIOSPY OF 1 SENTINEL NODE, INVASIVE GRADE II DUCTAL CARCINOMA , INTERMEDIATE GRADE DUCTAL CARCINOMA IN SITU , DERMAL SKIN INVOLVED, MARGINS NEGATIVE,( 0/1)  NODE POSITIVE, ER+, PR+, LOW s-PHASE, HER 2 NEU- NO AMPLIFICATION  . Partial mastectomy with needle localization  01/10/2012    Procedure: PARTIAL MASTECTOMY WITH NEEDLE LOCALIZATION;  Surgeon: Marcello Moores A. Cornett, MD;  Location: [facility];  Service: General;  Laterality: Right;  right breast needle localized partial mastectomy   Family History  Problem Relation Age of Onset  . Kidney disease Mother     kidney failure   . Kidney failure Mother   . Stroke Mother   . Heart disease Father     heart attack   . Heart attack Father   . Lymphoma Sister   . Pancreatitis Sister    History  Substance Use Topics  . Smoking  status: Never Smoker   . Smokeless tobacco: Never Used  . Alcohol Use: Yes     Comment: Rarely/On Special Occasions   OB History    No data available     Review of Systems  Skin: Positive for rash.  All other systems reviewed and are negative.   Allergies  Review of patient's allergies indicates no known allergies.  Home Medications   Prior to Admission medications   Medication Sig Start Date End Date Taking? Authorizing Provider  anastrozole (ARIMIDEX) 1 MG tablet Take 1 tablet (1 mg total) by mouth daily. 02/22/14   Laurie Panda, NP  aspirin EC 81 MG tablet Take 81 mg by mouth daily.      Historical Provider, MD  atenolol-chlorthalidone (TENORETIC) 50-25 MG per tablet Take 1 tablet by mouth daily. 01/28/14   Barton Fanny, MD  Cholecalciferol (VITAMIN D) 2000 UNITS CAPS Take 1 capsule by mouth daily.    Historical Provider, MD  levothyroxine (SYNTHROID, LEVOTHROID) 75 MCG tablet take 1 tablet by mouth once daily BEFORE BREAKFAST 06/23/14   Barton Fanny, MD  mupirocin ointment (BACTROBAN) 2 % Place 1 application into the nose 2 (two) times daily. X 7 days 07/09/14   Lutricia Feil, PA  simvastatin (ZOCOR) 20 MG tablet One daily for  cholesterol 01/28/14   Barton Fanny, MD   BP 166/75 mmHg  Pulse 66  Temp(Src) 98.1 F (36.7 C) (Oral)  Resp 16  SpO2 95% Physical Exam  Constitutional: She is oriented to person, place, and time. She appears well-developed and well-nourished. No distress.  HENT:  Head: Normocephalic and atraumatic.  Right Ear: Hearing and external ear normal.  Left Ear: Hearing and external ear normal.  Nose:    Mouth/Throat: Uvula is midline, oropharynx is clear and moist and mucous membranes are normal.  Outlined area with impetigo  Eyes: Conjunctivae are normal.  Neck: Normal range of motion. Neck supple.  Cardiovascular: Normal rate.   Pulmonary/Chest: Effort normal.  Musculoskeletal: Normal range of motion.    Neurological: She is alert and oriented to person, place, and time.  Skin: Skin is warm and dry. Rash noted. No erythema.  See ENT  Psychiatric: She has a normal mood and affect. Her behavior is normal.  Nursing note and vitals reviewed.   ED Course  Procedures (including critical care time) Labs Review Labs Reviewed - No data to display  Imaging Review No results found.   MDM   1. Impetigo    Bactroban as directed    Lutricia Feil, PA 07/09/14 1110

## 2014-07-09 NOTE — ED Notes (Signed)
Rash under nose 2-3 days. Pt c/o watery eyes, cough and scratchy throat.

## 2014-07-29 ENCOUNTER — Ambulatory Visit: Payer: Medicare Other | Admitting: Family Medicine

## 2014-08-11 ENCOUNTER — Encounter (HOSPITAL_COMMUNITY): Payer: Self-pay | Admitting: Emergency Medicine

## 2014-08-11 ENCOUNTER — Emergency Department (INDEPENDENT_AMBULATORY_CARE_PROVIDER_SITE_OTHER)
Admission: EM | Admit: 2014-08-11 | Discharge: 2014-08-11 | Disposition: A | Discharge status: Home / Self Care | Payer: Medicare Other | Source: Home / Self Care | Attending: Family Medicine | Admitting: Family Medicine

## 2014-08-11 DIAGNOSIS — J3489 Other specified disorders of nose and nasal sinuses: Secondary | ICD-10-CM

## 2014-08-11 DIAGNOSIS — L219 Seborrheic dermatitis, unspecified: Secondary | ICD-10-CM

## 2014-08-11 MED ORDER — KETOCONAZOLE 2 % EX CREA: 1.0000 "application " | TOPICAL_CREAM | Freq: Every day | CUTANEOUS | Status: DC

## 2014-08-11 MED ORDER — IPRATROPIUM BROMIDE 0.06 % NA SOLN: 2.0000 | Freq: Four times a day (QID) | NASAL | Status: DC

## 2014-08-11 NOTE — ED Notes (Signed)
Pt states that she has impetigo on her face under nose and it has not gone away, she here to be seen for re-check of rash

## 2014-08-11 NOTE — Discharge Instructions (Signed)
Thank you for coming in today. Use the cream Use the nasal spray.  Return as needed.   Seborrheic Dermatitis Seborrheic dermatitis involves pink or red skin with greasy, flaky scales. This is often found on the scalp, eyebrows, nose, bearded area, and on or behind the ears. It can also occur on the central chest. It often occurs where there are more oil (sebaceous) glands. This condition is also known as dandruff. When this condition affects a baby's scalp, it is called cradle cap. It may come and go for no known reason. It can occur at any time of life from infancy to old age. CAUSES  The cause is unknown. It is not the result of too little moisture or too much oil. In some people, seborrheic dermatitis flare-ups seem to be triggered by stress. It also commonly occurs in people with certain diseases such as Parkinson's disease or HIV/AIDS. SYMPTOMS   Thick scales on the scalp.  Redness on the face or in the armpits.  The skin may seem oily or dry, but moisturizers do not help.  In infants, seborrheic dermatitis appears as scaly redness that does not seem to bother the baby. In some babies, it affects only the scalp. In others, it also affects the neck creases, armpits, groin, or behind the ears.  In adults and adolescents, seborrheic dermatitis may affect only the scalp. It may look patchy or spread out, with areas of redness and flaking. Other areas commonly affected include:  Eyebrows.  Eyelids.  Forehead.  Skin behind the ears.  Outer ears.  Chest.  Armpits.  Nose creases.  Skin creases under the breasts.  Skin between the buttocks.  Groin.  Some adults and adolescents feel itching or burning in the affected areas. DIAGNOSIS  Your caregiver can usually tell what the problem is by doing a physical exam. TREATMENT   Cortisone (steroid) ointments, creams, and lotions can help decrease inflammation.  Babies can be treated with baby oil to soften the scales, then they  may be washed with baby shampoo. If this does not help, a prescription topical steroid medicine may work.  Adults can use medicated shampoos.  Your caregiver may prescribe corticosteroid cream and shampoo containing an antifungal or yeast medicine (ketoconazole). Hydrocortisone or anti-yeast cream can be rubbed directly onto seborrheic dermatitis patches. Yeast does not cause seborrheic dermatitis, but it seems to add to the problem. In infants, seborrheic dermatitis is often worst during the first year of life. It tends to disappear on its own as the child grows. However, it may return during the teenage years. In adults and adolescents, seborrheic dermatitis tends to be a long-lasting condition that comes and goes over many years. HOME CARE INSTRUCTIONS   Use prescribed medicines as directed.  In infants, do not aggressively remove the scales or flakes on the scalp with a comb or by other means. This may lead to hair loss. SEEK MEDICAL CARE IF:   The problem does not improve from the medicated shampoos, lotions, or other medicines given by your caregiver.  You have any other questions or concerns. Document Released: 02/11/2005 Document Revised: 08/13/2011 Document Reviewed: 07/03/2009 [facility] Patient Information 2015 [place], [place]. This information is not intended to replace advice given to you by your health care provider. Make sure you discuss any questions you have with your health care provider.

## 2014-08-11 NOTE — ED Provider Notes (Signed)
Molly Lambert is a 80 y.o. female who presents to Urgent Care today for skin irritation. Patient has irritated skin inferior to her nose. She was seen a month ago and treated with mupirocin ointment for impetigo. This helped however she continues to have a mildly itchy patch of skin just inferior to her nose. She notes a chronic runny nose as well. No fevers or chills vomiting or diarrhea. No pain.   Past Medical History  Diagnosis Date  . Hypertension     NO CARDIAC MD,  [facility]  PCP  ,  [facility]  . Cataract   . Leg laceration     WITH SURGERY  . Breast cancer 11/2010    Rt breast  . S/P radiation therapy 03/14/11 - 04/05/11    Right Breast / 4256 cGy in 16 Fractions  . Thyroid disease   . Hypercholesteremia    Past Surgical History  Procedure Laterality Date  . Tubal ligation  1972  . Eye surgery       BILATERAL CATARACT SURGERY  . Breast surgery  11/28/10    SKIN-PUNCH BIOSPY RIGHT BREAST(NIPPLE), ER+, RP+, LOW S-PHASE, HER 2NEU NEGAITIVE  . Breast lumpectomy  01/22/11    RIGHT BREAST LUMPECTOMY WITH BIOSPY OF 1 SENTINEL NODE, INVASIVE GRADE II DUCTAL CARCINOMA , INTERMEDIATE GRADE DUCTAL CARCINOMA IN SITU , DERMAL SKIN INVOLVED, MARGINS NEGATIVE,( 0/1)  NODE POSITIVE, ER+, PR+, LOW s-PHASE, HER 2 NEU- NO AMPLIFICATION  . Partial mastectomy with needle localization  01/10/2012    Procedure: PARTIAL MASTECTOMY WITH NEEDLE LOCALIZATION;  Surgeon: Marcello Moores A. Cornett, MD;  Location: [facility];  Service: General;  Laterality: Right;  right breast needle localized partial mastectomy   History  Substance Use Topics  . Smoking status: Never Smoker   . Smokeless tobacco: Never Used  . Alcohol Use: Yes     Comment: Rarely/On Special Occasions   ROS as above Medications: No current facility-administered medications for this encounter.   Current Outpatient Prescriptions  Medication Sig Dispense Refill  . anastrozole (ARIMIDEX) 1 MG tablet Take 1 tablet (1 mg total) by  mouth daily. 90 tablet 4  . aspirin EC 81 MG tablet Take 81 mg by mouth daily.      Marland Kitchen atenolol-chlorthalidone (TENORETIC) 50-25 MG per tablet Take 1 tablet by mouth daily. 30 tablet 11  . Cholecalciferol (VITAMIN D) 2000 UNITS CAPS Take 1 capsule by mouth daily.    Marland Kitchen ipratropium (ATROVENT) 0.06 % nasal spray Place 2 sprays into both nostrils 4 (four) times daily. 15 mL 2  . ketoconazole (NIZORAL) 2 % cream Apply 1 application topically daily. 30 g 1  . levothyroxine (SYNTHROID, LEVOTHROID) 75 MCG tablet take 1 tablet by mouth once daily BEFORE BREAKFAST 30 tablet 1  . mupirocin ointment (BACTROBAN) 2 % Place 1 application into the nose 2 (two) times daily. X 7 days 22 g 0  . simvastatin (ZOCOR) 20 MG tablet One daily for cholesterol 30 tablet 5   No Known Allergies   Exam:  BP 133/67 mmHg  Pulse 60  Temp(Src) 98.1 F (36.7 C) (Oral)  Resp 14  SpO2 100% Gen: Well NAD HEENT: EOMI,  MMM clear nasal discharge Lungs: Normal work of breathing. CTABL Heart: RRR no MRG Abd: NABS, Soft. Nondistended, Nontender Exts: Brisk capillary refill, warm and well perfused.  Skin: Mildly erythematous macular scaly patch inferior nose. Nontender.  No results found for this or any previous visit (from the past 24 hour(s)). No results found.  Assessment and Plan: 80 y.o. female with  1) seborrheic dermatitis likely is the cause of her symptoms. Treat with ketoconazole cream 2) rhinitis chronic likely vasomotor. Treat with Atrovent nasal spray.  Follow up with PCP.  Discussed warning signs or symptoms. Please see discharge instructions. Patient expresses understanding.     Gregor Hams, MD 08/11/14 [phone]

## 2014-09-01 ENCOUNTER — Telehealth: Payer: Self-pay | Admitting: Oncology

## 2014-09-01 NOTE — Telephone Encounter (Signed)
Left message to confirm appointment change MD on PAL. Mailed calendar.

## 2014-12-29 ENCOUNTER — Other Ambulatory Visit: Payer: Self-pay | Admitting: Oncology

## 2014-12-29 ENCOUNTER — Other Ambulatory Visit: Payer: Self-pay

## 2014-12-29 DIAGNOSIS — Z853 Personal history of malignant neoplasm of breast: Secondary | ICD-10-CM

## 2015-02-07 ENCOUNTER — Ambulatory Visit (INDEPENDENT_AMBULATORY_CARE_PROVIDER_SITE_OTHER): Payer: Medicare Other | Admitting: Internal Medicine

## 2015-02-07 VITALS — BP 132/72 | HR 79 | Temp 97.9°F | Ht 64.0 in | Wt 164.0 lb

## 2015-02-07 DIAGNOSIS — E039 Hypothyroidism, unspecified: Secondary | ICD-10-CM

## 2015-02-07 DIAGNOSIS — E7800 Pure hypercholesterolemia, unspecified: Secondary | ICD-10-CM | POA: Diagnosis not present

## 2015-02-07 DIAGNOSIS — Z853 Personal history of malignant neoplasm of breast: Secondary | ICD-10-CM

## 2015-02-07 DIAGNOSIS — I10 Essential (primary) hypertension: Secondary | ICD-10-CM

## 2015-02-07 MED ORDER — SIMVASTATIN 20 MG PO TABS: ORAL_TABLET | ORAL | Status: DC

## 2015-02-07 MED ORDER — ATENOLOL-CHLORTHALIDONE 50-25 MG PO TABS: 1.0000 | ORAL_TABLET | Freq: Every day | ORAL | Status: DC

## 2015-02-07 MED ORDER — LEVOTHYROXINE SODIUM 75 MCG PO TABS: ORAL_TABLET | ORAL | Status: DC

## 2015-02-07 NOTE — Progress Notes (Signed)
Subjective:  This chart was scribed for Molly Lin, MD by Moises Blood, Medical Scribe. This patient was seen in Room 12 and the patient's care was started at 4:23 PM.    Patient ID: Molly Lambert, female    DOB: 1934-03-23, 80 y.o.   MRN: [id number] Chief Complaint  Patient presents with  . Medication Refill   HPI Molly Lambert is a 80 y.o. female who presents to [facility] for medication refill.  She is feeling generally well.   Medication Patient of Dr. Leward Quan last here December 2015 for annual wellness exam. Now runnign out of meds  Cancer She still takes arimidex for breast cancer. She gets this medication from the cancer center.   Immunizations She declines flu shot. She declines pneumonia vaccine. Declines zosta  Patient Active Problem List   Diagnosis Date Noted  . Breast microcalcifications 12/27/2011  . History of breast cancer 12/27/2011  . Hypothyroidism 08/25/2011  . Hypercholesteremia 08/25/2011  . HTN (hypertension) 08/25/2011  . Cancer of central portion of female breast ([place]) 02/27/2011  . Breast cancer, stage 1 ([place]) 02/06/2011  . Breast mass, right 11/28/2010    Current outpatient prescriptions:  .  anastrozole (ARIMIDEX) 1 MG tablet, Take 1 tablet (1 mg total) by mouth daily., Disp: 90 tablet, Rfl: 4 .  aspirin EC 81 MG tablet, Take 81 mg by mouth daily.  , Disp: , Rfl:  .  atenolol-chlorthalidone (TENORETIC) 50-25 MG per tablet, Take 1 tablet by mouth daily., Disp: 30 tablet, Rfl: 11 .  Cholecalciferol (VITAMIN D) 2000 UNITS CAPS, Take 1 capsule by mouth daily., Disp: , Rfl:  .  ipratropium (ATROVENT) 0.06 % nasal spray, Place 2 sprays into both nostrils 4 (four) times daily., Disp: 15 mL, Rfl: 2 .  ketoconazole (NIZORAL) 2 % cream, Apply 1 application topically daily., Disp: 30 g, Rfl: 1 .  levothyroxine (SYNTHROID, LEVOTHROID) 75 MCG tablet, take 1 tablet by mouth once daily BEFORE BREAKFAST, Disp: 30 tablet, Rfl: 1 .  mupirocin ointment  (BACTROBAN) 2 %, Place 1 application into the nose 2 (two) times daily. X 7 days, Disp: 22 g, Rfl: 0 .  simvastatin (ZOCOR) 20 MG tablet, One daily for cholesterol, Disp: 30 tablet, Rfl: 5  Review of Systems  Constitutional: Negative for fatigue and unexpected weight change.  Respiratory: Negative for chest tightness and shortness of breath.   Cardiovascular: Negative for chest pain, palpitations and leg swelling.  Gastrointestinal: Negative for abdominal pain and blood in stool.  Neurological: Negative for dizziness, syncope, light-headedness and headaches.       Objective:   Physical Exam  Constitutional: She is oriented to person, place, and time. She appears well-developed and well-nourished. No distress.  HENT:  Head: Normocephalic and atraumatic.  Eyes: EOM are normal. Pupils are equal, round, and reactive to light.  Neck: Neck supple.  Cardiovascular: Normal rate.   Pulmonary/Chest: Effort normal. No respiratory distress.  Musculoskeletal: Normal range of motion.  Neurological: She is alert and oriented to person, place, and time.  Skin: Skin is warm and dry.  Psychiatric: She has a normal mood and affect. Her behavior is normal.  Nursing note and vitals reviewed.  BP 132/72 mmHg  Pulse 79  Temp(Src) 97.9 F (36.6 C) (Oral)  Ht 5\' 4"  (1.626 m)  Wt 164 lb (74.39 kg)  BMI 28.14 kg/m2  SpO2 98%     Assessment & Plan:  Hypercholesteremia - Plan: simvastatin (ZOCOR) 20 MG tablet  Hypothyroidism, unspecified hypothyroidism type  Essential hypertension  History of breast cancer  Meds ordered this encounter  Medications  . atenolol-chlorthalidone (TENORETIC) 50-25 MG tablet    Sig: Take 1 tablet by mouth daily.    Dispense:  90 tablet    Refill:  0  . simvastatin (ZOCOR) 20 MG tablet    Sig: One daily for cholesterol    Dispense:  90 tablet    Refill:  0  . levothyroxine (SYNTHROID, LEVOTHROID) 75 MCG tablet    Sig: take 1 tablet by mouth once daily BEFORE  BREAKFAST    Dispense:  90 tablet    Refill:  0   Fu appt med wellness plus labs in 3 mos by appt   By signing my name below, I, Moises Blood, attest that this documentation has been prepared under the direction and in the presence of Molly Lin, MD. Electronically Signed: Moises Blood, [place]. 02/07/2015 , 4:34 PM . I have completed the patient encounter in its entirety as documented by the scribe, with editing by me where necessary. Caliph [place] P. Laney Pastor, M.D.

## 2015-02-09 ENCOUNTER — Ambulatory Visit
Admission: RE | Admit: 2015-02-09 | Discharge: 2015-02-09 | Disposition: A | Discharge status: Home / Self Care | Payer: Medicare Other | Source: Ambulatory Visit | Attending: Oncology | Admitting: Oncology

## 2015-02-09 DIAGNOSIS — Z853 Personal history of malignant neoplasm of breast: Secondary | ICD-10-CM

## 2015-02-15 ENCOUNTER — Encounter: Payer: Self-pay | Admitting: Family Medicine

## 2015-02-17 ENCOUNTER — Encounter: Payer: Self-pay | Admitting: Family Medicine

## 2015-02-21 ENCOUNTER — Encounter: Payer: Self-pay | Admitting: Physician Assistant

## 2015-02-21 ENCOUNTER — Ambulatory Visit (INDEPENDENT_AMBULATORY_CARE_PROVIDER_SITE_OTHER): Payer: Medicare Other | Admitting: Physician Assistant

## 2015-02-21 VITALS — BP 117/66 | HR 65 | Temp 98.0°F | Resp 16 | Ht 64.0 in | Wt 161.0 lb

## 2015-02-21 DIAGNOSIS — Z0000 Encounter for general adult medical examination without abnormal findings: Secondary | ICD-10-CM | POA: Diagnosis not present

## 2015-02-21 DIAGNOSIS — J309 Allergic rhinitis, unspecified: Secondary | ICD-10-CM

## 2015-02-21 DIAGNOSIS — M8580 Other specified disorders of bone density and structure, unspecified site: Secondary | ICD-10-CM

## 2015-02-21 DIAGNOSIS — Z853 Personal history of malignant neoplasm of breast: Secondary | ICD-10-CM | POA: Diagnosis not present

## 2015-02-21 DIAGNOSIS — L309 Dermatitis, unspecified: Secondary | ICD-10-CM | POA: Diagnosis not present

## 2015-02-21 DIAGNOSIS — E7800 Pure hypercholesterolemia, unspecified: Secondary | ICD-10-CM

## 2015-02-21 DIAGNOSIS — E039 Hypothyroidism, unspecified: Secondary | ICD-10-CM | POA: Diagnosis not present

## 2015-02-21 DIAGNOSIS — R7309 Other abnormal glucose: Secondary | ICD-10-CM

## 2015-02-21 DIAGNOSIS — H6121 Impacted cerumen, right ear: Secondary | ICD-10-CM

## 2015-02-21 DIAGNOSIS — I10 Essential (primary) hypertension: Secondary | ICD-10-CM

## 2015-02-21 LAB — LIPID PANEL
Cholesterol: 198 mg/dL (ref 125–200)
HDL: 55 mg/dL (ref >=46)
LDL CALC: 112 mg/dL (ref <130)
Total CHOL/HDL Ratio: 3.6 Ratio (ref <=5.0)
Triglycerides: 155 mg/dL — ABNORMAL HIGH (ref <150)
VLDL: 31 mg/dL — ABNORMAL HIGH (ref <30)

## 2015-02-21 LAB — COMPREHENSIVE METABOLIC PANEL
ALK PHOS: 81 U/L (ref 33–130)
ALT: 14 U/L (ref 6–29)
AST: 16 U/L (ref 10–35)
Albumin: 3.8 g/dL (ref 3.6–5.1)
BILIRUBIN TOTAL: 0.7 mg/dL (ref 0.2–1.2)
BUN: 23 mg/dL (ref 7–25)
CHLORIDE: 99 mmol/L (ref 98–110)
CO2: 30 mmol/L (ref 20–31)
Calcium: 10 mg/dL (ref 8.6–10.4)
Creat: 0.9 mg/dL (ref 0.60–0.93)
GLUCOSE: 103 mg/dL — AB (ref 65–99)
Potassium: 3.7 mmol/L (ref 3.5–5.3)
Sodium: 138 mmol/L (ref 135–146)
Total Protein: 6.9 g/dL (ref 6.1–8.1)

## 2015-02-21 LAB — POC MICROSCOPIC URINALYSIS (UMFC): Mucus: ABSENT

## 2015-02-21 LAB — CBC
HCT: 39.5 % (ref 36.0–46.0)
Hemoglobin: 13.3 g/dL (ref 12.0–15.0)
MCH: 30.6 pg (ref 26.0–34.0)
MCHC: 33.7 g/dL (ref 30.0–36.0)
MCV: 91 fL (ref 78.0–100.0)
MPV: 10.3 fL (ref 8.6–12.4)
PLATELETS: 300 10*3/uL (ref 150–400)
RBC: 4.34 MIL/uL (ref 3.87–5.11)
RDW: 13.7 % (ref 11.5–15.5)
WBC: 6.3 10*3/uL (ref 4.0–10.5)

## 2015-02-21 LAB — TSH: TSH: 0.195 u[IU]/mL — AB (ref 0.350–4.500)

## 2015-02-21 MED ORDER — VITAMIN D 50 MCG (2000 UT) PO CAPS: 1.0000 | ORAL_CAPSULE | Freq: Every day | ORAL | Status: DC

## 2015-02-21 MED ORDER — IPRATROPIUM BROMIDE 0.03 % NA SOLN: 2.0000 | Freq: Two times a day (BID) | NASAL | Status: DC

## 2015-02-21 NOTE — Patient Instructions (Addendum)
Start back on vit D every day.  Start on calcium 1200 mg a day. Put vaseline under your nose twice a day. During flares, you can apply hydrocortisone 1% twice a day for 1-2 weeks. Don't apply every single day. Do not apply any other ointments to that area of your skin. I will call you with the results of your lab tests. Follow up in 1 year or return sooner if needed.

## 2015-02-21 NOTE — Progress Notes (Signed)
[facility] [address] [place] [address] [phone]  Date:  02/21/2015   Name:  Molly Lambert   DOB:  [birth year]   MRN:  [id number]  PCP:  Ellsworth Lennox, MD    Chief Complaint: Annual Exam and Medication Refill   History of Present Illness:  This is a 80 y.o. 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  Breast cancer dx'd 2012. Sees oncology yearly. Next appt in jan 2017. Mammogram 2 weeks ago normal. Diagnostic mammo again 1 year.  Seeing dermatologist for irritation of skin under nose. States 2 months ago started. Saw a dermatologist who took a culture and said "it wasn't fungus". She has several creams at home that she tries that give her relief. She will use vaseline, neosporin and ketoconazole sporadically. She continues to have problems with irritation and itching.  Uses atrovent prn for allergic rhinitis, feels it helps her.  Saw Dr. Laney Pastor 2 weeks ago and got refills of synthroid, tenoretic, and zocor.   Last pap: been several years since last pap smear. Sexual history: Immunizations: Immunization History  Administered Date(s) Administered  . Pneumococcal Polysaccharide-23 08/23/2010  . Tdap 07/20/2009  Doesn't want prevnar or flu. States she can't afford the shingles vaccine  Dentist: regular visits. Eye: no change in vision. Both ears are bothering her a little and thinks she may have wax built up. Having a little difficulty hearing lately. Diet/Exercise: walks for exercise 2-3 days a week. Tobacco/alcohol/substance use: no/no/no Mammogram: 01/2015 Colonoscopy: 15 years ago and normal. Dexa: 2016 - osteopenia   Review of Systems:  Review of Systems  Constitutional: Negative.   HENT: Negative.   Eyes: Negative.   Respiratory: Negative.   Cardiovascular: Negative.   Gastrointestinal: Negative.   Endocrine: Negative.   Genitourinary: Negative.    Musculoskeletal: Positive for joint swelling.  Skin: Negative.   Allergic/Immunologic: Positive for environmental allergies.  Neurological: Negative.   Hematological: Negative.   Psychiatric/Behavioral: Negative.     Patient Active Problem List   Diagnosis Date Noted  . Breast microcalcifications 12/27/2011  . History of breast cancer 12/27/2011  . Hypothyroidism 08/25/2011  . Hypercholesteremia 08/25/2011  . HTN (hypertension) 08/25/2011  . Cancer of central portion of female breast ([place]) 02/27/2011  . Breast cancer, stage 1 ([place]) 02/06/2011  . Breast mass, right 11/28/2010    Prior to Admission medications   Medication Sig Start Date End Date Taking? Authorizing Provider  anastrozole (ARIMIDEX) 1 MG tablet Take 1 tablet (1 mg total) by mouth daily. 02/22/14  Yes Laurie Panda, NP  aspirin EC 81 MG tablet Take 81 mg by mouth daily.     Yes Historical Provider, MD  atenolol-chlorthalidone (TENORETIC) 50-25 MG tablet Take 1 tablet by mouth daily. 02/07/15  Yes Leandrew Koyanagi, MD  Cholecalciferol (VITAMIN D) 2000 UNITS CAPS Take 1 capsule by mouth daily.   Yes Historical Provider, MD  ipratropium (ATROVENT) 0.06 % nasal spray Place 2 sprays into both nostrils 4 (four) times daily. 08/11/14  Yes Gregor Hams, MD  ketoconazole (NIZORAL) 2 % cream Apply 1 application topically daily. 08/11/14  Yes Gregor Hams, MD  levothyroxine (SYNTHROID, LEVOTHROID) 75 MCG tablet take 1 tablet by mouth once daily BEFORE BREAKFAST 02/07/15  Yes Leandrew Koyanagi, MD  mupirocin ointment (BACTROBAN) 2 % Place 1 application into the nose 2 (two) times daily. X 7 days 07/09/14  Yes Annett Gula  H Presson, PA  simvastatin (ZOCOR) 20 MG tablet One daily for cholesterol 02/07/15  Yes Leandrew Koyanagi, MD    No Known Allergies  Past Surgical History  Procedure Laterality Date  . Tubal ligation  1972  . Eye surgery       BILATERAL CATARACT SURGERY  . Breast surgery  11/28/10    SKIN-PUNCH BIOSPY  RIGHT BREAST(NIPPLE), ER+, RP+, LOW S-PHASE, HER 2NEU NEGAITIVE  . Breast lumpectomy  01/22/11    RIGHT BREAST LUMPECTOMY WITH BIOSPY OF 1 SENTINEL NODE, INVASIVE GRADE II DUCTAL CARCINOMA , INTERMEDIATE GRADE DUCTAL CARCINOMA IN SITU , DERMAL SKIN INVOLVED, MARGINS NEGATIVE,( 0/1)  NODE POSITIVE, ER+, PR+, LOW s-PHASE, HER 2 NEU- NO AMPLIFICATION  . Partial mastectomy with needle localization  01/10/2012    Procedure: PARTIAL MASTECTOMY WITH NEEDLE LOCALIZATION;  Surgeon: Marcello Moores A. Cornett, MD;  Location: [facility];  Service: General;  Laterality: Right;  right breast needle localized partial mastectomy    Social History  Substance Use Topics  . Smoking status: Never Smoker   . Smokeless tobacco: Never Used  . Alcohol Use: No     Comment: Rarely/On Special Occasions    Family History  Problem Relation Age of Onset  . Kidney disease Mother     kidney failure   . Kidney failure Mother   . Stroke Mother   . Heart disease Father     heart attack   . Heart attack Father   . Lymphoma Sister   . Pancreatitis Sister     Medication list has been reviewed and updated.  Physical Examination:  Physical Exam  Constitutional: She is oriented to person, place, and time.  HENT:  Head: Normocephalic and atraumatic.  Right Ear: Hearing, external ear and ear canal normal.  Left Ear: Hearing, tympanic membrane, external ear and ear canal normal.  Mouth/Throat: Uvula is midline, oropharynx is clear and moist and mucous membranes are normal.  Skin underneath nose erythematous with dry flaky skin  Right TM with cerumen impaction.  Clear after lavage. Symptoms improved.  Eyes: Conjunctivae, EOM and lids are normal. Right eye exhibits no discharge. Left eye exhibits no discharge. No scleral icterus.  Neck: Trachea normal. Carotid bruit is not present. No thyromegaly present.  Cardiovascular: Normal rate, regular rhythm, normal heart sounds, intact distal pulses and normal pulses.    No murmur heard. Pulmonary/Chest: Effort normal and breath sounds normal. She has no wheezes. She has no rhonchi. She has no rales.  Abdominal: Soft. Normal appearance and bowel sounds are normal. She exhibits no abdominal bruit. There is no tenderness.  Genitourinary:  Breast exam deferred to oncology  Musculoskeletal: Normal range of motion.  Lymphadenopathy:       Head (right side): No submental, no submandibular, no tonsillar, no preauricular, no posterior auricular and no occipital adenopathy present.       Head (left side): No submental, no submandibular, no tonsillar, no preauricular, no posterior auricular and no occipital adenopathy present.    She has no cervical adenopathy.  Neurological: She is alert and oriented to person, place, and time. She has normal strength and normal reflexes. No cranial nerve deficit or sensory deficit. Coordination and gait normal.  Skin: Skin is warm, dry and intact.  Psychiatric: She has a normal mood and affect. Her speech is normal and behavior is normal. Thought content normal.   BP 117/66 mmHg  Pulse 65  Temp(Src) 98 F (36.7 C) (Oral)  Resp 16  Ht 5\' 4"  (  1.626 m)  Wt 161 lb (73.029 kg)  BMI 27.62 kg/m2  SpO2 95%   Assessment and Plan:  1. Annual physical exam - CBC - POCT Microscopic Urinalysis ([facility])  2. Hypothyroidism, unspecified hypothyroidism type - TSH  3. Hypercholesteremia - Lipid panel  4. Essential hypertension - Comprehensive metabolic panel  5. Osteopenia Start back on vit D. Advised she start on calcium 1200 mg QD as well. - VITAMIN D 25 Hydroxy (Vit-D Deficiency, Fractures) - Cholecalciferol (VITAMIN D) 2000 UNITS CAPS; Take 1 capsule (2,000 Units total) by mouth daily.  Dispense: 30 capsule; Refill: 11  6. History of breast cancer In remission. Follow up with oncology in 1 month. - Cholecalciferol (VITAMIN D) 2000 UNITS CAPS; Take 1 capsule (2,000 Units total) by mouth daily.  Dispense: 30 capsule; Refill:  11  7. Allergic rhinitis, unspecified allergic rhinitis type - ipratropium (ATROVENT) 0.03 % nasal spray; Place 2 sprays into both nostrils 2 (two) times daily. As needed  Dispense: 30 mL; Refill: 0  8. Elevated glucose - Hemoglobin A1c  9. Cerumen impaction, right Right TM clear after lavage. Symptoms improved.  10. Dermatitis Advised she stop using so many different creams as that could be what is irritating her. She will apply vaseline BID. She will apply hydrocortisone 1% BID for 1-2 weeks during flares.  Follow up in 1 year or sooner if needed.   Benjaman Pott Drenda Freeze, MHS [facility]  02/21/2015

## 2015-02-22 LAB — VITAMIN D 25 HYDROXY (VIT D DEFICIENCY, FRACTURES): VIT D 25 HYDROXY: 20 ng/mL — AB (ref 30–100)

## 2015-02-22 LAB — HEMOGLOBIN A1C
Hgb A1c MFr Bld: 6.2 % — ABNORMAL HIGH (ref <5.7)
MEAN PLASMA GLUCOSE: 131 mg/dL — AB (ref <117)

## 2015-02-23 ENCOUNTER — Ambulatory Visit: Payer: Medicare Other | Admitting: Oncology

## 2015-02-23 ENCOUNTER — Other Ambulatory Visit: Payer: Medicare Other

## 2015-03-01 ENCOUNTER — Other Ambulatory Visit: Payer: Self-pay | Admitting: *Deleted

## 2015-03-02 ENCOUNTER — Other Ambulatory Visit: Payer: Medicare Other

## 2015-03-02 ENCOUNTER — Encounter: Payer: Self-pay | Admitting: Oncology

## 2015-03-02 NOTE — Progress Notes (Signed)
No show  This encounter was created in error - please disregard.

## 2015-03-06 ENCOUNTER — Encounter: Payer: Self-pay | Admitting: Physician Assistant

## 2015-03-06 ENCOUNTER — Other Ambulatory Visit: Payer: Self-pay | Admitting: Physician Assistant

## 2015-03-06 DIAGNOSIS — E039 Hypothyroidism, unspecified: Secondary | ICD-10-CM

## 2015-03-06 MED ORDER — LEVOTHYROXINE SODIUM 50 MCG PO TABS: 50.0000 ug | ORAL_TABLET | Freq: Every day | ORAL | Status: DC

## 2015-03-14 ENCOUNTER — Telehealth: Payer: Self-pay | Admitting: Oncology

## 2015-03-14 NOTE — Telephone Encounter (Signed)
Called and left a message with a new appointment per patients voicemail

## 2015-03-16 ENCOUNTER — Other Ambulatory Visit: Payer: Self-pay | Admitting: Nurse Practitioner

## 2015-03-16 DIAGNOSIS — C50119 Malignant neoplasm of central portion of unspecified female breast: Secondary | ICD-10-CM

## 2015-04-14 ENCOUNTER — Other Ambulatory Visit: Payer: Self-pay

## 2015-04-14 DIAGNOSIS — C50919 Malignant neoplasm of unspecified site of unspecified female breast: Secondary | ICD-10-CM

## 2015-04-17 ENCOUNTER — Ambulatory Visit (HOSPITAL_BASED_OUTPATIENT_CLINIC_OR_DEPARTMENT_OTHER): Payer: Medicare Other | Admitting: Oncology

## 2015-04-17 ENCOUNTER — Telehealth: Payer: Self-pay | Admitting: Oncology

## 2015-04-17 ENCOUNTER — Other Ambulatory Visit (HOSPITAL_BASED_OUTPATIENT_CLINIC_OR_DEPARTMENT_OTHER): Payer: Medicare Other

## 2015-04-17 VITALS — BP 167/64 | HR 51 | Temp 97.7°F | Resp 18 | Ht 64.0 in | Wt 167.3 lb

## 2015-04-17 DIAGNOSIS — C50011 Malignant neoplasm of nipple and areola, right female breast: Secondary | ICD-10-CM | POA: Diagnosis not present

## 2015-04-17 DIAGNOSIS — C50119 Malignant neoplasm of central portion of unspecified female breast: Secondary | ICD-10-CM

## 2015-04-17 DIAGNOSIS — Z170 Estrogen receptor positive status [ER+]: Secondary | ICD-10-CM

## 2015-04-17 DIAGNOSIS — C50919 Malignant neoplasm of unspecified site of unspecified female breast: Secondary | ICD-10-CM

## 2015-04-17 LAB — COMPREHENSIVE METABOLIC PANEL
ALT: 14 U/L (ref 0–55)
AST: 16 U/L (ref 5–34)
Albumin: 3.6 g/dL (ref 3.5–5.0)
Alkaline Phosphatase: 99 U/L (ref 40–150)
Anion Gap: 8 mEq/L (ref 3–11)
BUN: 18.4 mg/dL (ref 7.0–26.0)
CHLORIDE: 103 meq/L (ref 98–109)
CO2: 28 meq/L (ref 22–29)
Calcium: 9.7 mg/dL (ref 8.4–10.4)
Creatinine: 1 mg/dL (ref 0.6–1.1)
EGFR: 57 mL/min/{1.73_m2} — ABNORMAL LOW (ref >90)
Glucose: 114 mg/dl (ref 70–140)
POTASSIUM: 4 meq/L (ref 3.5–5.1)
Sodium: 138 mEq/L (ref 136–145)
Total Bilirubin: 0.64 mg/dL (ref 0.20–1.20)
Total Protein: 6.9 g/dL (ref 6.4–8.3)

## 2015-04-17 LAB — CBC WITH DIFFERENTIAL/PLATELET
BASO%: 0.3 % (ref 0.0–2.0)
BASOS ABS: 0 10*3/uL (ref 0.0–0.1)
EOS%: 2.1 % (ref 0.0–7.0)
Eosinophils Absolute: 0.1 10*3/uL (ref 0.0–0.5)
HCT: 40.3 % (ref 34.8–46.6)
HGB: 13.5 g/dL (ref 11.6–15.9)
LYMPH%: 27.1 % (ref 14.0–49.7)
MCH: 30.9 pg (ref 25.1–34.0)
MCHC: 33.5 g/dL (ref 31.5–36.0)
MCV: 92.2 fL (ref 79.5–101.0)
MONO#: 0.5 10*3/uL (ref 0.1–0.9)
MONO%: 7.1 % (ref 0.0–14.0)
NEUT#: 4 10*3/uL (ref 1.5–6.5)
NEUT%: 63.4 % (ref 38.4–76.8)
Platelets: 263 10*3/uL (ref 145–400)
RBC: 4.37 10*6/uL (ref 3.70–5.45)
RDW: 13.2 % (ref 11.2–14.5)
WBC: 6.3 10*3/uL (ref 3.9–10.3)
lymph#: 1.7 10*3/uL (ref 0.9–3.3)

## 2015-04-17 MED ORDER — METRONIDAZOLE 1 % EX GEL: Freq: Every day | CUTANEOUS | Status: DC

## 2015-04-17 MED ORDER — ANASTROZOLE 1 MG PO TABS: 1.0000 mg | ORAL_TABLET | Freq: Every day | ORAL | Status: DC

## 2015-04-17 NOTE — Progress Notes (Signed)
[facility]  [phone] [phone]) [phone]  OFFICE PROGRESS NOTE   ID: Molly Lambert   DOB: [birth year]  MR#: [id number]  LTR#:[id number]   PCP: Ellsworth Lennox, MD SU: Erroll Luna, M.D. RAD ONC: Eppie Gibson, M.D.  CHIEF COMPLAINT: hx right breast cancer  CURRENT TREATMENT: anastrozole daily  BREAST CANCER HISTORY: From Dr. Collier Salina Rubin's new patient evaluation note dated 02/28/2011:  "Palpable breast mass noted x 6 months. She is a Molly 81 yo woman with no significant past medical history, noted crusting of lt nipple ~ 6 months with some thickening of that nipple/areola. She underwent mammography on 11/16/10, which showed erythema and thickening of the nipple. U/s showed mixed echogenicity of the nipple and a fairly superficial lesion. A punch biopsy of the area on 11/28/10 showed invasive cancer , Grade 2, er 100%, pr 69%, ki 67 6%, her 2 1.33.  A MRI of both breasts on 12/13/10, showed  enhancement of the area measuring 1.5 x 2.3 x 1.4 cm.   She underwent lumpectomy on 01/22/11, which showed a 1.5 cm G2 lesion with dermal involvement, 1 negative sentinel node and clear margins."   Her subsequent history is as detailed below.   INTERVAL HISTORY: Molly Lambert is here today for follow-up of her estrogen receptor positive breast cancer. She continues on anastrozole. She has no side effects from that medication that she is aware of. In particular hot flashes, vaginal dryness, and arthralgias/myalgias are not a concern she obtains an adequate price.  REVIEW OF SYSTEMS: Akeria has a rash across her nose and cheeks consistent with rosacea. His really bothers her. She has tried many things to get rid of it including Vaseline, changing soaps and so on but nothing has worked. She does have aches and pains here and there but there are not more intense or persistent than before. Aside from these issues a detailed review of systems was stable  PAST MEDICAL  HISTORY: Past Medical History  Diagnosis Date  . Hypertension     NO CARDIAC MD,  [facility]  PCP  ,  URGENT MED  . Cataract   . Leg laceration     WITH SURGERY  . Breast cancer ([place]) 11/2010    Rt breast  . S/P radiation therapy 03/14/11 - 04/05/11    Right Breast / 4256 cGy in 16 Fractions  . Thyroid disease   . Hypercholesteremia   . Arthritis     PAST SURGICAL HISTORY: Past Surgical History  Procedure Laterality Date  . Tubal ligation  1972  . Eye surgery       BILATERAL CATARACT SURGERY  . Breast surgery  11/28/10    SKIN-PUNCH BIOSPY RIGHT BREAST(NIPPLE), ER+, RP+, LOW S-PHASE, HER 2NEU NEGAITIVE  . Breast lumpectomy  01/22/11    RIGHT BREAST LUMPECTOMY WITH BIOSPY OF 1 SENTINEL NODE, INVASIVE GRADE II DUCTAL CARCINOMA , INTERMEDIATE GRADE DUCTAL CARCINOMA IN SITU , DERMAL SKIN INVOLVED, MARGINS NEGATIVE,( 0/1)  NODE POSITIVE, ER+, PR+, LOW s-PHASE, HER 2 NEU- NO AMPLIFICATION  . Partial mastectomy with needle localization  01/10/2012    Procedure: PARTIAL MASTECTOMY WITH NEEDLE LOCALIZATION;  Surgeon: Marcello Moores A. Cornett, MD;  Location: [facility];  Service: General;  Laterality: Right;  right breast needle localized partial mastectomy    FAMILY HISTORY Family History  Problem Relation Age of Onset  . Kidney disease Mother     kidney failure   . Kidney failure Mother   . Stroke Mother   . Heart disease Father  heart attack   . Heart attack Father   . Lymphoma Sister   . Pancreatitis Sister     GYNECOLOGIC HISTORY: Gravida 3, para 2, one stillbirth, age of menarche 50, H.  He 37, age of menopause 27, no history of birth control use or hormonal replacement therapy.  SOCIAL HISTORY: [name]  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 She and her  husband have 2 adult children, a son that is 43 years old and her daughter that is 41 years old.  Her son resides in [place], [place] and her daughter resides in [place], [place].  They have 6 grandchildren and 4 great-grandchildren.  In her spare time Molly Lambert enjoys spending time with her family, cooking, traveling, and gardening.   ADVANCED DIRECTIVES: Not on file  HEALTH MAINTENANCE: Social History  Substance Use Topics  . Smoking status: Never Smoker   . Smokeless tobacco: Never Used  . Alcohol Use: No     Comment: Rarely/On Special Occasions    Colonoscopy:  PAP:  Bone density: Repeat bone density 2016 was stable Lipid panel:   No Known Allergies  Current Outpatient Prescriptions  Medication Sig Dispense Refill  . anastrozole (ARIMIDEX) 1 MG tablet take 1 tablet by mouth once daily 90 tablet 1  . aspirin EC 81 MG tablet Take 81 mg by mouth daily.      Marland Kitchen atenolol-chlorthalidone (TENORETIC) 50-25 MG tablet Take 1 tablet by mouth daily. 90 tablet 0  . Cholecalciferol (VITAMIN D) 2000 UNITS CAPS Take 1 capsule (2,000 Units total) by mouth daily. 30 capsule 11  . ipratropium (ATROVENT) 0.03 % nasal spray Place 2 sprays into both nostrils 2 (two) times daily. As needed 30 mL 0  . levothyroxine (SYNTHROID, LEVOTHROID) 50 MCG tablet Take 1 tablet (50 mcg total) by mouth daily. 30 tablet 5  . simvastatin (ZOCOR) 20 MG tablet One daily for cholesterol 90 tablet 0   No current facility-administered medications for this visit.    OBJECTIVE: Older white woman who appears stated age 3 Vitals:   04/17/15 1006  BP: 167/64  Pulse: 51  Temp: 97.7 F (36.5 C)  Resp: 18     Body mass index is 28.7 kg/(m^2).      ECOG FS: 1 - Symptomatic but completely ambulatory  Sclerae unicteric, EOMs intact Oropharynx clear, slightly dry No cervical or supraclavicular adenopathy Lungs no rales or rhonchi Heart regular rate and rhythm Abd soft, nontender, positive bowel  sounds MSK no focal spinal tenderness, no upper extremity lymphedema Neuro: nonfocal, well oriented, appropriate affect Breasts: The right breast is status post central lumpectomy. There is no evidence of disease recurrence. The right axilla is benign. Left breast is unremarkable Skin: The patient has evidence of rosacea involving the medial aspect of both cheeks and a little bit over the bridge of the nose. There are no pustules   LAB RESULTS: Lab Results  Component Value Date   WBC 6.3 04/17/2015   NEUTROABS 4.0 04/17/2015   HGB 13.5 04/17/2015   HCT 40.3 04/17/2015   MCV 92.2 04/17/2015   PLT 263 04/17/2015      Chemistry      Component Value Date/Time   NA 138 02/21/2015 1525   NA 138 02/22/2014 1235   K 3.7  02/21/2015 1525   K 3.8 02/22/2014 1235   CL 99 02/21/2015 1525   CO2 30 02/21/2015 1525   CO2 27 02/22/2014 1235   BUN 23 02/21/2015 1525   BUN 20.6 02/22/2014 1235   CREATININE 0.90 02/21/2015 1525   CREATININE 0.9 02/22/2014 1235   CREATININE 0.78 01/08/2012 1030      Component Value Date/Time   CALCIUM 10.0 02/21/2015 1525   CALCIUM 9.5 02/22/2014 1235   ALKPHOS 81 02/21/2015 1525   ALKPHOS 97 02/22/2014 1235   AST 16 02/21/2015 1525   AST 17 02/22/2014 1235   ALT 14 02/21/2015 1525   ALT 13 02/22/2014 1235   BILITOT 0.7 02/21/2015 1525   BILITOT 0.59 02/22/2014 1235       Lab Results  Component Value Date   LABCA2 41* 02/28/2011    Urinalysis No results found for: COLORURINE  STUDIES: CLINICAL DATA: History of right lumpectomy for breast carcinoma in November 2012. No current complaints.  EXAM: DIGITAL DIAGNOSTIC BILATERAL MAMMOGRAM WITH 3D TOMOSYNTHESIS AND CAD  COMPARISON: Previous exam(s).  ACR Breast Density Category b: There are scattered areas of fibroglandular density.  FINDINGS: Focal fat necrosis with surrounding density and associated architectural distortion and surgical vascular clips in the upper right breast is  stable from the most recent prior study. There are no discrete masses. There are no other areas of architectural distortion. There are no suspicious calcifications.  Mammographic images were processed with CAD.  IMPRESSION: No evidence of recurrent or new breast malignancy. Benign postsurgical changes on the right.  RECOMMENDATION: Diagnostic mammography in 1 year per standard post lumpectomy protocol.  I have discussed the findings and recommendations with the patient. Results were also provided in writing at the conclusion of the visit. If applicable, a reminder letter will be sent to the patient regarding the next appointment.  BI-RADS CATEGORY 2: Benign.   Electronically Signed  By: Lajean Manes M.D.  On: 02/09/2015 11:16        CLINICAL DATA: 81 year old postmenopausal Caucasian female. Currently undergoing Arimidex therapy for breast cancer. Currently undergoing calcium and vitamin-D supplementation. Follow-up examination.  EXAM: DUAL X-RAY ABSORPTIOMETRY (DXA) FOR BONE MINERAL DENSITY  FINDINGS: AP LUMBAR SPINE (L1, L3, L4)  Bone Mineral Density (BMD): 0.999 g/cm2  Young Adult T-Score: -0.5  Z-Score: 2.1  LEFT FEMUR NECK  Bone Mineral Density (BMD): 0.649 g/cm2  Young Adult T-Score: -1.8  Z-Score: 0.4  ASSESSMENT: Patient's diagnostic category is LOW BONE MASS (OSTEOPENIA) by WHO Criteria.  FRACTURE RISK: Increased.  FRAX: Based on the [place], the 10 year probability of a major osteoporotic fracture is 13%. The 10 year probability of a hip fracture is 3.3%.  COMPARISON: 03/26/2011.  Since the baseline examination, there has been no statistically significant change in bone mineral density in the lumbar spine and a statistically significant 4.6% decrease in bone mineral density in the total left femur.  ASSESSMENT: 81 y.o. [place], [place] woman: 1.  Status post skin  punch breast biopsy on 11/28/2010 which showed invasive ductal carcinoma grade 1-2.  2.  The patient had bilateral breast MRI on 12/13/2010 which showed there is asymmetric persistent enhancement of the right nipple and subareolar breast parenchyma.  This measures 1.5 x 2.3 x 1.4 cm.  No other areas of suspicious enhancement are identified in either breast.  No enlarged internal mammary or axillary lymph nodes are identified (clinical stage IIA, T2 N0).  3.  Status post right breast lumpectomy with right sentinel  node biopsy on 01/22/2011 for a stage I, pT1c, pN0, pMX, 1.5 cm invasive ductal carcinoma grade 2, with intermediate grade ductal carcinoma in situ, and negative margins, estrogen receptor 100% positive, progesterone receptor 69% positive, Ki-67 60%, HER-2/neu by CISH no amplification with a ratio 1.21, with 0/1 metastatic right axillary lymph nodes.  4.  The patient underwent radiation therapy from 03/14/2011 through 04/05/2011.  5.  The patient started antiestrogen therapy with Arimidex in 03/2011.  6.  Status post right breast needle core biopsy at the 12 o'clock position on 12/23/2011 which showed atypical ductal hyperplasia with calcifications, fibrocystic changes with calcifications, and stromal calcifications.  7.  Status post right breast lumpectomy on 01/10/2012 which showed fibrocystic changes, benign calcifications, healing biopsy site, no evidence of malignancy.  PLAN: Breannah is now more than 4 years out from definitive surgery for her breast cancer, with no evidence of disease recurrence. This is very favorable.  She has completed 4 years of antiestrogen therapy with anastrozole. The plan is to continue one additional year, at which point she will "graduate" from follow-up here.  Her bone density is stable, which is very favorable.  I wrote her for MetroGel for her rosacea and gave her instructions on how to use it if cost is an issue she will let us know, but I am not sure  that I can find anything less expensive for her.  She knows to call for any problems that develop before her next visit here.  Chauncey Cruel, MD  04/17/2015

## 2015-04-17 NOTE — Telephone Encounter (Signed)
appt made and avs printed °

## 2015-04-22 ENCOUNTER — Other Ambulatory Visit: Payer: Self-pay | Admitting: Family Medicine

## 2015-06-22 ENCOUNTER — Ambulatory Visit (INDEPENDENT_AMBULATORY_CARE_PROVIDER_SITE_OTHER): Payer: Medicare Other | Admitting: Family Medicine

## 2015-06-22 ENCOUNTER — Ambulatory Visit (INDEPENDENT_AMBULATORY_CARE_PROVIDER_SITE_OTHER): Payer: Medicare Other

## 2015-06-22 VITALS — BP 164/88 | HR 70 | Temp 98.0°F | Ht 64.0 in | Wt 160.0 lb

## 2015-06-22 DIAGNOSIS — M25552 Pain in left hip: Secondary | ICD-10-CM

## 2015-06-22 DIAGNOSIS — E039 Hypothyroidism, unspecified: Secondary | ICD-10-CM | POA: Diagnosis not present

## 2015-06-22 DIAGNOSIS — I10 Essential (primary) hypertension: Secondary | ICD-10-CM | POA: Diagnosis not present

## 2015-06-22 DIAGNOSIS — M1612 Unilateral primary osteoarthritis, left hip: Secondary | ICD-10-CM

## 2015-06-22 DIAGNOSIS — R7303 Prediabetes: Secondary | ICD-10-CM

## 2015-06-22 DIAGNOSIS — E7800 Pure hypercholesterolemia, unspecified: Secondary | ICD-10-CM | POA: Diagnosis not present

## 2015-06-22 DIAGNOSIS — L719 Rosacea, unspecified: Secondary | ICD-10-CM

## 2015-06-22 LAB — TSH: TSH: 1.43 mIU/L

## 2015-06-22 LAB — POCT GLYCOSYLATED HEMOGLOBIN (HGB A1C): HEMOGLOBIN A1C: 6.1

## 2015-06-22 MED ORDER — METRONIDAZOLE 0.75 % EX GEL: 1.0000 "application " | Freq: Two times a day (BID) | CUTANEOUS | Status: DC

## 2015-06-22 MED ORDER — ATENOLOL-CHLORTHALIDONE 50-25 MG PO TABS: 1.0000 | ORAL_TABLET | Freq: Every day | ORAL | Status: DC

## 2015-06-22 MED ORDER — LEVOTHYROXINE SODIUM 50 MCG PO TABS: 50.0000 ug | ORAL_TABLET | Freq: Every day | ORAL | Status: DC

## 2015-06-22 MED ORDER — SIMVASTATIN 20 MG PO TABS: ORAL_TABLET | ORAL | Status: DC

## 2015-06-22 NOTE — Progress Notes (Signed)
By signing my name below I, Tereasa Coop, attest that this documentation has been prepared under the direction and in the presence of Wendie Agreste, MD. Electonically Signed. Tereasa Coop, Scribe 06/22/2015 at 12:28 PM   Subjective:    Patient ID: Molly Lambert, female    DOB: [birth year], 81 y.o.   MRN: [id number]  Chief Complaint  Patient presents with  . Medication Refill    atenolol 50/25, Atrovent nasal spray, simvastatin  . Leg Pain    L/hip to ankle pain x 1 year - getting worse  . Joint Swelling    bilateral ankle swelling at times  . Rosacea    flare ups on upper lip - given a med and could not afford- using vasoline  . Hypertension    elevated at [place].      HPI Molly Lambert is a 81 y.o. female who presents to the [facility] complaining of worsening left hip to left ankle pain for the past year. Pt states the pain is in the anterior bone of hip and occasionally radiates down her leg. Pt states she has been taking extra strength tylenol BID with mild relief of pain. Pt denies any back pain, leg weakness, urinary/bowel incontinence, numbness or tingling, or saddle anesthesia. 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  bilat ankle swelling History of hypothyroidism last TSH was too low in December at 0.195 advised to repeat TSH after lower dose of Synthroid to 108mcg. Pt states that swelling is improved currently. Pt states that she noticed swelling occurred when she ran out of her HTN medication for 3 days. Pt states that her weight has been stable.  HTN Pt takes tenoretic 50/25 mg a day. Pt states she has been taking her medication for the past 3 days and did not take any today because she is out. Pt ran out of medication for 3 days, 6 days ago.    Lab Results  Component Value Date   CREATININE 1.0 04/17/2015   Wt Readings from Last 3 Encounters:  06/22/15 160 lb (72.576 kg)  04/17/15 167 lb 4.8 oz (75.887 kg)  02/21/15 161 lb (73.029  kg)     Allergic rhinitis  Uses a nasal spray every once in a while.   HLD Lab Results  Component Value Date   CHOL 198 02/21/2015   HDL 55 02/21/2015   LDLCALC 112 02/21/2015   LDLDIRECT 131* 08/22/2011   TRIG 155* 02/21/2015   CHOLHDL 3.6 02/21/2015   Lab Results  Component Value Date   ALT 14 04/17/2015   AST 16 04/17/2015   ALKPHOS 99 04/17/2015   BILITOT 0.64 04/17/2015   Pt takes simvastatin 20mg  QD.  Prior pt of Dr Leward Quan who is retiring and pt is looking for a new PCP.   Under care Dr. Jana Hakim for breast cancer in 2012 and underwent a partial mastectomy.  No evidence of recurrence 4 years out. Plan for one additional year of anti estrogen therapy.   rosacea  Pt states she normally gets the redness around her upper lip. Pt states it flares up when she is getting aggravated once a month. Pt states it feels like a sunburn. Pt states she uses Vasolin on it and it works well. Recent prescription for metrogel from oncology, pt did not fill prescription because the medication cost too much.   Pt also seen by Dr Denna Haggard; Dermatologist.   prediabetes Lab Results  Component Value Date  HGBA1C 6.2* 02/21/2015   Advised pt to watch diet and weight, will recheck levels today.   Patient Active Problem List   Diagnosis Date Noted  . Breast microcalcifications 12/27/2011  . History of breast cancer 12/27/2011  . Hypothyroidism 08/25/2011  . Hypercholesteremia 08/25/2011  . HTN (hypertension) 08/25/2011  . Cancer of central portion of female breast ([place]) 02/27/2011  . Breast cancer, stage 1 ([place]) 02/06/2011  . Breast mass, right 11/28/2010   Past Medical History  Diagnosis Date  . Hypertension     NO CARDIAC MD,  [facility]  PCP  ,  [facility]  . Cataract   . Leg laceration     WITH SURGERY  . Breast cancer ([place]) 11/2010    Rt breast  . S/P radiation therapy 03/14/11 - 04/05/11    Right Breast / 4256 cGy in 16 Fractions  . Thyroid disease   .  Hypercholesteremia   . Arthritis    Past Surgical History  Procedure Laterality Date  . Tubal ligation  1972  . Eye surgery       BILATERAL CATARACT SURGERY  . Breast surgery  11/28/10    SKIN-PUNCH BIOSPY RIGHT BREAST(NIPPLE), ER+, RP+, LOW S-PHASE, HER 2NEU NEGAITIVE  . Breast lumpectomy  01/22/11    RIGHT BREAST LUMPECTOMY WITH BIOSPY OF 1 SENTINEL NODE, INVASIVE GRADE II DUCTAL CARCINOMA , INTERMEDIATE GRADE DUCTAL CARCINOMA IN SITU , DERMAL SKIN INVOLVED, MARGINS NEGATIVE,( 0/1)  NODE POSITIVE, ER+, PR+, LOW s-PHASE, HER 2 NEU- NO AMPLIFICATION  . Partial mastectomy with needle localization  01/10/2012    Procedure: PARTIAL MASTECTOMY WITH NEEDLE LOCALIZATION;  Surgeon: Marcello Moores A. Cornett, MD;  Location: [place];  Service: General;  Laterality: Right;  right breast needle localized partial mastectomy   No Known Allergies Prior to Admission medications   Medication Sig Start Date End Date Taking? Authorizing Provider  anastrozole (ARIMIDEX) 1 MG tablet Take 1 tablet (1 mg total) by mouth daily. 04/17/15  Yes Chauncey Cruel, MD  aspirin EC 81 MG tablet Take 81 mg by mouth daily.     Yes Historical Provider, MD  atenolol-chlorthalidone (TENORETIC) 50-25 MG tablet Take 1 tablet by mouth daily. 02/07/15  Yes Leandrew Koyanagi, MD  cholecalciferol (VITAMIN D) 1000 units tablet Take 1,000 Units by mouth daily.   Yes Historical Provider, MD  simvastatin (ZOCOR) 20 MG tablet One daily for cholesterol 02/07/15  Yes Leandrew Koyanagi, MD  ipratropium (ATROVENT) 0.03 % nasal spray Place 2 sprays into both nostrils 2 (two) times daily. As needed Patient not taking: Reported on 06/22/2015 02/21/15   Ezekiel Slocumb, PA-C  levothyroxine (SYNTHROID, LEVOTHROID) 50 MCG tablet Take 1 tablet (50 mcg total) by mouth daily. Patient not taking: Reported on 06/22/2015 03/06/15   Ezekiel Slocumb, PA-C  metroNIDAZOLE (METROGEL) 1 % gel Apply topically daily. Patient not taking: Reported on  06/22/2015 04/17/15   Chauncey Cruel, MD   Social History   Social History  . Marital Status: Married    Spouse Name: N/A  . Number of Children: 3  . Years of Education: 12   Occupational History  . Waitress     [employer]   Social History Main Topics  . Smoking status: Never Smoker   . Smokeless tobacco: Never Used  . Alcohol Use: No     Comment: Rarely/On Special Occasions  . Drug Use: No  . Sexual Activity: No     Comment: G3, P3, MENARCHE, AGE  14, MENOPAUSE AGE 15, NO BC AND HRT X 10 YEARS   Other Topics Concern  . Not on file   Social History Narrative   Married for 56 years      3 children, one deceased   40 grand-children, 3 great-grandchildren      Review of Systems  Constitutional: Negative for fatigue and unexpected weight change.  Respiratory: Negative for chest tightness and shortness of breath.   Cardiovascular: Negative for chest pain, palpitations and leg swelling.  Gastrointestinal: Negative for abdominal pain and blood in stool.  Musculoskeletal:       Pt is positive for left hip pain radiating down to her ankle.  Neurological: Negative for dizziness, syncope, light-headedness and headaches.       Objective:   Physical Exam  Constitutional: She is oriented to person, place, and time. She appears well-developed and well-nourished.  HENT:  Head: Normocephalic and atraumatic.  Eyes: Conjunctivae and EOM are normal. Pupils are equal, round, and reactive to light.  Neck: Carotid bruit is not present.  Cardiovascular: Normal rate, regular rhythm, normal heart sounds and intact distal pulses.   Pulmonary/Chest: Effort normal and breath sounds normal.  Abdominal: Soft. She exhibits no pulsatile midline mass. There is no tenderness.  Musculoskeletal: She exhibits no edema.  Sciatic notch non-tender. Lumbar spine non-tender. Tender over left troch bursa with pain into anterior hip. Limited internal rotation of left hip with reproduction  of pain. Negative straight leg raise.   Neurological: She is alert and oriented to person, place, and time.  Pt has negative straight leg raise.   Skin: Skin is warm and dry.  Faint erythema over upper lip and over nares.  No papules, no vesicles.  Psychiatric: She has a normal mood and affect. Her behavior is normal.  Vitals reviewed.   Filed Vitals:   06/22/15 0951 06/22/15 1011  BP: 160/92 164/88  Pulse: 70   Temp: 98 F (36.7 C)   TempSrc: Oral   Height: 5\' 4"  (1.626 m)   Weight: 160 lb (72.576 kg)   SpO2: 97%    Results for orders placed or performed in visit on 06/22/15  POCT glycosylated hemoglobin (Hb A1C)  Result Value Ref Range   Hemoglobin A1C 6.1    Dg Hip Unilat W Or W/o Pelvis 2-3 Views Left  06/22/2015  CLINICAL DATA:  Left hip pain. Radiation down leg. Initial evaluation . EXAM: DG HIP (WITH OR WITHOUT PELVIS) 2-3V LEFT COMPARISON:  No recent. FINDINGS: Degenerative changes lumbar spine and both hips. Severe degenerative changes are present about the left hip. No evidence of fracture dislocation . Pelvic calcifications consistent phleboliths. IMPRESSION: Degenerative changes lumbar spine and both hips, particular severe about the left hip. No acute bony or joint abnormality identified. Electronically Signed   By: Marcello Moores  Register   On: 06/22/2015 12:16        Assessment & Plan:   DALLAS PUPILLO is a 81 y.o. female Left hip pain - Plan: DG HIP UNILAT W OR W/O PELVIS 2-3 VIEWS LEFT Osteoarthritis of left hip, unspecified osteoarthritis type - Plan: AMB referral to orthopedics  - Severe left hip osteoarthritis likely cause of pain. Tylenol and range of motion discussed, refer to orthopedics for treatment options. Call if stronger medication needed.  Essential hypertension  -Elevated in office, but we'll continue same medications for now. Check home readings, and if remaining over 140/90, RTC to discuss options.  Hypothyroidism, unspecified hypothyroidism type -  Plan: TSH, levothyroxine (SYNTHROID, LEVOTHROID)  50 MCG tablet  - Check TSH, continue Synthroid 50 MCG daily.  Rosacea - Plan: metroNIDAZOLE (METROGEL) 0.75 % gel  -Will try different concentration of metronidazole gel, twice a day dosing, to see if this is more cost effective. Trigger avoidance discussed and handout given.  Hypercholesteremia - Plan: simvastatin (ZOCOR) 20 MG tablet  -Continue Zocor 20 mg daily.  Prediabetes - Plan: POCT glycosylated hemoglobin (Hb A1C)  -Continue to watch diet, exercise, as tolerated by her hip pain as above. Recheck levels in 3-6 months.  Meds ordered this encounter  Medications  . cholecalciferol (VITAMIN D) 1000 units tablet    Sig: Take 1,000 Units by mouth daily.  . metroNIDAZOLE (METROGEL) 0.75 % gel    Sig: Apply 1 application topically 2 (two) times daily.    Dispense:  45 g    Refill:  3  . simvastatin (ZOCOR) 20 MG tablet    Sig: One daily for cholesterol    Dispense:  90 tablet    Refill:  1  . levothyroxine (SYNTHROID, LEVOTHROID) 50 MCG tablet    Sig: Take 1 tablet (50 mcg total) by mouth daily.    Dispense:  30 tablet    Refill:  5  . atenolol-chlorthalidone (TENORETIC) 50-25 MG tablet    Sig: Take 1 tablet by mouth daily.    Dispense:  90 tablet    Refill:  1   Patient Instructions       IF you received an x-ray today, you will receive an invoice from [facility]. Please contact [facility] at [phone] with questions or concerns regarding your invoice.   IF you received labwork today, you will receive an invoice from [employer]. Please contact [facility] at [phone] with questions or concerns regarding your invoice.   Our billing staff will not be able to assist you with questions regarding bills from these companies.  You will be contacted with the lab results as soon as they are available. The fastest way to get your results is to activate your My Chart account.  Instructions are located on the last page of this paperwork. If you have not heard from [place] regarding the results in 2 weeks, please contact this office.     Same doses of blood pressure medicine for now - Keep a record of your blood pressures outside of the office and if remaining over 140-150/90, return for recheck and discussion of other doses.   Try the other metronidazole gel twice per day for rosacea, and see information below. If persists - follow up with Dr. Denna Haggard.  You do have significant arthritis of your left hip that is likely causing your pain. I will refer you to orthopedics to decide on treatment options. We'll also have some arthritis in the back, and this may cause radiating pain down the leg. If the radiating pain persists, we can look at other medication options such as prednisone. For now continue Tylenol over-the-counter.   Return to the clinic or go to the nearest emergency room if any of your symptoms worsen or new symptoms occur.   Rosacea Rosacea is a long-term (chronic) condition that affects the skin of the face, including the cheeks, nose, brow, and chin. This condition can also affect the eyes. Rosacea causes blood vessels near the surface of the skin to enlarge, which results in redness. CAUSES The cause of this condition is not known. Certain triggers can make rosacea worse, including:  Hot baths.  Exercise.  Sunlight.  Very  hot or cold temperatures.  Hot or spicy foods and drinks.  Drinking alcohol.  Stress.  Taking blood pressure medicine.  Long-term use of topical steroids on the face. RISK FACTORS This condition is more likely to develop in:  People who are older than 79 years of age.  Women.  People who have light-colored skin (light complexion).  People who have a family history of rosacea. SYMPTOMS  Symptoms of this condition include:  Redness of the face.  Red bumps or pimples on the face.  A red, enlarged nose.  Blushing  easily.  Red lines on the skin.  Irritated or burning feeling in the eyes.  Swollen eyelids. DIAGNOSIS This condition is diagnosed with a medical history and physical exam. TREATMENT There is no cure for this condition, but treatment can help to control your symptoms. Your health care provider may recommend that you see a skin specialist (dermatologist). Treatment may include:  Antibiotic medicines that are applied to the skin or taken as a pill.  Laser treatment to improve the appearance of the skin.  Surgery. This is rare. Your health care provider will also recommend the best way to take care of your skin. Even after your skin improves, you will likely need to continue treatment to prevent your rosacea from coming back. HOME CARE INSTRUCTIONS Skin Care Take care of your skin as told by your health care provider. You may be told to do these things:  Wash your skin gently two or more times each day.  Use mild soap.  Use a sunscreen or sunblock with SPF 30 or greater.  Use gentle cosmetics that are meant for sensitive skin.  Shave with an electric shaver instead of a blade. Lifestyle  Try to keep track of what foods trigger this condition. Avoid any triggers. These may include:  Spicy foods.  Seafood.  Cheese.  Hot liquids.  Nuts.  Chocolate.  Iodized salt.  Do not drink alcohol.  Avoid extremely cold or hot temperatures.  Try to reduce your stress. If you need help, talk with your health care provider.  When you exercise, do these things to stay cool:  Limit your sun exposure.  Use a fan.  Do shorter and more frequent intervals of exercise. General Instructions   Keep all follow-up visits as told by your health care provider. This is important.  Take over-the-counter and prescription medicines only as told by your health care provider.  If your eyelids are affected, apply warm compresses to them. Do this as told by your health care  provider.  If you were prescribed an antibiotic medicine, apply or take it as told by your health care provider. Do not stop using the antibiotic even if your condition improves. SEEK MEDICAL CARE IF:  Your symptoms get worse.  Your symptoms do not improve after two months of treatment.  You have new symptoms.  You have any changes in vision or you have problems with your eyes, such as redness or itching.  You feel depressed.  You lose your appetite.  You have trouble concentrating.   This information is not intended to replace advice given to you by your health care provider. Make sure you discuss any questions you have with your health care provider.   Document Released: 03/21/2004 Document Revised: 11/02/2014 Document Reviewed: 04/20/2014 Elsevier Interactive Patient Education [employer].      I personally performed the services described in this documentation, which was scribed in my presence. The recorded information has been  reviewed and considered, and addended by me as needed.

## 2015-06-22 NOTE — Patient Instructions (Addendum)
IF you received an x-ray today, you will receive an invoice from [facility]. Please contact [facility] at [phone] with questions or concerns regarding your invoice.   IF you received labwork today, you will receive an invoice from [employer]. Please contact [facility] at [phone] with questions or concerns regarding your invoice.   Our billing staff will not be able to assist you with questions regarding bills from these companies.  You will be contacted with the lab results as soon as they are available. The fastest way to get your results is to activate your My Chart account. Instructions are located on the last page of this paperwork. If you have not heard from [place] regarding the results in 2 weeks, please contact this office.     Same doses of blood pressure medicine for now - Keep a record of your blood pressures outside of the office and if remaining over 140-150/90, return for recheck and discussion of other doses.   Try the other metronidazole gel twice per day for rosacea, and see information below. If persists - follow up with Dr. Denna Haggard.  You do have significant arthritis of your left hip that is likely causing your pain. I will refer you to orthopedics to decide on treatment options. We'll also have some arthritis in the back, and this may cause radiating pain down the leg. If the radiating pain persists, we can look at other medication options such as prednisone. For now continue Tylenol over-the-counter.   Return to the clinic or go to the nearest emergency room if any of your symptoms worsen or new symptoms occur.   Rosacea Rosacea is a long-term (chronic) condition that affects the skin of the face, including the cheeks, nose, brow, and chin. This condition can also affect the eyes. Rosacea causes blood vessels near the surface of the skin to enlarge, which results in redness. CAUSES The cause of this condition is not  known. Certain triggers can make rosacea worse, including:  Hot baths.  Exercise.  Sunlight.  Very hot or cold temperatures.  Hot or spicy foods and drinks.  Drinking alcohol.  Stress.  Taking blood pressure medicine.  Long-term use of topical steroids on the face. RISK FACTORS This condition is more likely to develop in:  People who are older than 79 years of age.  Women.  People who have light-colored skin (light complexion).  People who have a family history of rosacea. SYMPTOMS  Symptoms of this condition include:  Redness of the face.  Red bumps or pimples on the face.  A red, enlarged nose.  Blushing easily.  Red lines on the skin.  Irritated or burning feeling in the eyes.  Swollen eyelids. DIAGNOSIS This condition is diagnosed with a medical history and physical exam. TREATMENT There is no cure for this condition, but treatment can help to control your symptoms. Your health care provider may recommend that you see a skin specialist (dermatologist). Treatment may include:  Antibiotic medicines that are applied to the skin or taken as a pill.  Laser treatment to improve the appearance of the skin.  Surgery. This is rare. Your health care provider will also recommend the best way to take care of your skin. Even after your skin improves, you will likely need to continue treatment to prevent your rosacea from coming back. HOME CARE INSTRUCTIONS Skin Care Take care of your skin as told by your health care provider. You may be told to do these  things:  Wash your skin gently two or more times each day.  Use mild soap.  Use a sunscreen or sunblock with SPF 30 or greater.  Use gentle cosmetics that are meant for sensitive skin.  Shave with an electric shaver instead of a blade. Lifestyle  Try to keep track of what foods trigger this condition. Avoid any triggers. These may include:  Spicy foods.  Seafood.  Cheese.  Hot  liquids.  Nuts.  Chocolate.  Iodized salt.  Do not drink alcohol.  Avoid extremely cold or hot temperatures.  Try to reduce your stress. If you need help, talk with your health care provider.  When you exercise, do these things to stay cool:  Limit your sun exposure.  Use a fan.  Do shorter and more frequent intervals of exercise. General Instructions   Keep all follow-up visits as told by your health care provider. This is important.  Take over-the-counter and prescription medicines only as told by your health care provider.  If your eyelids are affected, apply warm compresses to them. Do this as told by your health care provider.  If you were prescribed an antibiotic medicine, apply or take it as told by your health care provider. Do not stop using the antibiotic even if your condition improves. SEEK MEDICAL CARE IF:  Your symptoms get worse.  Your symptoms do not improve after two months of treatment.  You have new symptoms.  You have any changes in vision or you have problems with your eyes, such as redness or itching.  You feel depressed.  You lose your appetite.  You have trouble concentrating.   This information is not intended to replace advice given to you by your health care provider. Make sure you discuss any questions you have with your health care provider.   Document Released: 03/21/2004 Document Revised: 11/02/2014 Document Reviewed: 04/20/2014 Elsevier Interactive Patient Education [employer].

## 2015-11-04 ENCOUNTER — Other Ambulatory Visit: Payer: Self-pay | Admitting: Family Medicine

## 2015-12-07 ENCOUNTER — Other Ambulatory Visit: Payer: Self-pay | Admitting: Oncology

## 2015-12-07 DIAGNOSIS — Z853 Personal history of malignant neoplasm of breast: Secondary | ICD-10-CM

## 2015-12-13 ENCOUNTER — Other Ambulatory Visit: Payer: Self-pay | Admitting: Family Medicine

## 2015-12-13 DIAGNOSIS — E7800 Pure hypercholesterolemia, unspecified: Secondary | ICD-10-CM

## 2015-12-25 ENCOUNTER — Other Ambulatory Visit: Payer: Self-pay | Admitting: Family Medicine

## 2015-12-25 DIAGNOSIS — E7800 Pure hypercholesterolemia, unspecified: Secondary | ICD-10-CM

## 2016-02-29 ENCOUNTER — Ambulatory Visit (INDEPENDENT_AMBULATORY_CARE_PROVIDER_SITE_OTHER): Payer: Medicare Other | Admitting: Family Medicine

## 2016-02-29 ENCOUNTER — Encounter: Payer: Self-pay | Admitting: Family Medicine

## 2016-02-29 VITALS — BP 143/77 | HR 53 | Temp 97.7°F | Resp 16 | Ht 64.0 in | Wt 168.0 lb

## 2016-02-29 DIAGNOSIS — E7800 Pure hypercholesterolemia, unspecified: Secondary | ICD-10-CM

## 2016-02-29 DIAGNOSIS — Z0000 Encounter for general adult medical examination without abnormal findings: Secondary | ICD-10-CM

## 2016-02-29 DIAGNOSIS — J300 Vasomotor rhinitis: Secondary | ICD-10-CM

## 2016-02-29 DIAGNOSIS — M25552 Pain in left hip: Secondary | ICD-10-CM | POA: Diagnosis not present

## 2016-02-29 DIAGNOSIS — R7303 Prediabetes: Secondary | ICD-10-CM

## 2016-02-29 DIAGNOSIS — Z23 Encounter for immunization: Secondary | ICD-10-CM

## 2016-02-29 DIAGNOSIS — E039 Hypothyroidism, unspecified: Secondary | ICD-10-CM

## 2016-02-29 DIAGNOSIS — M1612 Unilateral primary osteoarthritis, left hip: Secondary | ICD-10-CM | POA: Diagnosis not present

## 2016-02-29 DIAGNOSIS — I10 Essential (primary) hypertension: Secondary | ICD-10-CM | POA: Diagnosis not present

## 2016-02-29 DIAGNOSIS — L719 Rosacea, unspecified: Secondary | ICD-10-CM | POA: Diagnosis not present

## 2016-02-29 MED ORDER — IPRATROPIUM BROMIDE 0.03 % NA SOLN: 2.0000 | Freq: Two times a day (BID) | NASAL | 5 refills | Status: DC

## 2016-02-29 MED ORDER — LEVOTHYROXINE SODIUM 50 MCG PO TABS: 50.0000 ug | ORAL_TABLET | Freq: Every day | ORAL | 3 refills | Status: DC

## 2016-02-29 MED ORDER — METRONIDAZOLE 0.75 % EX GEL: 1.0000 "application " | Freq: Two times a day (BID) | CUTANEOUS | 3 refills | Status: DC

## 2016-02-29 MED ORDER — ATENOLOL-CHLORTHALIDONE 50-25 MG PO TABS: 1.0000 | ORAL_TABLET | Freq: Every day | ORAL | 3 refills | Status: DC

## 2016-02-29 MED ORDER — SIMVASTATIN 20 MG PO TABS: 20.0000 mg | ORAL_TABLET | Freq: Every day | ORAL | 3 refills | Status: DC

## 2016-02-29 NOTE — Patient Instructions (Addendum)
I will refer you to orthopaedics to discuss treatment options for the severe arthritis in your hip. Tylenol is the safest for now, but let me know if you need something stronger.  2nd pneumonia vaccine given today.   Medications were refilled at same doses today.   Return to the clinic or go to the nearest emergency room if any of your symptoms worsen or new symptoms occur.  Keeping You Healthy  Get These Tests  Blood Pressure- Have your blood pressure checked by your healthcare provider at least once a year.  Normal blood pressure is 120/80.  Weight- Have your body mass index (BMI) calculated to screen for obesity.  BMI is a measure of body fat based on height and weight.  You can calculate your own BMI at [employer].it  Cholesterol- Have your cholesterol checked every year.  Diabetes- Have your blood sugar checked every year if you have high blood pressure, high cholesterol, a family history of diabetes or if you are overweight.  Pap Test - Have a pap test every 1 to 5 years if you have been sexually active.  If you are older than 65 and recent pap tests have been normal you may not need additional pap tests.  In addition, if you have had a hysterectomy  for benign disease additional pap tests are not necessary.  Mammogram-Yearly mammograms are essential for early detection of breast cancer  Screening for Colon Cancer- Colonoscopy starting at age 88. Screening may begin sooner depending on your family history and other health conditions.  Follow up colonoscopy as directed by your Gastroenterologist.  Screening for Osteoporosis- Screening begins at age 54 with bone density scanning, sooner if you are at higher risk for developing Osteoporosis.  Get these medicines  Calcium with Vitamin D- Your body requires 1200-1500 mg of Calcium a day and [phone] IU of Vitamin D a day.  You can only absorb 500 mg of Calcium at a time therefore Calcium must be taken in 2 or 3 separate  doses throughout the day.  Hormones- Hormone therapy has been associated with increased risk for certain cancers and heart disease.  Talk to your healthcare provider about if you need relief from menopausal symptoms.  Aspirin- Ask your healthcare provider about taking Aspirin to prevent Heart Disease and Stroke.  Get these Immuniztions  Flu shot- Every fall  Pneumonia shot- Once after the age of 48; if you are younger ask your healthcare provider if you need a pneumonia shot.  Tetanus- Every ten years.  Zostavax- Once after the age of 108 to prevent shingles.  Take these steps  Don't smoke- Your healthcare provider can help you quit. For tips on how to quit, ask your healthcare provider or go to www.smokefree.gov or call 1-800 QUIT-NOW.  Be physically active- Exercise 5 days a week for a minimum of 30 minutes.  If you are not already physically active, start slow and gradually work up to 30 minutes of moderate physical activity.  Try walking, dancing, bike riding, swimming, etc.  Eat a healthy diet- Eat a variety of healthy foods such as fruits, vegetables, whole grains, low fat milk, low fat cheeses, yogurt, lean meats, chicken, fish, eggs, dried beans, tofu, etc.  For more information go to www.thenutritionsource.org  Dental visit- Brush and floss teeth twice daily; visit your dentist twice a year.  Eye exam- Visit your Optometrist or Ophthalmologist yearly.  Drink alcohol in moderation- Limit alcohol intake to one drink or less a day.  Never drink  and drive.  Depression- Your emotional health is as important as your physical health.  If you're feeling down or losing interest in things you normally enjoy, please talk to your healthcare provider.  Seat Belts- can save your life; always wear one  Smoke/Carbon Monoxide detectors- These detectors need to be installed on the appropriate level of your home.  Replace batteries at least once a year.  Violence- If anyone is threatening or  hurting you, please tell your healthcare provider.  Living Will/ Health care power of attorney- Discuss with your healthcare provider and family.    IF you received an x-ray today, you will receive an invoice from [facility]. Please contact [facility]iology at [phone] with questions or concerns regarding your invoice.   IF you received labwork today, you will receive an invoice from [place]. Please contact [facility] at [phone] with questions or concerns regarding your invoice.   Our billing staff will not be able to assist you with questions regarding bills from these companies.  You will be contacted with the lab results as soon as they are available. The fastest way to get your results is to activate your My Chart account. Instructions are located on the last page of this paperwork. If you have not heard from [place] regarding the results in 2 weeks, please contact this office.

## 2016-02-29 NOTE — Progress Notes (Signed)
By signing my name below I, Tereasa Coop, attest that this documentation has been prepared under the direction and in the presence of Wendie Agreste, MD. Electonically Signed. Tereasa Coop, Scribe 02/29/2016 at 3:45 PM  Subjective:    Patient ID: Molly Lambert, female    DOB: March 29, 1934, 81 y.o.   MRN: [id number]  Chief Complaint  Patient presents with  . Annual Exam  . Medication Refill    would like to have an rx for atrovent nasal spray    HPI Molly Lambert is a 82 y.o. female who presents to the [facility] for her annual physical. Last seen by Dr Carlota Raspberry in April 2017.  Pt is a previous pt of [place], MD. Note that pt's last CPE was in December of 2016 and was performed by Bennett Scrape.  Hip Pain Pt reports chronic left hip pain that was evaluated in April 2017. Pain at that time had been there for the past year. Pt had been taking tylenol OTC at that time. Xray indicated significant arthritis, severe on left. Plan at that time was to follow up with orthopedic specialist. Pt today states that she "chickened out" and did not follow up as instructed. Pt states pain has been constant since last evaluated.   History of breast cancer Followed by Dr Jana Hakim for breast cancer that was dx in 2012. Last seen on 04/17/15. Breast cancer was initially treated with rt breast mastectomy with sentinel node biopsy, ER positive. Radiation therapy in 2013. Then pt started on Arimidex. Planned for 5 years of anti-estrogen therapy.   Allergic rhinitis Pt has used Atrovent nasal spray in the past. States the nasal spray has been working well.   Rosacea   Pt states that her rosacea rash appears intermittently. Pt does not use her Metrogel daily, and only uses it on rash when rash appears. Rash resolves with Metrogel application. Pt is followed by Dr Denna Haggard, dermatologist, who prescribed the metrogel.   Pre-DM Lab Results  Component Value Date   HGBA1C 6.1 06/22/2015    Wt Readings from Last 3 Encounters:  02/29/16 168 lb (76.2 kg)  06/22/15 160 lb (72.6 kg)  04/17/15 167 lb 4.8 oz (75.9 kg)   HTN Lab Results  Component Value Date   CREATININE 1.0 04/17/2015  Pt takes Tenoretic 50/25mg  QD. Denies any CP, SOB, palpitations, dizziness, lightheadedness, or HAs. Pt states that she does not take her baby aspirin everyday, but she usually takes it.  Hypothyroidism Lab Results  Component Value Date   TSH 1.43 06/22/2015  Takes 37mcg synthroid QD. Denies any unexpected weight changes.   HLD Pt takes Zocor 20mg  QD Lab Results  Component Value Date   CHOL 198 02/21/2015   HDL 55 02/21/2015   LDLCALC 112 02/21/2015   LDLDIRECT 131 (H) 08/22/2011   TRIG 155 (H) 02/21/2015   CHOLHDL 3.6 02/21/2015   Lab Results  Component Value Date   ALT 14 04/17/2015   AST 16 04/17/2015   ALKPHOS 99 04/17/2015   BILITOT 0.64 04/17/2015  Denies any side effects from medication.   Depression screening Depression screen [facility] 2/9 02/29/2016 06/22/2015 02/21/2015 02/07/2015 01/28/2014  Decreased Interest 0 0 0 0 0  Down, Depressed, Hopeless 0 0 0 0 0  PHQ - 2 Score 0 0 0 0 0   Fall screening No falls in the past year  Functional Status Survey: Is the patient deaf or have difficulty hearing?: No Does the patient have difficulty  seeing, even when wearing glasses/contacts?: No Does the patient have difficulty concentrating, remembering, or making decisions?: No Does the patient have difficulty walking or climbing stairs?: No Does the patient have difficulty dressing or bathing?: No Does the patient have difficulty doing errands alone such as visiting a doctor's office or shopping?: No   Vision screening  Visual Acuity Screening   Right eye Left eye Both eyes  Without correction: 20/20-1 20/20 20/20  With correction:     Pt has not seen an eye doctor within the past year.   Exercise Pt denies exercising much at home.   Dentist  Pt has not seen a dentist  within the past year.  Advanced directives Pt states she has a living will and plans to bring a copy to [facility].  Immunizations  Immunization History  Administered Date(s) Administered  . Pneumococcal Polysaccharide-23 08/23/2010  . Tdap 07/20/2009  Pt was offered and declined flu shot today. Pt willing to have second pneumonia vaccine.    Note that pt last ate at 0700 hrs this morning.   Patient Active Problem List   Diagnosis Date Noted  . Breast microcalcifications 12/27/2011  . History of breast cancer 12/27/2011  . Hypothyroidism 08/25/2011  . Hypercholesteremia 08/25/2011  . HTN (hypertension) 08/25/2011  . Cancer of central portion of female breast ([place]) 02/27/2011  . Breast cancer, stage 1 ([place]) 02/06/2011  . Breast mass, right 11/28/2010   Past Medical History:  Diagnosis Date  . Arthritis   . Breast cancer ([place]) 11/2010   Rt breast  . Cataract   . Hypercholesteremia   . Hypertension    NO CARDIAC MD,  [facility]  PCP  ,  URGENT MED  . Leg laceration    WITH SURGERY  . S/P radiation therapy 03/14/11 - 04/05/11   Right Breast / 4256 cGy in 16 Fractions  . Thyroid disease    Past Surgical History:  Procedure Laterality Date  . BREAST LUMPECTOMY  01/22/11   RIGHT BREAST LUMPECTOMY WITH BIOSPY OF 1 SENTINEL NODE, INVASIVE GRADE II DUCTAL CARCINOMA , INTERMEDIATE GRADE DUCTAL CARCINOMA IN SITU , DERMAL SKIN INVOLVED, MARGINS NEGATIVE,( 0/1)  NODE POSITIVE, ER+, PR+, LOW s-PHASE, HER 2 NEU- NO AMPLIFICATION  . BREAST SURGERY  11/28/10   SKIN-PUNCH BIOSPY RIGHT BREAST(NIPPLE), ER+, RP+, LOW S-PHASE, HER 2NEU NEGAITIVE  . EYE SURGERY      BILATERAL CATARACT SURGERY  . PARTIAL MASTECTOMY WITH NEEDLE LOCALIZATION  01/10/2012   Procedure: PARTIAL MASTECTOMY WITH NEEDLE LOCALIZATION;  Surgeon: Marcello Moores A. Cornett, MD;  Location: [facility];  Service: General;  Laterality: Right;  right breast needle localized partial mastectomy  . TUBAL LIGATION  1972   No  Known Allergies Prior to Admission medications   Medication Sig Start Date End Date Taking? Authorizing Provider  anastrozole (ARIMIDEX) 1 MG tablet Take 1 tablet (1 mg total) by mouth daily. 04/17/15   Chauncey Cruel, MD  aspirin EC 81 MG tablet Take 81 mg by mouth daily.      Historical Provider, MD  atenolol-chlorthalidone (TENORETIC) 50-25 MG tablet take 1 tablet by mouth once daily 11/06/15   Wendie Agreste, MD  cholecalciferol (VITAMIN D) 1000 units tablet Take 1,000 Units by mouth daily.    Historical Provider, MD  ipratropium (ATROVENT) 0.03 % nasal spray Place 2 sprays into both nostrils 2 (two) times daily. As needed 02/21/15   Ezekiel Slocumb, PA-C  levothyroxine (SYNTHROID, LEVOTHROID) 50 MCG tablet Take 1 tablet (50 mcg total)  by mouth daily. 06/22/15   Wendie Agreste, MD  metroNIDAZOLE (METROGEL) 0.75 % gel Apply 1 application topically 2 (two) times daily. 06/22/15   Wendie Agreste, MD  simvastatin (ZOCOR) 20 MG tablet take 1 tablet by mouth once daily 12/17/15   Jaynee Eagles, PA-C  simvastatin (ZOCOR) 20 MG tablet take 1 tablet by mouth once daily 12/27/15   Wendie Agreste, MD   Social History   Social History  . Marital status: Married    Spouse name: N/A  . Number of children: 3  . Years of education: 12   Occupational History  . Waitress     [employer]   Social History Main Topics  . Smoking status: Never Smoker  . Smokeless tobacco: Never Used  . Alcohol use No     Comment: Rarely/On Special Occasions  . Drug use: No  . Sexual activity: No     Comment: G3, P3, MENARCHE, AGE 6, MENOPAUSE AGE 64, NO BC AND HRT X 10 YEARS   Other Topics Concern  . Not on file   Social History Narrative   Married for 56 years      3 children, one deceased   45 grand-children, 3 great-grandchildren      Review of Systems 13 point ROS reviewed and negative except for rash and left hip pain as in HPI.    Objective:   Physical Exam  Constitutional: She is  oriented to person, place, and time. She appears well-developed and well-nourished.  HENT:  Head: Normocephalic and atraumatic.  Right Ear: External ear normal.  Left Ear: External ear normal.  Mouth/Throat: Oropharynx is clear and moist.  Eyes: Conjunctivae are normal. Pupils are equal, round, and reactive to light.  Neck: Normal range of motion. Neck supple. No thyromegaly present.  Cardiovascular: Normal rate, regular rhythm, normal heart sounds and intact distal pulses.   No murmur heard. Pulmonary/Chest: Effort normal and breath sounds normal. No respiratory distress. She has no wheezes.  Abdominal: Soft. Bowel sounds are normal. There is no tenderness.  Musculoskeletal: Normal range of motion. She exhibits no edema or tenderness.  Minimal internal rotation of left hip with pain in groin.  Lymphadenopathy:    She has no cervical adenopathy.  Neurological: She is alert and oriented to person, place, and time.  Skin: Skin is warm and dry. No rash noted.  Psychiatric: She has a normal mood and affect. Her behavior is normal. Thought content normal.     Vitals:   02/29/16 1503  BP: (!) 143/77  Pulse: (!) 53  Resp: 16  Temp: 97.7 F (36.5 C)  TempSrc: Oral  SpO2: 96%  Weight: 168 lb (76.2 kg)  Height: 5\' 4"  (1.626 m)       Assessment & Plan:   SHAWANA TIRABASSI is a 82 y.o. female Medicare annual wellness visit, subsequent  -  - anticipatory guidance as below in AVS, screening labs if needed. Health maintenance items as above in HPI discussed/recommended as applicable.   - no concerning responses on depression, fall, or functional status screening. Any positive responses noted as above. Advanced directives discussed as in CHL.   Rosacea - Plan: metroNIDAZOLE (METROGEL) 0.75 % gel  -metrogel refilled for prn use.   Hypercholesteremia - Plan: simvastatin (ZOCOR) 20 MG tablet, Comprehensive metabolic panel, Lipid panel  - tolerating zocor, cont same dose, lipid panel and CMP  pending.   Osteoarthritis of left hip, unspecified osteoarthritis type - Plan: AMB referral to  orthopedics, CANCELED: Ambulatory referral to Orthopedic Surgery Left hip pain - Plan: AMB referral to orthopedics, CANCELED: Ambulatory referral to Orthopedic Surgery  - severe OA.  Now ready to meet with ortho to discuss options. Repeat referral placed. Tylenol as needed, call if intermittent stronger med needed.   Hypothyroidism, unspecified type - Plan: levothyroxine (SYNTHROID, LEVOTHROID) 50 MCG tablet, TSH  - check tsh, cont same dose synthroid for now.   Vasomotor rhinitis, unspecified chronicity - Plan: ipratropium (ATROVENT) 0.03 % nasal spray  - episodic use atrovent ns - refilled.   Essential hypertension - Plan: atenolol-chlorthalidone (TENORETIC) 50-25 MG tablet  - borderline. Continue same dose of tenoretic at this point. Labs pending.   Prediabetes - Plan: Hemoglobin A1C  - repeat A1c.  Watch diet as exercise may be limited by her hip pain.   Need for prophylactic vaccination against Streptococcus pneumoniae (pneumococcus) - Plan: Pneumococcal conjugate vaccine 13-valent IM  - prevnar given.   Meds ordered this encounter  Medications  . metroNIDAZOLE (METROGEL) 0.75 % gel    Sig: Apply 1 application topically 2 (two) times daily.    Dispense:  45 g    Refill:  3  . simvastatin (ZOCOR) 20 MG tablet    Sig: Take 1 tablet (20 mg total) by mouth daily.    Dispense:  90 tablet    Refill:  3  . levothyroxine (SYNTHROID, LEVOTHROID) 50 MCG tablet    Sig: Take 1 tablet (50 mcg total) by mouth daily before breakfast.    Dispense:  90 tablet    Refill:  3  . ipratropium (ATROVENT) 0.03 % nasal spray    Sig: Place 2 sprays into both nostrils 2 (two) times daily. As needed    Dispense:  30 mL    Refill:  5  . atenolol-chlorthalidone (TENORETIC) 50-25 MG tablet    Sig: Take 1 tablet by mouth daily.    Dispense:  90 tablet    Refill:  3   Patient Instructions   I will refer  you to orthopaedics to discuss treatment options for the severe arthritis in your hip. Tylenol is the safest for now, but let me know if you need something stronger.  2nd pneumonia vaccine given today.   Medications were refilled at same doses today.   Return to the clinic or go to the nearest emergency room if any of your symptoms worsen or new symptoms occur.  Keeping You Healthy  Get These Tests  Blood Pressure- Have your blood pressure checked by your healthcare provider at least once a year.  Normal blood pressure is 120/80.  Weight- Have your body mass index (BMI) calculated to screen for obesity.  BMI is a measure of body fat based on height and weight.  You can calculate your own BMI at [employer]  Cholesterol- Have your cholesterol checked every year.  Diabetes- Have your blood sugar checked every year if you have high blood pressure, high cholesterol, a family history of diabetes or if you are overweight.  Pap Test - Have a pap test every 1 to 5 years if you have been sexually active.  If you are older than 65 and recent pap tests have been normal you may not need additional pap tests.  In addition, if you have had a hysterectomy  for benign disease additional pap tests are not necessary.  Mammogram-Yearly mammograms are essential for early detection of breast cancer  Screening for Colon Cancer- Colonoscopy starting at age 66. Screening may begin sooner  depending on your family history and other health conditions.  Follow up colonoscopy as directed by your Gastroenterologist.  Screening for Osteoporosis- Screening begins at age 98 with bone density scanning, sooner if you are at higher risk for developing Osteoporosis.  Get these medicines  Calcium with Vitamin D- Your body requires 1200-1500 mg of Calcium a day and [phone] IU of Vitamin D a day.  You can only absorb 500 mg of Calcium at a time therefore Calcium must be taken in 2 or 3 separate doses throughout  the day.  Hormones- Hormone therapy has been associated with increased risk for certain cancers and heart disease.  Talk to your healthcare provider about if you need relief from menopausal symptoms.  Aspirin- Ask your healthcare provider about taking Aspirin to prevent Heart Disease and Stroke.  Get these Immuniztions  Flu shot- Every fall  Pneumonia shot- Once after the age of 3; if you are younger ask your healthcare provider if you need a pneumonia shot.  Tetanus- Every ten years.  Zostavax- Once after the age of 54 to prevent shingles.  Take these steps  Don't smoke- Your healthcare provider can help you quit. For tips on how to quit, ask your healthcare provider or go to www.smokefree.gov or call 1-800 QUIT-NOW.  Be physically active- Exercise 5 days a week for a minimum of 30 minutes.  If you are not already physically active, start slow and gradually work up to 30 minutes of moderate physical activity.  Try walking, dancing, bike riding, swimming, etc.  Eat a healthy diet- Eat a variety of healthy foods such as fruits, vegetables, whole grains, low fat milk, low fat cheeses, yogurt, lean meats, chicken, fish, eggs, dried beans, tofu, etc.  For more information go to www.thenutritionsource.org  Dental visit- Brush and floss teeth twice daily; visit your dentist twice a year.  Eye exam- Visit your Optometrist or Ophthalmologist yearly.  Drink alcohol in moderation- Limit alcohol intake to one drink or less a day.  Never drink and drive.  Depression- Your emotional health is as important as your physical health.  If you're feeling down or losing interest in things you normally enjoy, please talk to your healthcare provider.  Seat Belts- can save your life; always wear one  Smoke/Carbon Monoxide detectors- These detectors need to be installed on the appropriate level of your home.  Replace batteries at least once a year.  Violence- If anyone is threatening or hurting you,  please tell your healthcare provider.  Living Will/ Health care power of attorney- Discuss with your healthcare provider and family.    IF you received an x-ray today, you will receive an invoice from [facility]. Please contact [facility]iology at [phone] with questions or concerns regarding your invoice.   IF you received labwork today, you will receive an invoice from [place]. Please contact [facility] at [phone] with questions or concerns regarding your invoice.   Our billing staff will not be able to assist you with questions regarding bills from these companies.  You will be contacted with the lab results as soon as they are available. The fastest way to get your results is to activate your My Chart account. Instructions are located on the last page of this paperwork. If you have not heard from [place] regarding the results in 2 weeks, please contact this office.        I personally performed the services described in this documentation, which was scribed in my presence. The recorded information has  been reviewed and considered, and addended by me as needed.   Signed,   [name], MD [facility].  03/01/16 10:51 PM

## 2016-03-01 LAB — COMPREHENSIVE METABOLIC PANEL
ALT: 18 IU/L (ref 0–32)
AST: 19 IU/L (ref 0–40)
Albumin/Globulin Ratio: 1.8 (ref 1.2–2.2)
Albumin: 4.4 g/dL (ref 3.5–4.8)
Alkaline Phosphatase: 92 IU/L (ref 39–117)
BUN/Creatinine Ratio: 30 — ABNORMAL HIGH (ref 12–28)
BUN: 26 mg/dL (ref 8–27)
Bilirubin Total: 0.5 mg/dL (ref 0.0–1.2)
CALCIUM: 9.8 mg/dL (ref 8.7–10.3)
CO2: 27 mmol/L (ref 18–29)
Chloride: 96 mmol/L (ref 96–106)
Creatinine, Ser: 0.88 mg/dL (ref 0.57–1.00)
GFR calc Af Amer: 72 mL/min/{1.73_m2} (ref >59)
GFR calc non Af Amer: 63 mL/min/{1.73_m2} (ref >59)
GLUCOSE: 99 mg/dL (ref 65–99)
Globulin, Total: 2.4 g/dL (ref 1.5–4.5)
Potassium: 4.3 mmol/L (ref 3.5–5.2)
SODIUM: 138 mmol/L (ref 134–144)
Total Protein: 6.8 g/dL (ref 6.0–8.5)

## 2016-03-01 LAB — HEMOGLOBIN A1C
ESTIMATED AVERAGE GLUCOSE: 128 mg/dL
HEMOGLOBIN A1C: 6.1 % — AB (ref 4.8–5.6)

## 2016-03-01 LAB — LIPID PANEL
Chol/HDL Ratio: 4.9 ratio units — ABNORMAL HIGH (ref 0.0–4.4)
Cholesterol, Total: 295 mg/dL — ABNORMAL HIGH (ref 100–199)
HDL: 60 mg/dL (ref >39)
LDL Calculated: 198 mg/dL — ABNORMAL HIGH (ref 0–99)
TRIGLYCERIDES: 187 mg/dL — AB (ref 0–149)
VLDL Cholesterol Cal: 37 mg/dL (ref 5–40)

## 2016-03-01 LAB — TSH: TSH: 2.7 u[IU]/mL (ref 0.450–4.500)

## 2016-03-16 ENCOUNTER — Telehealth: Payer: Self-pay | Admitting: Emergency Medicine

## 2016-03-16 NOTE — Telephone Encounter (Signed)
-----   Message from Wendie Agreste, MD sent at 03/06/2016  8:43 PM EST ----- Call patient. Thyroid test normal. Electrolytes overall normal, kidney and liver tests normal. Prediabetes tests still at prediabetes level - stable. Cholesterol was higher than last year. Has she been missing any doses of the simvastatin? If not we may need to try to increase dose or change to another medication for improved control. Let me know.

## 2016-03-18 ENCOUNTER — Ambulatory Visit
Admission: RE | Admit: 2016-03-18 | Discharge: 2016-03-18 | Disposition: A | Discharge status: Home / Self Care | Payer: Medicare Other | Source: Ambulatory Visit | Attending: Oncology | Admitting: Oncology

## 2016-03-18 DIAGNOSIS — Z853 Personal history of malignant neoplasm of breast: Secondary | ICD-10-CM

## 2016-04-02 ENCOUNTER — Ambulatory Visit (INDEPENDENT_AMBULATORY_CARE_PROVIDER_SITE_OTHER): Payer: Medicare Other | Admitting: Orthopaedic Surgery

## 2016-04-15 ENCOUNTER — Other Ambulatory Visit: Payer: Self-pay | Admitting: *Deleted

## 2016-04-15 DIAGNOSIS — C50119 Malignant neoplasm of central portion of unspecified female breast: Secondary | ICD-10-CM

## 2016-04-16 ENCOUNTER — Ambulatory Visit (HOSPITAL_BASED_OUTPATIENT_CLINIC_OR_DEPARTMENT_OTHER): Payer: Medicare Other | Admitting: Oncology

## 2016-04-16 ENCOUNTER — Other Ambulatory Visit (HOSPITAL_BASED_OUTPATIENT_CLINIC_OR_DEPARTMENT_OTHER): Payer: Medicare Other

## 2016-04-16 VITALS — BP 156/70 | HR 55 | Temp 97.7°F | Resp 17 | Ht 64.0 in | Wt 165.1 lb

## 2016-04-16 DIAGNOSIS — C50011 Malignant neoplasm of nipple and areola, right female breast: Secondary | ICD-10-CM

## 2016-04-16 DIAGNOSIS — C50111 Malignant neoplasm of central portion of right female breast: Secondary | ICD-10-CM

## 2016-04-16 DIAGNOSIS — Z170 Estrogen receptor positive status [ER+]: Secondary | ICD-10-CM | POA: Diagnosis not present

## 2016-04-16 DIAGNOSIS — Z79811 Long term (current) use of aromatase inhibitors: Secondary | ICD-10-CM | POA: Diagnosis not present

## 2016-04-16 DIAGNOSIS — L719 Rosacea, unspecified: Secondary | ICD-10-CM

## 2016-04-16 DIAGNOSIS — C50119 Malignant neoplasm of central portion of unspecified female breast: Secondary | ICD-10-CM

## 2016-04-16 LAB — COMPREHENSIVE METABOLIC PANEL
ALT: 14 U/L (ref 0–55)
ANION GAP: 9 meq/L (ref 3–11)
AST: 14 U/L (ref 5–34)
Albumin: 3.7 g/dL (ref 3.5–5.0)
Alkaline Phosphatase: 93 U/L (ref 40–150)
BILIRUBIN TOTAL: 0.53 mg/dL (ref 0.20–1.20)
BUN: 21.5 mg/dL (ref 7.0–26.0)
CALCIUM: 10 mg/dL (ref 8.4–10.4)
CHLORIDE: 101 meq/L (ref 98–109)
CO2: 28 meq/L (ref 22–29)
CREATININE: 0.9 mg/dL (ref 0.6–1.1)
EGFR: 65 mL/min/{1.73_m2} — ABNORMAL LOW (ref >90)
Glucose: 94 mg/dl (ref 70–140)
Potassium: 3.7 mEq/L (ref 3.5–5.1)
Sodium: 138 mEq/L (ref 136–145)
TOTAL PROTEIN: 7 g/dL (ref 6.4–8.3)

## 2016-04-16 LAB — CBC WITH DIFFERENTIAL/PLATELET
BASO%: 0.8 % (ref 0.0–2.0)
Basophils Absolute: 0.1 10*3/uL (ref 0.0–0.1)
EOS%: 2.1 % (ref 0.0–7.0)
Eosinophils Absolute: 0.1 10*3/uL (ref 0.0–0.5)
HEMATOCRIT: 39.4 % (ref 34.8–46.6)
HGB: 13.4 g/dL (ref 11.6–15.9)
LYMPH#: 1.5 10*3/uL (ref 0.9–3.3)
LYMPH%: 26.1 % (ref 14.0–49.7)
MCH: 31.7 pg (ref 25.1–34.0)
MCHC: 34 g/dL (ref 31.5–36.0)
MCV: 93.3 fL (ref 79.5–101.0)
MONO#: 0.6 10*3/uL (ref 0.1–0.9)
MONO%: 10.6 % (ref 0.0–14.0)
NEUT%: 60.4 % (ref 38.4–76.8)
NEUTROS ABS: 3.6 10*3/uL (ref 1.5–6.5)
PLATELETS: 253 10*3/uL (ref 145–400)
RBC: 4.22 10*6/uL (ref 3.70–5.45)
RDW: 13.6 % (ref 11.2–14.5)
WBC: 5.9 10*3/uL (ref 3.9–10.3)

## 2016-04-16 MED ORDER — TETRACYCLINE HCL 250 MG PO CAPS: 250.0000 mg | ORAL_CAPSULE | Freq: Every day | ORAL | 4 refills | Status: DC

## 2016-04-16 NOTE — Progress Notes (Signed)
[place]  [phone] [phone]  OFFICE PROGRESS NOTE   ID: Francina Ames Valente   DOB: Jul 15, 1934  MR#: [id number]  ZHG#:[id number]   PCP: Ellsworth Lennox, MD SU: Erroll Luna, M.D. RAD ONC: Eppie Gibson, M.D.  CHIEF COMPLAINT: hx right breast cancer  CURRENT TREATMENT: anastrozole daily  BREAST CANCER HISTORY: From Dr. Collier Salina Rubin's new patient evaluation note dated 02/28/2011:  "Palpable breast mass noted x 6 months. She is a pleasant 82 yo woman with no significant past medical history, noted crusting of lt nipple ~ 6 months with some thickening of that nipple/areola. She underwent mammography on 11/16/10, which showed erythema and thickening of the nipple. U/s showed mixed echogenicity of the nipple and a fairly superficial lesion. A punch biopsy of the area on 11/28/10 showed invasive cancer , Grade 2, er 100%, pr 69%, ki 67 6%, her 2 1.33.  A MRI of both breasts on 12/13/10, showed  enhancement of the area measuring 1.5 x 2.3 x 1.4 cm.   She underwent lumpectomy on 01/22/11, which showed a 1.5 cm G2 lesion with dermal involvement, 1 negative sentinel node and clear margins."   Her subsequent history is as detailed below.   INTERVAL HISTORY: Maxcine returns today for follow-up of her estrogen receptor positive breast cancer. She continues on anastrozole  I spent approximately 30 minutes with Jana Half with most of that time spent discussing her complex problems. , with good tolerance.  Hot flashes and vaginal dryness are not a major issue. She never developed the arthralgias or myalgias that many patients can experience on this medication. She obtains it at a good price.  REVIEW OF SYSTEMS: Shayleen Eppinger to have problems with rosacea. I wrote her a prescription for metronidazole gel but she was not able to afford it. She also is having left hip problems and will likely have surgery under Dr. Wynelle Link sometime later this year. She notes that her  husband's dementia is a little bit worse and she now has become his primary caregiver. This limits her somewhat but the fact that their son lives with him is helpful. Aside from these issues a detailed review of systems today was stable  PAST MEDICAL HISTORY: Past Medical History:  Diagnosis Date  . Arthritis   . Breast cancer ([place]) 11/2010   Rt breast  . Cataract   . Hypercholesteremia   . Hypertension    NO CARDIAC MD,  [facility]  PCP  ,  URGENT MED  . Leg laceration    WITH SURGERY  . S/P radiation therapy 03/14/11 - 04/05/11   Right Breast / 4256 cGy in 16 Fractions  . Thyroid disease     PAST SURGICAL HISTORY: Past Surgical History:  Procedure Laterality Date  . BREAST LUMPECTOMY  01/22/11   RIGHT BREAST LUMPECTOMY WITH BIOSPY OF 1 SENTINEL NODE, INVASIVE GRADE II DUCTAL CARCINOMA , INTERMEDIATE GRADE DUCTAL CARCINOMA IN SITU , DERMAL SKIN INVOLVED, MARGINS NEGATIVE,( 0/1)  NODE POSITIVE, ER+, PR+, LOW s-PHASE, HER 2 NEU- NO AMPLIFICATION  . BREAST SURGERY  11/28/10   SKIN-PUNCH BIOSPY RIGHT BREAST(NIPPLE), ER+, RP+, LOW S-PHASE, HER 2NEU NEGAITIVE  . EYE SURGERY      BILATERAL CATARACT SURGERY  . PARTIAL MASTECTOMY WITH NEEDLE LOCALIZATION  01/10/2012   Procedure: PARTIAL MASTECTOMY WITH NEEDLE LOCALIZATION;  Surgeon: Marcello Moores A. Cornett, MD;  Location: [facility];  Service: General;  Laterality: Right;  right breast needle localized partial mastectomy  . [place]  HISTORY Family History  Problem Relation Age of Onset  . Kidney disease Mother     kidney failure   . Kidney failure Mother   . Stroke Mother   . Heart disease Father     heart attack   . Heart attack Father   . Lymphoma Sister   . Pancreatitis Sister     GYNECOLOGIC HISTORY: Gravida 3, para 2, one stillbirth, age of menarche 82, H.  He 76, age of menopause 61, no history of birth control use or hormonal replacement therapy.  SOCIAL HISTORY: Mr. and Mrs. Girardot has been  married since 1957.  Her husband Pleasant Forensic psychologist is a retired Dealer.  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 She and her husband have 2 adult children, a son that is 64 years old and her daughter that is 16 years old.  Her son resides in [place], [place] and her daughter resides in [place], [place].  They have 6 grandchildren and 4 great-grandchildren.  In her spare time Mrs. Volkert enjoys spending time with her family, cooking, traveling, and gardening.   ADVANCED DIRECTIVES: Not on file  HEALTH MAINTENANCE: Social History  Substance Use Topics  . Smoking status: Never Smoker  . Smokeless tobacco: Never Used  . Alcohol use No     Comment: Rarely/On Special Occasions    Colonoscopy:  PAP:  Bone density: Repeat bone density 2016 was stable Lipid panel:   No Known Allergies  Current Outpatient Prescriptions  Medication Sig Dispense Refill  . anastrozole (ARIMIDEX) 1 MG tablet Take 1 tablet (1 mg total) by mouth daily. 90 tablet 4  . aspirin EC 81 MG tablet Take 81 mg by mouth daily.      Marland Kitchen atenolol-chlorthalidone (TENORETIC) 50-25 MG tablet Take 1 tablet by mouth daily. 90 tablet 3  . cholecalciferol (VITAMIN D) 1000 units tablet Take 1,000 Units by mouth daily.    Marland Kitchen ipratropium (ATROVENT) 0.03 % nasal spray Place 2 sprays into both nostrils 2 (two) times daily. As needed 30 mL 5  . levothyroxine (SYNTHROID, LEVOTHROID) 50 MCG tablet Take 1 tablet (50 mcg total) by mouth daily before breakfast. 90 tablet 3  . metroNIDAZOLE (METROGEL) 0.75 % gel Apply 1 application topically 2 (two) times daily. 45 g 3  . simvastatin (ZOCOR) 20 MG tablet Take 1 tablet (20 mg total) by mouth daily. 90 tablet 3   No current facility-administered medications for this visit.     OBJECTIVE: Older white woman   Vitals:   04/16/16 1023  BP: (!) 156/70  Pulse: (!) 55   Resp: 17  Temp: 97.7 F (36.5 C)     Body mass index is 28.34 kg/m.      ECOG FS: 1 - Symptomatic but completely ambulatory  Sclerae unicteric, pupils round and equal Oropharynx clear and moist-- no thrush or other lesions No cervical or supraclavicular adenopathy Lungs no rales or rhonchi Heart regular rate and rhythm Abd soft, nontender, positive bowel sounds MSK no focal spinal tenderness, no upper extremity lymphedema Neuro: nonfocal, well oriented, appropriate affect Breasts: Status post central lumpectomy on the right, with no evidence of disease recurrence. The right axilla is benign. The left breast is unremarkable. Skin: Shows ongoing rosacea, mild to moderate    LAB RESULTS: Lab Results  Component Value Date   WBC 5.9 04/16/2016   NEUTROABS 3.6 04/16/2016  HGB 13.4 04/16/2016   HCT 39.4 04/16/2016   MCV 93.3 04/16/2016   PLT 253 04/16/2016      Chemistry      Component Value Date/Time   NA 138 04/16/2016 0952   K 3.7 04/16/2016 0952   CL 96 02/29/2016 1606   CO2 28 04/16/2016 0952   BUN 21.5 04/16/2016 0952   CREATININE 0.9 04/16/2016 0952      Component Value Date/Time   CALCIUM 10.0 04/16/2016 0952   ALKPHOS 93 04/16/2016 0952   AST 14 04/16/2016 0952   ALT 14 04/16/2016 0952   BILITOT 0.53 04/16/2016 0952       Lab Results  Component Value Date   LABCA2 41 (H) 02/28/2011    Urinalysis No results found for: COLORURINE  STUDIES: CLINICAL DATA:  82 year old female presenting for routine evaluation status post right breast lumpectomy in 2013.  EXAM: 2D DIGITAL DIAGNOSTIC BILATERAL MAMMOGRAM WITH CAD AND ADJUNCT TOMO  COMPARISON:  Previous exam(s).  ACR Breast Density Category b: There are scattered areas of fibroglandular density.  FINDINGS: Stable right breast lumpectomy site. No suspicious calcifications, masses or areas of distortion are seen in the bilateral breasts.  Mammographic images were processed with  CAD.  IMPRESSION: Stable right breast lumpectomy site. No mammographic evidence of malignancy in the bilateral breasts.  RECOMMENDATION: Diagnostic mammogram is suggested in 1 year. (Code:DM-B-01Y)  I have discussed the findings and recommendations with the patient. Results were also provided in writing at the conclusion of the visit. If applicable, a reminder letter will be sent to the patient regarding the next appointment.  BI-RADS CATEGORY  2: Benign.   Electronically Signed   By: Ammie Ferrier M.D.   On: 03/18/2016 11:31hysterectomy Magrinat review Ms. Elderly lady an and scattered rosacea which is bothersome wrote her for [employer] tells me they wanted to discharge $200 for this enzyme right ear and leg  ASSESSMENT: 82 y.o. [place], [place] woman: 1.  Status post skin punch breast biopsy on 11/28/2010 which showed invasive ductal carcinoma grade 1-2.  2.  The patient had bilateral breast MRI on 12/13/2010 which showed there is asymmetric persistent enhancement of the right nipple and subareolar breast parenchyma.  This measures 1.5 x 2.3 x 1.4 cm.  No other areas of suspicious enhancement are identified in either breast.  No enlarged internal mammary or axillary lymph nodes are identified (clinical stage IIA, T2 N0).  3.  Status post right breast lumpectomy with right sentinel node biopsy on 01/22/2011 for a stage I, pT1c, pN0, pMX, 1.5 cm invasive ductal carcinoma grade 2, with intermediate grade ductal carcinoma in situ, and negative margins, estrogen receptor 100% positive, progesterone receptor 69% positive, Ki-67 60%, HER-2/neu by CISH no amplification with a ratio 1.21, with 0/1 metastatic right axillary lymph nodes.  4.  The patient underwent radiation therapy from 03/14/2011 through 04/05/2011.  5.  The patient started antiestrogen therapy with Arimidex in 03/2011.  6.  Status post right breast needle core biopsy at the 12 o'clock position on  12/23/2011 which showed atypical ductal hyperplasia with calcifications, fibrocystic changes with calcifications, and stromal calcifications.  7.  Status post right breast lumpectomy on 01/10/2012 which showed fibrocystic changes, benign calcifications, healing biopsy site, no evidence of malignancy.  PLAN: Jaquaya is now a little over 5 years out from definitive surgery for her breast cancer with no evidence of disease recurrence. This is very favorable.  She is also completing 5 years of anastrozole this month.  In some  patients who are at high risk of recurrence we suggest continuing anastrozole beyond 5 years. That is not Jessieca's case and I'm very comfortable with her stopping anastrozole at this time.  We discussed our survivorship program. She is not interested in participating.  All she will need in terms of breast cancer follow-up is yearly mammography and a yearly physician breast exam.  Incidentally she'll brought a couple of times taken by her sister, who was my patient 18 years ago.  Finally I note that she was not able to afford the metronidazole I wrote her for her rosacea. I gave her a new prescription for tetracycline to take 250 mg daily in hopes that this might at least am ameliorate the rosacea symptoms to some degree.  I will be glad to see Niko again at any point in the future but as of now we are making no further routine appointments for her here.   Chauncey Cruel, MD  04/16/16

## 2016-05-06 ENCOUNTER — Telehealth: Payer: Self-pay | Admitting: General Practice

## 2016-05-06 NOTE — Telephone Encounter (Signed)
Pt is needing a copy of her xrays of her hip to take to ortho on march the 15th   Best number is [phone] and it is ok to leave a message on voicemail when ready

## 2016-05-06 NOTE — Telephone Encounter (Signed)
Patient is requesting a copy of her x-ray. Please call once disc is ready for pick up  [phone].  Molly Lambert. Kenton Kingfisher, MSN, FNP-C [facility]

## 2016-05-07 NOTE — Telephone Encounter (Signed)
At the patient's request, I made a copy of her x - ray on CD , and I placed in the pick up drawer.

## 2016-05-17 ENCOUNTER — Telehealth: Payer: Self-pay | Admitting: Family Medicine

## 2016-05-17 NOTE — Telephone Encounter (Signed)
Received request for surgical clearance.  Will need office visit for preoperative clearance and testing. PLease call her to schedule this.

## 2016-05-20 NOTE — Telephone Encounter (Signed)
LVM for pt to schedule an appt with you for preop clearance.  05/20/16 9:57am

## 2016-05-25 ENCOUNTER — Ambulatory Visit (INDEPENDENT_AMBULATORY_CARE_PROVIDER_SITE_OTHER): Payer: Medicare Other

## 2016-05-25 ENCOUNTER — Ambulatory Visit (INDEPENDENT_AMBULATORY_CARE_PROVIDER_SITE_OTHER): Payer: Medicare Other | Admitting: Family Medicine

## 2016-05-25 VITALS — BP 156/80 | HR 61 | Temp 98.3°F | Resp 18 | Ht 64.0 in | Wt 168.4 lb

## 2016-05-25 DIAGNOSIS — R001 Bradycardia, unspecified: Secondary | ICD-10-CM

## 2016-05-25 DIAGNOSIS — R9431 Abnormal electrocardiogram [ECG] [EKG]: Secondary | ICD-10-CM

## 2016-05-25 DIAGNOSIS — I10 Essential (primary) hypertension: Secondary | ICD-10-CM

## 2016-05-25 DIAGNOSIS — Z01818 Encounter for other preprocedural examination: Secondary | ICD-10-CM

## 2016-05-25 MED ORDER — AMLODIPINE BESYLATE 5 MG PO TABS: 5.0000 mg | ORAL_TABLET | Freq: Every day | ORAL | 2 refills | Status: DC

## 2016-05-25 MED ORDER — CHLORTHALIDONE 25 MG PO TABS: 25.0000 mg | ORAL_TABLET | Freq: Every day | ORAL | 3 refills | Status: DC

## 2016-05-25 NOTE — Progress Notes (Signed)
Subjective:  By signing my name below, I, Essence Howell, attest that this documentation has been prepared under the direction and in the presence of Wendie Agreste, MD Electronically Signed: Ladene Artist, ED Scribe 05/25/2016 at 12:13 PM.   Patient ID: Molly Lambert, female    DOB: 1934-10-24, 82 y.o.   MRN: [id number]  Chief Complaint  Patient presents with  . Pre-op Exam    Scheduled for surgery on May 23rd (left hip replacement)   HPI Molly Lambert is a 82 y.o. female who presents to Primary Care at [facility], [place]. Pt is planning for a left hip replacement, intermediate risk procedure, on May 23. Here for pre-op clearance. H/o HTN, hypothyroidism, hyperlipidemia, and h/o breast CA 2012. She denies any surgeries requiring anesthesia since breast surgery in 3354; denies complications. Pt denies any heart complications including valve disorders, arhythmia or blocked arteries. She also denies chest pain, sob, light-headedness, dizziness with cleaning the house or walking up stairs/hills. She also denies cough, wheezing, orthopnea or PND. No h/o anemia. Pt is a nonsmoker. On 2/20, CMP and CBC were normal. Pt's last TSH was normal just under 3 months ago. She is not taking a baby aspirin daily.   Patient Active Problem List   Diagnosis Date Noted  . Breast microcalcifications 12/27/2011  . History of breast cancer 12/27/2011  . Hypothyroidism 08/25/2011  . Hypercholesteremia 08/25/2011  . HTN (hypertension) 08/25/2011  . Malignant neoplasm of central portion of right breast in female, estrogen receptor positive ([place]) 02/27/2011  . Breast mass, right 11/28/2010   Past Medical History:  Diagnosis Date  . Arthritis   . Breast cancer ([place]) 11/2010   Rt breast  . Cataract   . Hypercholesteremia   . Hypertension    NO CARDIAC MD,  [facility]  PCP  ,  URGENT MED  . Leg laceration    WITH SURGERY  . S/P radiation therapy 03/14/11 - 04/05/11   Right Breast / 4256 cGy in 16 Fractions  . Thyroid  disease    Past Surgical History:  Procedure Laterality Date  . BREAST LUMPECTOMY  01/22/11   RIGHT BREAST LUMPECTOMY WITH BIOSPY OF 1 SENTINEL NODE, INVASIVE GRADE II DUCTAL CARCINOMA , INTERMEDIATE GRADE DUCTAL CARCINOMA IN SITU , DERMAL SKIN INVOLVED, MARGINS NEGATIVE,( 0/1)  NODE POSITIVE, ER+, PR+, LOW s-PHASE, HER 2 NEU- NO AMPLIFICATION  . BREAST SURGERY  11/28/10   SKIN-PUNCH BIOSPY RIGHT BREAST(NIPPLE), ER+, RP+, LOW S-PHASE, HER 2NEU NEGAITIVE  . EYE SURGERY      BILATERAL CATARACT SURGERY  . PARTIAL MASTECTOMY WITH NEEDLE LOCALIZATION  01/10/2012   Procedure: PARTIAL MASTECTOMY WITH NEEDLE LOCALIZATION;  Surgeon: Marcello Moores A. Cornett, MD;  Location: [facility];  Service: General;  Laterality: Right;  right breast needle localized partial mastectomy  . TUBAL LIGATION  1972   No Known Allergies Prior to Admission medications   Medication Sig Start Date End Date Taking? Authorizing Provider  aspirin EC 81 MG tablet Take 81 mg by mouth daily.     Yes Historical Provider, MD  atenolol-chlorthalidone (TENORETIC) 50-25 MG tablet Take 1 tablet by mouth daily. 02/29/16  Yes Wendie Agreste, MD  cholecalciferol (VITAMIN D) 1000 units tablet Take 1,000 Units by mouth daily.   Yes Historical Provider, MD  ipratropium (ATROVENT) 0.03 % nasal spray Place 2 sprays into both nostrils 2 (two) times daily. As needed 02/29/16  Yes Wendie Agreste, MD  levothyroxine (SYNTHROID, LEVOTHROID) 50 MCG tablet Take 1 tablet (50 mcg  total) by mouth daily before breakfast. 02/29/16  Yes Wendie Agreste, MD  simvastatin (ZOCOR) 20 MG tablet Take 1 tablet (20 mg total) by mouth daily. 02/29/16  Yes Wendie Agreste, MD  tetracycline (ACHROMYCIN,SUMYCIN) 250 MG capsule Take 1 capsule (250 mg total) by mouth daily. Patient not taking: Reported on 05/25/2016 04/16/16   Chauncey Cruel, MD   Social History   Social History  . Marital status: Married    Spouse name: N/A  . Number of children: 3  . Years  of education: 12   Occupational History  . Waitress     [employer]   Social History Main Topics  . Smoking status: Never Smoker  . Smokeless tobacco: Never Used  . Alcohol use No     Comment: Rarely/On Special Occasions  . Drug use: No  . Sexual activity: No     Comment: G3, P3, MENARCHE, AGE 89, MENOPAUSE AGE 51, NO BC AND HRT X 10 YEARS   Other Topics Concern  . Not on file   Social History Narrative   Married for 56 years      3 children, one deceased   58 grand-children, 3 great-grandchildren   Review of Systems  Respiratory: Negative for cough, shortness of breath and wheezing.   Cardiovascular: Negative for chest pain.  Neurological: Negative for dizziness and light-headedness.      Objective:   Physical Exam  Constitutional: She is oriented to person, place, and time. She appears well-developed and well-nourished.  HENT:  Head: Normocephalic and atraumatic.  Eyes: Conjunctivae and EOM are normal. Pupils are equal, round, and reactive to light.  Neck: No JVD present. Carotid bruit is not present.  Cardiovascular: Normal rate, regular rhythm, normal heart sounds and intact distal pulses.   No murmur heard. Pulmonary/Chest: Effort normal and breath sounds normal.  Abdominal: Soft. She exhibits no pulsatile midline mass. There is no tenderness.  Neurological: She is alert and oriented to person, place, and time.  Skin: Skin is warm and dry.  Psychiatric: She has a normal mood and affect. Her behavior is normal.  Vitals reviewed.  Vitals:   05/25/16 1057  BP: (!) 156/80  Pulse: 61  Resp: 18  Temp: 98.3 F (36.8 C)  TempSrc: Oral  SpO2: 97%  Weight: 168 lb 6 oz (76.4 kg)  Height: '5\' 4"'  (1.626 m)   Results for orders placed or performed in visit on 04/16/16  CBC with Differential  Result Value Ref Range   WBC 5.9 3.9 - 10.3 10e3/uL   NEUT# 3.6 1.5 - 6.5 10e3/uL   HGB 13.4 11.6 - 15.9 g/dL   HCT 39.4 34.8 - 46.6 %   Platelets 253 145 - 400  10e3/uL   MCV 93.3 79.5 - 101.0 fL   MCH 31.7 25.1 - 34.0 pg   MCHC 34.0 31.5 - 36.0 g/dL   RBC 4.22 3.70 - 5.45 10e6/uL   RDW 13.6 11.2 - 14.5 %   lymph# 1.5 0.9 - 3.3 10e3/uL   MONO# 0.6 0.1 - 0.9 10e3/uL   Eosinophils Absolute 0.1 0.0 - 0.5 10e3/uL   Basophils Absolute 0.1 0.0 - 0.1 10e3/uL   NEUT% 60.4 38.4 - 76.8 %   LYMPH% 26.1 14.0 - 49.7 %   MONO% 10.6 0.0 - 14.0 %   EOS% 2.1 0.0 - 7.0 %   BASO% 0.8 0.0 - 2.0 %  Comprehensive metabolic panel  Result Value Ref Range   Sodium 138 136 - 145  mEq/L   Potassium 3.7 3.5 - 5.1 mEq/L   Chloride 101 98 - 109 mEq/L   CO2 28 22 - 29 mEq/L   Glucose 94 70 - 140 mg/dl   BUN 21.5 7.0 - 26.0 mg/dL   Creatinine 0.9 0.6 - 1.1 mg/dL   Total Bilirubin 0.53 0.20 - 1.20 mg/dL   Alkaline Phosphatase 93 40 - 150 U/L   AST 14 5 - 34 U/L   ALT 14 0 - 55 U/L   Total Protein 7.0 6.4 - 8.3 g/dL   Albumin 3.7 3.5 - 5.0 g/dL   Calcium 10.0 8.4 - 10.4 mg/dL   Anion Gap 9 3 - 11 mEq/L   EGFR 65 (L) >90 ml/min/1.73 m2  Dg Chest 2 View  Result Date: 05/25/2016 CLINICAL DATA:  Preop hip replacement EXAM: CHEST  2 VIEW COMPARISON:  01/15/2011 FINDINGS: There is no focal parenchymal opacity. There is no pleural effusion or pneumothorax. The heart and mediastinal contours are unremarkable. There are multiple surgical clips in the right breast from partial mastectomy. The osseous structures are unremarkable. IMPRESSION: No active cardiopulmonary disease. Electronically Signed   By: Kathreen Devoid   On: 05/25/2016 12:49   EKG reading done by Wendie Agreste, MD: Marked sinus bradycardia. Rate 46. Poor R wave progression but without any significant change from November 2013.     Assessment & Plan:    Molly Lambert is a 82 y.o. female Preop examination - Plan: EKG 12-Lead, DG Chest 2 View, Ambulatory referral to Cardiology  - Based on functional status, apparently can achieve at least 4-6 mets of activity without difficulty. Intermediate risk surgery, does  not appear to have any increased risk of major adverse cardiac event. However her EKG does indicate some poor R-wave progression, and significant bradycardia, so we'll refer to cardiology to determine if other testing needed.   -Recent CBC and CMP were noted without concerns. Chest x-ray without acute findings.  Essential hypertension - Plan: EKG 12-Lead, DG Chest 2 View, amLODipine (NORVASC) 5 MG tablet, chlorthalidone (HYGROTON) 25 MG tablet Bradycardia - Plan: Ambulatory referral to Cardiology  - Borderline/decreased control blood pressure, but is also significantly bradycardic. On further discussion she does have occasional fatigue and lightheadedness. Likely due to atenolol.    -Change regimen to amlodipine 5 mg daily, continue chlorthalidone 25 mg daily, recheck in 2 weeks with home readings. RTC precautions if worsening sooner  Nonspecific abnormal electrocardiogram (ECG) (EKG) - Plan: Ambulatory referral to Cardiology  - As above refer to cardiology, but EKG appears similar to previous in 2012. Can decide if other testing needed prior to surgical clearance.  Meds ordered this encounter  Medications  . amLODipine (NORVASC) 5 MG tablet    Sig: Take 1 tablet (5 mg total) by mouth daily.    Dispense:  30 tablet    Refill:  2  . chlorthalidone (HYGROTON) 25 MG tablet    Sig: Take 1 tablet (25 mg total) by mouth daily.    Dispense:  30 tablet    Refill:  3   Patient Instructions     Blood pressure running a little high. But heart rate is too low. Atenolol likely will make that worse. I changed you to amlodipine 5 mg once per day, continue chlorthalidone 25 mg once per day. Recheck with me in the next 2 weeks, but keep a record of your blood pressures outside of the office for that visit. If running over 160/100 recheck sooner. Additionally with low  heart rate and borderline abnormal EKG, I would like you to meet with a cardiologist to see if other testing is needed prior to your surgery.  I placed that referral.    Bradycardia, Adult Bradycardia is a slower-than-normal heartbeat. A normal resting heart rate for an adult ranges from 60 to 100 beats per minute. With bradycardia, the resting heart rate is less than 60 beats per minute. Bradycardia can prevent enough oxygen from reaching certain areas of your body when you are active. It can be serious if it keeps enough oxygen from reaching your brain and other parts of your body. Bradycardia is not a problem for everyone. For some healthy adults, a slow resting heart rate is normal. What are the causes? This condition may be caused by:  A problem with the heart, including:  A problem with the heart's electrical system, such as a heart block.  A problem with the heart's natural pacemaker (sinus node).  Heart disease.  A heart attack.  Heart damage.  A heart infection.  A heart condition that is present at birth (congenital heart defect).  Certain medicines that treat heart conditions.  Certain conditions, such as hypothyroidism and obstructive sleep apnea.  Problems with the balance of chemicals and other substances, like potassium, in the blood. What increases the risk? This condition is more likely to develop in adults who:  Are age 41 or older.  Have high blood pressure (hypertension), high cholesterol (hyperlipidemia), or diabetes.  Drink heavily, use tobacco or nicotine products, or use drugs.  Are stressed. What are the signs or symptoms? Symptoms of this condition include:  Light-headedness.  Feeling faint or fainting.  Fatigue and weakness.  Shortness of breath.  Chest pain (angina).  Drowsiness.  Confusion.  Dizziness. How is this diagnosed? This condition may be diagnosed based on:  Your symptoms.  Your medical history.  A physical exam. During the exam, your health care provider will listen to your heartbeat and check your pulse. To confirm the diagnosis, your health care  provider may order tests, such as:  Blood tests.  An electrocardiogram (ECG). This test records the heart's electrical activity. The test can show how fast your heart is beating and whether the heartbeat is steady.  A test in which you wear a portable device (event recorder or Holter monitor) to record your heart's electrical activity while you go about your day.  Anexercise test. How is this treated? Treatment for this condition depends on the cause of the condition and how severe your symptoms are. Treatment may involve:  Treatment of the underlying condition.  Changing your medicines or how much medicine you take.  Having a small, battery-operated device called a pacemaker implanted under the skin. When bradycardia occurs, this device can be used to increase your heart rate and help your heart to beat in a regular rhythm. Follow these instructions at home: Lifestyle    Manage any health conditions that contribute to bradycardia as told by your health care provider.  Follow a heart-healthy diet. A nutrition specialist (dietitian) can help to educate you about healthy food options and changes.  Follow an exercise program that is approved by your health care provider.  Maintain a healthy weight.  Try to reduce or manage your stress, such as with yoga or meditation. If you need help reducing stress, ask your health care provider.  Do not use use any products that contain nicotine or tobacco, such as cigarettes and e-cigarettes. If you need help quitting, ask  your health care provider.  Do not use illegal drugs.  Limit alcohol intake to no more than 1 drink per day for nonpregnant women and 2 drinks per day for men. One drink equals 12 oz of beer, 5 oz of wine, or 1 oz of hard liquor. General instructions   Take over-the-counter and prescription medicines only as told by your health care provider.  Keep all follow-up visits as directed by your health care provider. This is  important. How is this prevented? In some cases, bradycardia may be prevented by:  Treating underlying medical problems.  Stopping behaviors or medicines that can trigger the condition. Contact a health care provider if:  You feel light-headed or dizzy.  You almost faint.  You feel weak or are easily fatigued during physical activity.  You experience confusion or have memory problems. Get help right away if:  You faint.  You have an irregular heartbeat (palpitations).  You have chest pain.  You have trouble breathing. This information is not intended to replace advice given to you by your health care provider. Make sure you discuss any questions you have with your health care provider. Document Released: 11/03/2001 Document Revised: 10/10/2015 Document Reviewed: 08/03/2015 Elsevier Interactive Patient Education  2017 [employer].  IF you received an x-ray today, you will receive an invoice from [facility]. Please contact [facility] at [phone] with questions or concerns regarding your invoice.   IF you received labwork today, you will receive an invoice from [place]. Please contact [facility] at [phone] with questions or concerns regarding your invoice.   Our billing staff will not be able to assist you with questions regarding bills from these companies.  You will be contacted with the lab results as soon as they are available. The fastest way to get your results is to activate your My Chart account. Instructions are located on the last page of this paperwork. If you have not heard from [place] regarding the results in 2 weeks, please contact this office.       I personally performed the services described in this documentation, which was scribed in my presence. The recorded information has been reviewed and considered for accuracy and completeness, addended by me as needed, and agree with information above.  Signed,   [name], MD Primary  Care at [place].  05/25/16 2:04 PM

## 2016-05-25 NOTE — Patient Instructions (Addendum)
Blood pressure running a little high. But heart rate is too low. Atenolol likely will make that worse. I changed you to amlodipine 5 mg once per day, continue chlorthalidone 25 mg once per day. Recheck with me in the next 2 weeks, but keep a record of your blood pressures outside of the office for that visit. If running over 160/100 recheck sooner. Additionally with low heart rate and borderline abnormal EKG, I would like you to meet with a cardiologist to see if other testing is needed prior to your surgery. I placed that referral.    Bradycardia, Adult Bradycardia is a slower-than-normal heartbeat. A normal resting heart rate for an adult ranges from 60 to 100 beats per minute. With bradycardia, the resting heart rate is less than 60 beats per minute. Bradycardia can prevent enough oxygen from reaching certain areas of your body when you are active. It can be serious if it keeps enough oxygen from reaching your brain and other parts of your body. Bradycardia is not a problem for everyone. For some healthy adults, a slow resting heart rate is normal. What are the causes? This condition may be caused by:  A problem with the heart, including:  A problem with the heart's electrical system, such as a heart block.  A problem with the heart's natural pacemaker (sinus node).  Heart disease.  A heart attack.  Heart damage.  A heart infection.  A heart condition that is present at birth (congenital heart defect).  Certain medicines that treat heart conditions.  Certain conditions, such as hypothyroidism and obstructive sleep apnea.  Problems with the balance of chemicals and other substances, like potassium, in the blood. What increases the risk? This condition is more likely to develop in adults who:  Are age 61 or older.  Have high blood pressure (hypertension), high cholesterol (hyperlipidemia), or diabetes.  Drink heavily, use tobacco or nicotine products, or use drugs.  Are  stressed. What are the signs or symptoms? Symptoms of this condition include:  Light-headedness.  Feeling faint or fainting.  Fatigue and weakness.  Shortness of breath.  Chest pain (angina).  Drowsiness.  Confusion.  Dizziness. How is this diagnosed? This condition may be diagnosed based on:  Your symptoms.  Your medical history.  A physical exam. During the exam, your health care provider will listen to your heartbeat and check your pulse. To confirm the diagnosis, your health care provider may order tests, such as:  Blood tests.  An electrocardiogram (ECG). This test records the heart's electrical activity. The test can show how fast your heart is beating and whether the heartbeat is steady.  A test in which you wear a portable device (event recorder or Holter monitor) to record your heart's electrical activity while you go about your day.  Anexercise test. How is this treated? Treatment for this condition depends on the cause of the condition and how severe your symptoms are. Treatment may involve:  Treatment of the underlying condition.  Changing your medicines or how much medicine you take.  Having a small, battery-operated device called a pacemaker implanted under the skin. When bradycardia occurs, this device can be used to increase your heart rate and help your heart to beat in a regular rhythm. Follow these instructions at home: Lifestyle    Manage any health conditions that contribute to bradycardia as told by your health care provider.  Follow a heart-healthy diet. A nutrition specialist (dietitian) can help to educate you about healthy food options and  changes.  Follow an exercise program that is approved by your health care provider.  Maintain a healthy weight.  Try to reduce or manage your stress, such as with yoga or meditation. If you need help reducing stress, ask your health care provider.  Do not use use any products that contain nicotine  or tobacco, such as cigarettes and e-cigarettes. If you need help quitting, ask your health care provider.  Do not use illegal drugs.  Limit alcohol intake to no more than 1 drink per day for nonpregnant women and 2 drinks per day for men. One drink equals 12 oz of beer, 5 oz of wine, or 1 oz of hard liquor. General instructions   Take over-the-counter and prescription medicines only as told by your health care provider.  Keep all follow-up visits as directed by your health care provider. This is important. How is this prevented? In some cases, bradycardia may be prevented by:  Treating underlying medical problems.  Stopping behaviors or medicines that can trigger the condition. Contact a health care provider if:  You feel light-headed or dizzy.  You almost faint.  You feel weak or are easily fatigued during physical activity.  You experience confusion or have memory problems. Get help right away if:  You faint.  You have an irregular heartbeat (palpitations).  You have chest pain.  You have trouble breathing. This information is not intended to replace advice given to you by your health care provider. Make sure you discuss any questions you have with your health care provider. Document Released: 11/03/2001 Document Revised: 10/10/2015 Document Reviewed: 08/03/2015 Elsevier Interactive Patient Education  2017 [employer].  IF you received an x-ray today, you will receive an invoice from [facility]. Please contact [facility] at [phone] with questions or concerns regarding your invoice.   IF you received labwork today, you will receive an invoice from [place]. Please contact [facility] at [phone] with questions or concerns regarding your invoice.   Our billing staff will not be able to assist you with questions regarding bills from these companies.  You will be contacted with the lab results as soon as they are available. The fastest way to  get your results is to activate your My Chart account. Instructions are located on the last page of this paperwork. If you have not heard from [place] regarding the results in 2 weeks, please contact this office.

## 2016-06-13 ENCOUNTER — Ambulatory Visit (INDEPENDENT_AMBULATORY_CARE_PROVIDER_SITE_OTHER): Payer: Medicare Other | Admitting: Family Medicine

## 2016-06-13 ENCOUNTER — Encounter: Payer: Self-pay | Admitting: Family Medicine

## 2016-06-13 VITALS — BP 139/74 | HR 52 | Temp 97.7°F | Resp 16 | Ht 64.96 in | Wt 169.0 lb

## 2016-06-13 DIAGNOSIS — I10 Essential (primary) hypertension: Secondary | ICD-10-CM | POA: Diagnosis not present

## 2016-06-13 DIAGNOSIS — R21 Rash and other nonspecific skin eruption: Secondary | ICD-10-CM | POA: Diagnosis not present

## 2016-06-13 DIAGNOSIS — R001 Bradycardia, unspecified: Secondary | ICD-10-CM

## 2016-06-13 MED ORDER — METRONIDAZOLE 0.75 % EX GEL: 1.0000 "application " | Freq: Two times a day (BID) | CUTANEOUS | 0 refills | Status: DC

## 2016-06-13 MED ORDER — HYDROCORTISONE 1 % EX LOTN: 1.0000 "application " | TOPICAL_LOTION | Freq: Two times a day (BID) | CUTANEOUS | 0 refills | Status: DC

## 2016-06-13 MED ORDER — CHLORTHALIDONE 25 MG PO TABS: 25.0000 mg | ORAL_TABLET | Freq: Every day | ORAL | 3 refills | Status: DC

## 2016-06-13 NOTE — Patient Instructions (Addendum)
Your face rash may be either eczema or rosacea. I will refer you to dermatologist, but can treat for both possibilities at this time. Start metronidazole gel, twice per day. Hydrocortisone cream if needed for itching or irritation up to twice per day. Stop using Dial soap, use Dove instead. See other information on eczema and rosacea as typed below.  Your low heart rate is likely the cause of light headedness and woozy feeling. We need to stop the atenolol. Do not use the combination pill any further at this time. I sent a prescription to your pharmacy for the chlorthalidone by itself, but continue amlodipine 5 mg once per day. Check your blood pressures once per day and if blood pressures go over 140/90, increase the amlodipine to 2 pills per day or a total of 10 mg each day. Follow-up with me in 3-4 weeks, sooner if worse. Let me know if you have questions.  Call [facility] to see if they have been trying to reach you for an appointment: [phone]    Eczema Eczema, also called atopic dermatitis, is a skin disorder that causes inflammation of the skin. It causes a red rash and dry, scaly skin. The skin becomes very itchy. Eczema is generally worse during the cooler winter months and often improves with the warmth of summer. Eczema usually starts showing signs in infancy. Some children outgrow eczema, but it may last through adulthood. What are the causes? The exact cause of eczema is not known, but it appears to run in families. People with eczema often have a family history of eczema, allergies, asthma, or hay fever. Eczema is not contagious. Flare-ups of the condition may be caused by:  Contact with something you are sensitive or allergic to.  Stress. What are the signs or symptoms?  Dry, scaly skin.  Red, itchy rash.  Itchiness. This may occur before the skin rash and may be very intense. How is this diagnosed? The diagnosis of eczema is usually made based on symptoms and  medical history. How is this treated? Eczema cannot be cured, but symptoms usually can be controlled with treatment and other strategies. A treatment plan might include:  Controlling the itching and scratching.  Use over-the-counter antihistamines as directed for itching. This is especially useful at night when the itching tends to be worse.  Use over-the-counter steroid creams as directed for itching.  Avoid scratching. Scratching makes the rash and itching worse. It may also result in a skin infection (impetigo) due to a break in the skin caused by scratching.  Keeping the skin well moisturized with creams every day. This will seal in moisture and help prevent dryness. Lotions that contain alcohol and water should be avoided because they can dry the skin.  Limiting exposure to things that you are sensitive or allergic to (allergens).  Recognizing situations that cause stress.  Developing a plan to manage stress. Follow these instructions at home:  Only take over-the-counter or prescription medicines as directed by your health care provider.  Do not use anything on the skin without checking with your health care provider.  Keep baths or showers short (5 minutes) in warm (not hot) water. Use mild cleansers for bathing. These should be unscented. You may add nonperfumed bath oil to the bath water. It is best to avoid soap and bubble bath.  Immediately after a bath or shower, when the skin is still damp, apply a moisturizing ointment to the entire body. This ointment should be a petroleum ointment. This  will seal in moisture and help prevent dryness. The thicker the ointment, the better. These should be unscented.  Keep fingernails cut short. Children with eczema may need to wear soft gloves or mittens at night after applying an ointment.  Dress in clothes made of cotton or cotton blends. Dress lightly, because heat increases itching.  A child with eczema should stay away from anyone  with fever blisters or cold sores. The virus that causes fever blisters (herpes simplex) can cause a serious skin infection in children with eczema. Contact a health care provider if:  Your itching interferes with sleep.  Your rash gets worse or is not better within 1 week after starting treatment.  You see pus or soft yellow scabs in the rash area.  You have a fever.  You have a rash flare-up after contact with someone who has fever blisters. This information is not intended to replace advice given to you by your health care provider. Make sure you discuss any questions you have with your health care provider. Document Released: 02/09/2000 Document Revised: 07/20/2015 Document Reviewed: 09/14/2012 Elsevier Interactive Patient Education  2017 [employer].  Rosacea Rosacea is a long-term (chronic) condition that affects the skin of the face, including the cheeks, nose, brow, and chin. This condition can also affect the eyes. Rosacea causes blood vessels near the surface of the skin to enlarge, which results in redness. What are the causes? The cause of this condition is not known. Certain triggers can make rosacea worse, including:  Hot baths.  Exercise.  Sunlight.  Very hot or cold temperatures.  Hot or spicy foods and drinks.  Drinking alcohol.  Stress.  Taking blood pressure medicine.  Long-term use of topical steroids on the face. What increases the risk? This condition is more likely to develop in:  People who are older than 80 years of age.  Women.  People who have light-colored skin (light complexion).  People who have a family history of rosacea. What are the signs or symptoms? Symptoms of this condition include:  Redness of the face.  Red bumps or pimples on the face.  A red, enlarged nose.  Blushing easily.  Red lines on the skin.  Irritated or burning feeling in the eyes.  Swollen eyelids. How is this diagnosed? This condition is diagnosed  with a medical history and physical exam. How is this treated? There is no cure for this condition, but treatment can help to control your symptoms. Your health care provider may recommend that you see a skin specialist (dermatologist). Treatment may include:  Antibiotic medicines that are applied to the skin or taken as a pill.  Laser treatment to improve the appearance of the skin.  Surgery. This is rare. Your health care provider will also recommend the best way to take care of your skin. Even after your skin improves, you will likely need to continue treatment to prevent your rosacea from coming back. Follow these instructions at home: Skin Care  Take care of your skin as told by your health care provider. You may be told to do these things:  Wash your skin gently two or more times each day.  Use mild soap.  Use a sunscreen or sunblock with SPF 30 or greater.  Use gentle cosmetics that are meant for sensitive skin.  Shave with an electric shaver instead of a blade. Lifestyle   Try to keep track of what foods trigger this condition. Avoid any triggers. These may include:  Spicy foods.  Seafood.  Cheese.  Hot liquids.  Nuts.  Chocolate.  Iodized salt.  Do not drink alcohol.  Avoid extremely cold or hot temperatures.  Try to reduce your stress. If you need help, talk with your health care provider.  When you exercise, do these things to stay cool:  Limit your sun exposure.  Use a fan.  Do shorter and more frequent intervals of exercise. General instructions   Keep all follow-up visits as told by your health care provider. This is important.  Take over-the-counter and prescription medicines only as told by your health care provider.  If your eyelids are affected, apply warm compresses to them. Do this as told by your health care provider.  If you were prescribed an antibiotic medicine, apply or take it as told by your health care provider. Do not stop  using the antibiotic even if your condition improves. Contact a health care provider if:  Your symptoms get worse.  Your symptoms do not improve after two months of treatment.  You have new symptoms.  You have any changes in vision or you have problems with your eyes, such as redness or itching.  You feel depressed.  You lose your appetite.  You have trouble concentrating. This information is not intended to replace advice given to you by your health care provider. Make sure you discuss any questions you have with your health care provider. Document Released: 03/21/2004 Document Revised: 07/20/2015 Document Reviewed: 04/20/2014 Elsevier Interactive Patient Education  2017 [employer]  Bradycardia, Adult Bradycardia is a slower-than-normal heartbeat. A normal resting heart rate for an adult ranges from 60 to 100 beats per minute. With bradycardia, the resting heart rate is less than 60 beats per minute. Bradycardia can prevent enough oxygen from reaching certain areas of your body when you are active. It can be serious if it keeps enough oxygen from reaching your brain and other parts of your body. Bradycardia is not a problem for everyone. For some healthy adults, a slow resting heart rate is normal. What are the causes? This condition may be caused by:  A problem with the heart, including:  A problem with the heart's electrical system, such as a heart block.  A problem with the heart's natural pacemaker (sinus node).  Heart disease.  A heart attack.  Heart damage.  A heart infection.  A heart condition that is present at birth (congenital heart defect).  Certain medicines that treat heart conditions.  Certain conditions, such as hypothyroidism and obstructive sleep apnea.  Problems with the balance of chemicals and other substances, like potassium, in the blood. What increases the risk? This condition is more likely to develop in adults who:  Are age 77 or  older.  Have high blood pressure (hypertension), high cholesterol (hyperlipidemia), or diabetes.  Drink heavily, use tobacco or nicotine products, or use drugs.  Are stressed. What are the signs or symptoms? Symptoms of this condition include:  Light-headedness.  Feeling faint or fainting.  Fatigue and weakness.  Shortness of breath.  Chest pain (angina).  Drowsiness.  Confusion.  Dizziness. How is this diagnosed? This condition may be diagnosed based on:  Your symptoms.  Your medical history.  A physical exam. During the exam, your health care provider will listen to your heartbeat and check your pulse. To confirm the diagnosis, your health care provider may order tests, such as:  Blood tests.  An electrocardiogram (ECG). This test records the heart's electrical activity. The test can show how fast your  heart is beating and whether the heartbeat is steady.  A test in which you wear a portable device (event recorder or Holter monitor) to record your heart's electrical activity while you go about your day.  Anexercise test. How is this treated? Treatment for this condition depends on the cause of the condition and how severe your symptoms are. Treatment may involve:  Treatment of the underlying condition.  Changing your medicines or how much medicine you take.  Having a small, battery-operated device called a pacemaker implanted under the skin. When bradycardia occurs, this device can be used to increase your heart rate and help your heart to beat in a regular rhythm. Follow these instructions at home: Lifestyle    Manage any health conditions that contribute to bradycardia as told by your health care provider.  Follow a heart-healthy diet. A nutrition specialist (dietitian) can help to educate you about healthy food options and changes.  Follow an exercise program that is approved by your health care provider.  Maintain a healthy weight.  Try to reduce  or manage your stress, such as with yoga or meditation. If you need help reducing stress, ask your health care provider.  Do not use use any products that contain nicotine or tobacco, such as cigarettes and e-cigarettes. If you need help quitting, ask your health care provider.  Do not use illegal drugs.  Limit alcohol intake to no more than 1 drink per day for nonpregnant women and 2 drinks per day for men. One drink equals 12 oz of beer, 5 oz of wine, or 1 oz of hard liquor. General instructions   Take over-the-counter and prescription medicines only as told by your health care provider.  Keep all follow-up visits as directed by your health care provider. This is important. How is this prevented? In some cases, bradycardia may be prevented by:  Treating underlying medical problems.  Stopping behaviors or medicines that can trigger the condition. Contact a health care provider if:  You feel light-headed or dizzy.  You almost faint.  You feel weak or are easily fatigued during physical activity.  You experience confusion or have memory problems. Get help right away if:  You faint.  You have an irregular heartbeat (palpitations).  You have chest pain.  You have trouble breathing. This information is not intended to replace advice given to you by your health care provider. Make sure you discuss any questions you have with your health care provider. Document Released: 11/03/2001 Document Revised: 10/10/2015 Document Reviewed: 08/03/2015 Elsevier Interactive Patient Education  2017 [employer].   IF you received an x-ray today, you will receive an invoice from [facility]. Please contact [facility] at [phone] with questions or concerns regarding your invoice.   IF you received labwork today, you will receive an invoice from [facility]. Please contact [facility] at [phone] with questions or concerns regarding your invoice.   Our billing staff will  not be able to assist you with questions regarding bills from these companies.  You will be contacted with the lab results as soon as they are available. The fastest way to get your results is to activate your My Chart account. Instructions are located on the last page of this paperwork. If you have not heard from [place] regarding the results in 2 weeks, please contact this office.

## 2016-06-13 NOTE — Progress Notes (Signed)
By signing my name below, I, Molly Lambert, attest that this documentation has been prepared under the direction and in the presence of [name], MD.  Electronically Signed: [name], Medical Scribe. 06/13/16. 11:14 AM.  Subjective:    Patient ID: Molly Lambert, female    DOB: 1935/02/14, 82 y.o.   MRN: [id number]  HPI Chief Complaint  Patient presents with  . Follow-up    2 week follow up     HPI Comments: Molly Lambert is a 82 y.o. female who presents to the Primary Care at [facility] for follow-up. She was last seen 05/25/16 for a pre-op exam, at that time her bp was elevated. She was having some fatigue and light-headedness. Bradycardic with rate of 46 on her EKG, and a nl TSH. We stopped her atenolol, changed to amlodipine 5 mg QD, continued chlorthalidone 25 mg QD. She was also referred to cardiology to determine other tested needed for surgical clearance. She was referred to [facility].  Reports experiencing some light-headedness once in a while and fatigue, but suspects she "messed up" her medication. Pt is still taking atenolol as it is a combination pill with chlorthalidone, as well as amlodipine. Pt does not have a bp cuff at home, but has access to a close drug store to check her bp. Denies experiencing difficulty breathing, chest pain, trouble swallowing, or other negative side effects from amlodipine.  Rash: Reports worsening erythematous rash on her face onset 11/2014 with unknown triggers. She describes the rash as a sunburn or "rawhide" with flaking and reports feeling stinging, and burning from the concerned areas. Pt uses soap (Dial antibacterial) and water to wash her face. Her dermatologist, Dr. Denna Haggard dx her with eczema or rosacea and rx'ed metrogel. Pt has not found relief of her sxs with usage.  Patient Active Problem List   Diagnosis Date Noted  . Breast microcalcifications 12/27/2011  . History of breast cancer 12/27/2011  .  Hypothyroidism 08/25/2011  . Hypercholesteremia 08/25/2011  . HTN (hypertension) 08/25/2011  . Malignant neoplasm of central portion of right breast in female, estrogen receptor positive ([place]) 02/27/2011  . Breast mass, right 11/28/2010   Past Medical History:  Diagnosis Date  . Arthritis   . Breast cancer ([place]) 11/2010   Rt breast  . Cataract   . Hypercholesteremia   . Hypertension    NO CARDIAC MD,  [facility]  PCP  ,  URGENT MED  . Leg laceration    WITH SURGERY  . S/P radiation therapy 03/14/11 - 04/05/11   Right Breast / 4256 cGy in 16 Fractions  . Thyroid disease    Past Surgical History:  Procedure Laterality Date  . BREAST LUMPECTOMY  01/22/11   RIGHT BREAST LUMPECTOMY WITH BIOSPY OF 1 SENTINEL NODE, INVASIVE GRADE II DUCTAL CARCINOMA , INTERMEDIATE GRADE DUCTAL CARCINOMA IN SITU , DERMAL SKIN INVOLVED, MARGINS NEGATIVE,( 0/1)  NODE POSITIVE, ER+, PR+, LOW s-PHASE, HER 2 NEU- NO AMPLIFICATION  . BREAST SURGERY  11/28/10   SKIN-PUNCH BIOSPY RIGHT BREAST(NIPPLE), ER+, RP+, LOW S-PHASE, HER 2NEU NEGAITIVE  . EYE SURGERY      BILATERAL CATARACT SURGERY  . PARTIAL MASTECTOMY WITH NEEDLE LOCALIZATION  01/10/2012   Procedure: PARTIAL MASTECTOMY WITH NEEDLE LOCALIZATION;  Surgeon: Marcello Moores A. Cornett, MD;  Location: [place];  Service: General;  Laterality: Right;  right breast needle localized partial mastectomy  . TUBAL LIGATION  1972   No Known Allergies Prior to Admission medications  Medication Sig Start Date End Date Taking? Authorizing Provider  amLODipine (NORVASC) 5 MG tablet Take 1 tablet (5 mg total) by mouth daily. 05/25/16  Yes Wendie Agreste, MD  aspirin EC 81 MG tablet Take 81 mg by mouth daily.     Yes Historical Provider, MD  chlorthalidone (HYGROTON) 25 MG tablet Take 1 tablet (25 mg total) by mouth daily. 05/25/16  Yes Wendie Agreste, MD  cholecalciferol (VITAMIN D) 1000 units tablet Take 1,000 Units by mouth daily.   Yes Historical Provider, MD   ipratropium (ATROVENT) 0.03 % nasal spray Place 2 sprays into both nostrils 2 (two) times daily. As needed 02/29/16  Yes Wendie Agreste, MD  levothyroxine (SYNTHROID, LEVOTHROID) 50 MCG tablet Take 1 tablet (50 mcg total) by mouth daily before breakfast. 02/29/16  Yes Wendie Agreste, MD  simvastatin (ZOCOR) 20 MG tablet Take 1 tablet (20 mg total) by mouth daily. 02/29/16  Yes Wendie Agreste, MD  tetracycline (ACHROMYCIN,SUMYCIN) 250 MG capsule Take 1 capsule (250 mg total) by mouth daily. Patient not taking: Reported on 05/25/2016 04/16/16   Chauncey Cruel, MD   Social History   Social History  . Marital status: Married    Spouse name: N/A  . Number of children: 3  . Years of education: 12   Occupational History  . Waitress     [employer]   Social History Main Topics  . Smoking status: Never Smoker  . Smokeless tobacco: Never Used  . Alcohol use No     Comment: Rarely/On Special Occasions  . Drug use: No  . Sexual activity: No     Comment: G3, P3, MENARCHE, AGE 24, MENOPAUSE AGE 22, NO BC AND HRT X 10 YEARS   Other Topics Concern  . Not on file   Social History Narrative   Married for 56 years      3 children, one deceased   70 grand-children, 3 great-grandchildren   Review of Systems  Constitutional: Positive for fatigue.  HENT: Negative for trouble swallowing.   Respiratory: Negative for shortness of breath, wheezing and stridor.   Cardiovascular: Negative for chest pain.  Skin: Positive for color change and rash.  Neurological: Positive for light-headedness.   Objective:  Physical Exam  Constitutional: She appears well-developed and well-nourished. No distress.  HENT:  Head: Normocephalic and atraumatic.  Eyes: Conjunctivae are normal.  Neck: Neck supple.  Cardiovascular: Normal rate and regular rhythm.  Exam reveals no gallop and no friction rub.   No murmur heard. Rhythm regular but slow  Pulmonary/Chest: Effort normal and breath sounds  normal. No respiratory distress. She has no wheezes. She has no rales.  Musculoskeletal: She exhibits edema.  Trace non pitting edema at the ankles  Neurological: She is alert.  Skin: Skin is warm and dry.  Erythema from the infraorbital area down to the malar prominences to above the upper lip and lateral to the mouth There are some dry areas with some slight splitting of the skin at the nasolabial fold No vesiciles No apparent telangiectasias folds  Psychiatric: She has a normal mood and affect. Her behavior is normal.  Nursing note and vitals reviewed.   Vitals:   06/13/16 1023  BP: 139/74  Pulse: (!) 52  Resp: 16  Temp: 97.7 F (36.5 C)  TempSrc: Oral  SpO2: 95%  Weight: 169 lb (76.7 kg)  Height: 5' 4.96" (1.65 m)  Body mass index is 28.16 kg/m. Assessment & Plan:  Raelene Trew Depaolis is a 82 y.o. female Rash of face - Plan: metroNIDAZOLE (METROGEL) 0.75 % gel, hydrocortisone 1 % lotion, Ambulatory referral to Dermatology  -Atopic dermatitis versus rosacea. Trial of MetroGel, hydrocortisone lotion to treat both anterior, areas of irritation down. Also advised to stop Dial soap and switch to milder soap such as Dove.   -Refer to dermatology. RTC precautions if worsening sooner  Essential hypertension - Plan: chlorthalidone (HYGROTON) 25 MG tablet Bradycardia  -BP borderline, but overall controlled. Persistent bradycardia, but some confusion regarding change in meds from last visit.  - Stop atenolol, continue chlorthalidone, continue amlodipine 5 mg daily. If blood pressures remain over 140/90, increase amlodipine to 10 mg daily. Suspect this will improve the fatigue/light headedness, but recheck in 3-4 weeks. Sooner or to ER if worse.   -Advised to call [facility] as should be scheduled for appointment based on last office visit referral.  Meds ordered this encounter  Medications  . chlorthalidone (HYGROTON) 25 MG tablet    Sig: Take 1 tablet (25 mg total) by mouth  daily.    Dispense:  30 tablet    Refill:  3  . metroNIDAZOLE (METROGEL) 0.75 % gel    Sig: Apply 1 application topically 2 (two) times daily.    Dispense:  45 g    Refill:  0  . hydrocortisone 1 % lotion    Sig: Apply 1 application topically 2 (two) times daily.    Dispense:  118 mL    Refill:  0   Patient Instructions    Your face rash may be either eczema or rosacea. I will refer you to dermatologist, but can treat for both possibilities at this time. Start metronidazole gel, twice per day. Hydrocortisone cream if needed for itching or irritation up to twice per day. Stop using Dial soap, use Dove instead. See other information on eczema and rosacea as typed below.  Your low heart rate is likely the cause of light headedness and woozy feeling. We need to stop the atenolol. Do not use the combination pill any further at this time. I sent a prescription to your pharmacy for the chlorthalidone by itself, but continue amlodipine 5 mg once per day. Check your blood pressures once per day and if blood pressures go over 140/90, increase the amlodipine to 2 pills per day or a total of 10 mg each day. Follow-up with me in 3-4 weeks, sooner if worse. Let me know if you have questions.  Call [facility] to see if they have been trying to reach you for an appointment: [phone]    Eczema Eczema, also called atopic dermatitis, is a skin disorder that causes inflammation of the skin. It causes a red rash and dry, scaly skin. The skin becomes very itchy. Eczema is generally worse during the cooler winter months and often improves with the warmth of summer. Eczema usually starts showing signs in infancy. Some children outgrow eczema, but it may last through adulthood. What are the causes? The exact cause of eczema is not known, but it appears to run in families. People with eczema often have a family history of eczema, allergies, asthma, or hay fever. Eczema is not contagious. Flare-ups of the  condition may be caused by:  Contact with something you are sensitive or allergic to.  Stress. What are the signs or symptoms?  Dry, scaly skin.  Red, itchy rash.  Itchiness. This may occur before the skin rash and may be very intense. How  is this diagnosed? The diagnosis of eczema is usually made based on symptoms and medical history. How is this treated? Eczema cannot be cured, but symptoms usually can be controlled with treatment and other strategies. A treatment plan might include:  Controlling the itching and scratching.  Use over-the-counter antihistamines as directed for itching. This is especially useful at night when the itching tends to be worse.  Use over-the-counter steroid creams as directed for itching.  Avoid scratching. Scratching makes the rash and itching worse. It may also result in a skin infection (impetigo) due to a break in the skin caused by scratching.  Keeping the skin well moisturized with creams every day. This will seal in moisture and help prevent dryness. Lotions that contain alcohol and water should be avoided because they can dry the skin.  Limiting exposure to things that you are sensitive or allergic to (allergens).  Recognizing situations that cause stress.  Developing a plan to manage stress. Follow these instructions at home:  Only take over-the-counter or prescription medicines as directed by your health care provider.  Do not use anything on the skin without checking with your health care provider.  Keep baths or showers short (5 minutes) in warm (not hot) water. Use mild cleansers for bathing. These should be unscented. You may add nonperfumed bath oil to the bath water. It is best to avoid soap and bubble bath.  Immediately after a bath or shower, when the skin is still damp, apply a moisturizing ointment to the entire body. This ointment should be a petroleum ointment. This will seal in moisture and help prevent dryness. The thicker  the ointment, the better. These should be unscented.  Keep fingernails cut short. Children with eczema may need to wear soft gloves or mittens at night after applying an ointment.  Dress in clothes made of cotton or cotton blends. Dress lightly, because heat increases itching.  A child with eczema should stay away from anyone with fever blisters or cold sores. The virus that causes fever blisters (herpes simplex) can cause a serious skin infection in children with eczema. Contact a health care provider if:  Your itching interferes with sleep.  Your rash gets worse or is not better within 1 week after starting treatment.  You see pus or soft yellow scabs in the rash area.  You have a fever.  You have a rash flare-up after contact with someone who has fever blisters. This information is not intended to replace advice given to you by your health care provider. Make sure you discuss any questions you have with your health care provider. Document Released: 02/09/2000 Document Revised: 07/20/2015 Document Reviewed: 09/14/2012 Elsevier Interactive Patient Education  2017 [employer].  Rosacea Rosacea is a long-term (chronic) condition that affects the skin of the face, including the cheeks, nose, brow, and chin. This condition can also affect the eyes. Rosacea causes blood vessels near the surface of the skin to enlarge, which results in redness. What are the causes? The cause of this condition is not known. Certain triggers can make rosacea worse, including:  Hot baths.  Exercise.  Sunlight.  Very hot or cold temperatures.  Hot or spicy foods and drinks.  Drinking alcohol.  Stress.  Taking blood pressure medicine.  Long-term use of topical steroids on the face. What increases the risk? This condition is more likely to develop in:  People who are older than 80 years of age.  Women.  People who have light-colored skin (light complexion).  People who have a family  history of rosacea. What are the signs or symptoms? Symptoms of this condition include:  Redness of the face.  Red bumps or pimples on the face.  A red, enlarged nose.  Blushing easily.  Red lines on the skin.  Irritated or burning feeling in the eyes.  Swollen eyelids. How is this diagnosed? This condition is diagnosed with a medical history and physical exam. How is this treated? There is no cure for this condition, but treatment can help to control your symptoms. Your health care provider may recommend that you see a skin specialist (dermatologist). Treatment may include:  Antibiotic medicines that are applied to the skin or taken as a pill.  Laser treatment to improve the appearance of the skin.  Surgery. This is rare. Your health care provider will also recommend the best way to take care of your skin. Even after your skin improves, you will likely need to continue treatment to prevent your rosacea from coming back. Follow these instructions at home: Skin Care  Take care of your skin as told by your health care provider. You may be told to do these things:  Wash your skin gently two or more times each day.  Use mild soap.  Use a sunscreen or sunblock with SPF 30 or greater.  Use gentle cosmetics that are meant for sensitive skin.  Shave with an electric shaver instead of a blade. Lifestyle   Try to keep track of what foods trigger this condition. Avoid any triggers. These may include:  Spicy foods.  Seafood.  Cheese.  Hot liquids.  Nuts.  Chocolate.  Iodized salt.  Do not drink alcohol.  Avoid extremely cold or hot temperatures.  Try to reduce your stress. If you need help, talk with your health care provider.  When you exercise, do these things to stay cool:  Limit your sun exposure.  Use a fan.  Do shorter and more frequent intervals of exercise. General instructions   Keep all follow-up visits as told by your health care provider. This  is important.  Take over-the-counter and prescription medicines only as told by your health care provider.  If your eyelids are affected, apply warm compresses to them. Do this as told by your health care provider.  If you were prescribed an antibiotic medicine, apply or take it as told by your health care provider. Do not stop using the antibiotic even if your condition improves. Contact a health care provider if:  Your symptoms get worse.  Your symptoms do not improve after two months of treatment.  You have new symptoms.  You have any changes in vision or you have problems with your eyes, such as redness or itching.  You feel depressed.  You lose your appetite.  You have trouble concentrating. This information is not intended to replace advice given to you by your health care provider. Make sure you discuss any questions you have with your health care provider. Document Released: 03/21/2004 Document Revised: 07/20/2015 Document Reviewed: 04/20/2014 Elsevier Interactive Patient Education  2017 [employer] Inc.  Bradycardia, Adult Bradycardia is a slower-than-normal heartbeat. A normal resting heart rate for an adult ranges from 60 to 100 beats per minute. With bradycardia, the resting heart rate is less than 60 beats per minute. Bradycardia can prevent enough oxygen from reaching certain areas of your body when you are active. It can be serious if it keeps enough oxygen from reaching your brain and other parts of your body. Bradycardia  is not a problem for everyone. For some healthy adults, a slow resting heart rate is normal. What are the causes? This condition may be caused by:  A problem with the heart, including:  A problem with the heart's electrical system, such as a heart block.  A problem with the heart's natural pacemaker (sinus node).  Heart disease.  A heart attack.  Heart damage.  A heart infection.  A heart condition that is present at birth (congenital  heart defect).  Certain medicines that treat heart conditions.  Certain conditions, such as hypothyroidism and obstructive sleep apnea.  Problems with the balance of chemicals and other substances, like potassium, in the blood. What increases the risk? This condition is more likely to develop in adults who:  Are age 19 or older.  Have high blood pressure (hypertension), high cholesterol (hyperlipidemia), or diabetes.  Drink heavily, use tobacco or nicotine products, or use drugs.  Are stressed. What are the signs or symptoms? Symptoms of this condition include:  Light-headedness.  Feeling faint or fainting.  Fatigue and weakness.  Shortness of breath.  Chest pain (angina).  Drowsiness.  Confusion.  Dizziness. How is this diagnosed? This condition may be diagnosed based on:  Your symptoms.  Your medical history.  A physical exam. During the exam, your health care provider will listen to your heartbeat and check your pulse. To confirm the diagnosis, your health care provider may order tests, such as:  Blood tests.  An electrocardiogram (ECG). This test records the heart's electrical activity. The test can show how fast your heart is beating and whether the heartbeat is steady.  A test in which you wear a portable device (event recorder or Holter monitor) to record your heart's electrical activity while you go about your day.  Anexercise test. How is this treated? Treatment for this condition depends on the cause of the condition and how severe your symptoms are. Treatment may involve:  Treatment of the underlying condition.  Changing your medicines or how much medicine you take.  Having a small, battery-operated device called a pacemaker implanted under the skin. When bradycardia occurs, this device can be used to increase your heart rate and help your heart to beat in a regular rhythm. Follow these instructions at home: Lifestyle    Manage any health  conditions that contribute to bradycardia as told by your health care provider.  Follow a heart-healthy diet. A nutrition specialist (dietitian) can help to educate you about healthy food options and changes.  Follow an exercise program that is approved by your health care provider.  Maintain a healthy weight.  Try to reduce or manage your stress, such as with yoga or meditation. If you need help reducing stress, ask your health care provider.  Do not use use any products that contain nicotine or tobacco, such as cigarettes and e-cigarettes. If you need help quitting, ask your health care provider.  Do not use illegal drugs.  Limit alcohol intake to no more than 1 drink per day for nonpregnant women and 2 drinks per day for men. One drink equals 12 oz of beer, 5 oz of wine, or 1 oz of hard liquor. General instructions   Take over-the-counter and prescription medicines only as told by your health care provider.  Keep all follow-up visits as directed by your health care provider. This is important. How is this prevented? In some cases, bradycardia may be prevented by:  Treating underlying medical problems.  Stopping behaviors or medicines that can  trigger the condition. Contact a health care provider if:  You feel light-headed or dizzy.  You almost faint.  You feel weak or are easily fatigued during physical activity.  You experience confusion or have memory problems. Get help right away if:  You faint.  You have an irregular heartbeat (palpitations).  You have chest pain.  You have trouble breathing. This information is not intended to replace advice given to you by your health care provider. Make sure you discuss any questions you have with your health care provider. Document Released: 11/03/2001 Document Revised: 10/10/2015 Document Reviewed: 08/03/2015 Elsevier Interactive Patient Education  2017 [employer].   IF you received an x-ray today, you will receive an  invoice from [facility]. Please contact [facility] at [phone] with questions or concerns regarding your invoice.   IF you received labwork today, you will receive an invoice from [facility]. Please contact [facility] at [phone] with questions or concerns regarding your invoice.   Our billing staff will not be able to assist you with questions regarding bills from these companies.  You will be contacted with the lab results as soon as they are available. The fastest way to get your results is to activate your My Chart account. Instructions are located on the last page of this paperwork. If you have not heard from [place] regarding the results in 2 weeks, please contact this office.       I personally performed the services described in this documentation, which was scribed in my presence. The recorded information has been reviewed and considered for accuracy and completeness, addended by me as needed, and agree with information above.  Signed,   [name], MD [facility] [place].  06/14/16 11:14 PM

## 2016-06-14 ENCOUNTER — Telehealth: Payer: Self-pay | Admitting: Family Medicine

## 2016-06-19 NOTE — Progress Notes (Signed)
Please place orders in EPIC as patient is being scheduled for a Pre-op appointment! Thank you! 

## 2016-06-25 NOTE — Progress Notes (Signed)
Cardiology Office Note    Date:  06/27/2016   ID:  Molly Lambert, DOB 1934-11-03, MRN [id number]  PCP:  Wendie Agreste, MD  Cardiologist:  New to Dr. Meda Coffee   Chief Complaint: Bradycardia and surgical clearance   History of Present Illness:   Molly Lambert is a 82 y.o. female with a history of hypertension, hyperlipidemia, thyroid disease and right breast cancer presented for evaluation of bradycardia and surgical clearance of left hip replacement.  The patient was seen by PCP for surgical clearance and noted bradycardic at rate of 46 bpm on EKG. Her atenolol discontinued and defer to cardiologist for further evaluation. She did complain of some fatigue and lightheadedness. She has missed up her medications that been corrected on follow up.   LDL 198 -  02/2016.  Here for further evaluation. Her dizziness and lightheadedness has been resolved since discontinuation of atenolol. However, she continued to feel fatigued that she attributes to chronic hip pain. She denies any exertional chest pain or shortness of breath. No orthopnea, PND, syncope, lower extremity edema, melena or blood in her stool or urine. She has a chronic rash on her face that she is being dealing with for the past few months. Referral to dermatologist for second opinion. She forgot to take her Norvasc this morning.   Nonsmoker. Nondrinker. Dad died of MI at age 93.  Past Medical History:  Diagnosis Date  . Arthritis   . Breast cancer ([place]) 11/2010   Rt breast  . Cataract   . Hypercholesteremia   . Hypertension    NO CARDIAC MD,  [facility]  PCP  ,  URGENT MED  . Leg laceration    WITH SURGERY  . S/P radiation therapy 03/14/11 - 04/05/11   Right Breast / 4256 cGy in 16 Fractions  . Thyroid disease     Past Surgical History:  Procedure Laterality Date  . BREAST LUMPECTOMY  01/22/11   RIGHT BREAST LUMPECTOMY WITH BIOSPY OF 1 SENTINEL NODE, INVASIVE GRADE II DUCTAL CARCINOMA , INTERMEDIATE GRADE DUCTAL  CARCINOMA IN SITU , DERMAL SKIN INVOLVED, MARGINS NEGATIVE,( 0/1)  NODE POSITIVE, ER+, PR+, LOW s-PHASE, HER 2 NEU- NO AMPLIFICATION  . BREAST SURGERY  11/28/10   SKIN-PUNCH BIOSPY RIGHT BREAST(NIPPLE), ER+, RP+, LOW S-PHASE, HER 2NEU NEGAITIVE  . EYE SURGERY      BILATERAL CATARACT SURGERY  . PARTIAL MASTECTOMY WITH NEEDLE LOCALIZATION  01/10/2012   Procedure: PARTIAL MASTECTOMY WITH NEEDLE LOCALIZATION;  Surgeon: Marcello Moores A. Cornett, MD;  Location: [facility];  Service: General;  Laterality: Right;  right breast needle localized partial mastectomy  . TUBAL LIGATION  1972    Current Medications:  Prior to Admission medications   Medication Sig Start Date End Date Taking? Authorizing Provider  amLODipine (NORVASC) 5 MG tablet Take 1 tablet (5 mg total) by mouth daily. 05/25/16  Yes Wendie Agreste, MD  chlorthalidone (HYGROTON) 25 MG tablet Take 25 mg by mouth daily.   Yes Historical Provider, MD  hydrocortisone 1 % lotion Apply 1 application topically 2 (two) times daily. 06/13/16  Yes Wendie Agreste, MD  ipratropium (ATROVENT) 0.03 % nasal spray Place 2 sprays into both nostrils 2 (two) times daily. As needed 02/29/16  Yes Wendie Agreste, MD  levothyroxine (SYNTHROID, LEVOTHROID) 50 MCG tablet Take 1 tablet (50 mcg total) by mouth daily before breakfast. 02/29/16  Yes Wendie Agreste, MD  metroNIDAZOLE (METROGEL) 0.75 % gel Apply 1 application topically 2 (two) times daily.  06/13/16  Yes Wendie Agreste, MD  simvastatin (ZOCOR) 20 MG tablet Take 1 tablet (20 mg total) by mouth daily. 02/29/16  Yes Wendie Agreste, MD     Allergies:   Patient has no known allergies.   Social History   Social History  . Marital status: Married    Spouse name: N/A  . Number of children: 3  . Years of education: 12   Occupational History  . Waitress     [employer]   Social History Main Topics  . Smoking status: Never Smoker  . Smokeless tobacco: Never Used  . Alcohol use  No     Comment: Rarely/On Special Occasions  . Drug use: No  . Sexual activity: No     Comment: G3, P3, MENARCHE, AGE 31, MENOPAUSE AGE 89, NO BC AND HRT X 10 YEARS   Other Topics Concern  . None   Social History Narrative   Married for 50 years      3 children, one deceased   74 grand-children, 3 great-grandchildren     Family History:  The patient's family history includes Heart attack in her father; Heart disease in her father; Kidney disease in her mother; Kidney failure in her mother; Lymphoma in her sister; Pancreatitis in her sister; Stroke in her mother.   ROS:   Please see the history of present illness.    ROS All other systems reviewed and are negative.   PHYSICAL EXAM:   VS:  BP (!) 150/84 Comment: Patient stated she has forgotten to take BP med this AM  Pulse 85   Ht 5\' 4"  (1.626 m)   Wt 166 lb 12 oz (75.6 kg)   SpO2 97%   BMI 28.62 kg/m    GEN: Well nourished, well developed, in no acute distress  HEENT: normal  Neck: no JVD, carotid bruits, or masses Cardiac:  RRR; no murmurs, rubs, or gallops,no edema  Respiratory:  clear to auscultation bilaterally, normal work of breathing GI: soft, nontender, nondistended, + BS MS: no deformity or atrophy  Skin: warm and dry.  Rash on her face Neuro:  Alert and Oriented x 3, Strength and sensation are intact Psych: euthymic mood, full affect  Wt Readings from Last 3 Encounters:  06/27/16 166 lb 12 oz (75.6 kg)  06/13/16 169 lb (76.7 kg)  05/25/16 168 lb 6 oz (76.4 kg)      Studies/Labs Reviewed:   EKG:  EKG is ordered today.  The ekg ordered today demonstrates NSR @ rate of 73 bpm with q wave interior laterally.   Recent Labs: 02/29/2016: TSH 2.700 04/16/2016: ALT 14; BUN 21.5; Creatinine 0.9; HGB 13.4; Platelets 253; Potassium 3.7; Sodium 138   Lipid Panel    Component Value Date/Time   CHOL 295 (H) 02/29/2016 1606   TRIG 187 (H) 02/29/2016 1606   HDL 60 02/29/2016 1606   CHOLHDL 4.9 (H) 02/29/2016 1606     CHOLHDL 3.6 02/21/2015 1525   VLDL 31 (H) 02/21/2015 1525   LDLCALC 198 (H) 02/29/2016 1606   LDLDIRECT 131 (H) 08/22/2011 1427    Additional studies/ records that were reviewed today include:   As above    ASSESSMENT & PLAN:    1. Bradycardia - Noted heart rate of 46 on EKG while on atenolol. Rate of 73 today. Her dizziness and lightheadedness resolved after discontinuation of atenolol.  2. Surgical clearance of left hip replacement. - No angina or dyspnea. She has a Q-wave anterior laterally.  Cardiac risk factors includes age, hypertension and hyperlipidemia. Her dad died of MI at age 89. Will get echo.   3. HTN - Elevated today to 150/84. However, hasn't took her norvasc dose today. She will takes when she goes home. Keep log.  4. HLD - Continue statin. Followed by PCP.  Medication Adjustments/Labs and Tests Ordered: Current medicines are reviewed at length with the patient today.  Concerns regarding medicines are outlined above.  Medication changes, Labs and Tests ordered today are listed in the Patient Instructions below. Patient Instructions  Medication Instructions:  Your physician recommends that you continue on your current medications as directed. Please refer to the Current Medication list given to you today.   Labwork: None ordered  Testing/Procedures: Your physician has requested that you have an echocardiogram. Echocardiography is a painless test that uses sound waves to create images of your heart. It provides your doctor with information about the size and shape of your heart and how well your heart's chambers and valves are working. This procedure takes approximately one hour. There are no restrictions for this procedure.    Follow-Up: Your physician recommends that you schedule a follow-up appointment in:  DEPENDING UPON TEST RESULTS   Any Other Special Instructions Will Be Listed Below (If Applicable).   Echocardiogram An echocardiogram, or  echocardiography, uses sound waves (ultrasound) to produce an image of your heart. The echocardiogram is simple, painless, obtained within a short period of time, and offers valuable information to your health care provider. The images from an echocardiogram can provide information such as:  Evidence of coronary artery disease (CAD).  Heart size.  Heart muscle function.  Heart valve function.  Aneurysm detection.  Evidence of a past heart attack.  Fluid buildup around the heart.  Heart muscle thickening.  Assess heart valve function. Tell a health care provider about:  Any allergies you have.  All medicines you are taking, including vitamins, herbs, eye drops, creams, and over-the-counter medicines.  Any problems you or family members have had with anesthetic medicines.  Any blood disorders you have.  Any surgeries you have had.  Any medical conditions you have.  Whether you are pregnant or may be pregnant. What happens before the procedure? No special preparation is needed. Eat and drink normally. What happens during the procedure?  In order to produce an image of your heart, gel will be applied to your chest and a wand-like tool (transducer) will be moved over your chest. The gel will help transmit the sound waves from the transducer. The sound waves will harmlessly bounce off your heart to allow the heart images to be captured in real-time motion. These images will then be recorded.  You may need an IV to receive a medicine that improves the quality of the pictures. What happens after the procedure? You may return to your normal schedule including diet, activities, and medicines, unless your health care provider tells you otherwise. This information is not intended to replace advice given to you by your health care provider. Make sure you discuss any questions you have with your health care provider. Document Released: 02/09/2000 Document Revised: 09/30/2015 Document  Reviewed: 10/19/2012 Elsevier Interactive Patient Education  2017 [employer].   If you need a refill on your cardiac medications before your next appointment, please call your pharmacy.      Jarrett Soho, [place]  06/27/2016 11:55 AM    [facility] [place], [place],   [address] Phone: [phone];  Fax: [phone]  

## 2016-06-27 ENCOUNTER — Ambulatory Visit (INDEPENDENT_AMBULATORY_CARE_PROVIDER_SITE_OTHER): Payer: Medicare Other | Admitting: Physician Assistant

## 2016-06-27 ENCOUNTER — Ambulatory Visit: Payer: Self-pay | Admitting: Orthopedic Surgery

## 2016-06-27 ENCOUNTER — Encounter: Payer: Self-pay | Admitting: Physician Assistant

## 2016-06-27 ENCOUNTER — Encounter (INDEPENDENT_AMBULATORY_CARE_PROVIDER_SITE_OTHER): Payer: Self-pay

## 2016-06-27 VITALS — BP 150/84 | HR 85 | Ht 64.0 in | Wt 166.8 lb

## 2016-06-27 DIAGNOSIS — R9431 Abnormal electrocardiogram [ECG] [EKG]: Secondary | ICD-10-CM | POA: Diagnosis not present

## 2016-06-27 DIAGNOSIS — I10 Essential (primary) hypertension: Secondary | ICD-10-CM | POA: Diagnosis not present

## 2016-06-27 DIAGNOSIS — Z01818 Encounter for other preprocedural examination: Secondary | ICD-10-CM | POA: Diagnosis not present

## 2016-06-27 DIAGNOSIS — E784 Other hyperlipidemia: Secondary | ICD-10-CM

## 2016-06-27 DIAGNOSIS — R001 Bradycardia, unspecified: Secondary | ICD-10-CM | POA: Diagnosis not present

## 2016-06-27 DIAGNOSIS — E7849 Other hyperlipidemia: Secondary | ICD-10-CM

## 2016-06-27 NOTE — Patient Instructions (Addendum)
Medication Instructions:  Your physician recommends that you continue on your current medications as directed. Please refer to the Current Medication list given to you today.   Labwork: None ordered  Testing/Procedures: Your physician has requested that you have an echocardiogram. Echocardiography is a painless test that uses sound waves to create images of your heart. It provides your doctor with information about the size and shape of your heart and how well your heart's chambers and valves are working. This procedure takes approximately one hour. There are no restrictions for this procedure.    Follow-Up: Your physician recommends that you schedule a follow-up appointment in: DEPENDING UPON TEST RESULTS   Any Other Special Instructions Will Be Listed Below (If Applicable).  Echocardiogram An echocardiogram, or echocardiography, uses sound waves (ultrasound) to produce an image of your heart. The echocardiogram is simple, painless, obtained within a short period of time, and offers valuable information to your health care provider. The images from an echocardiogram can provide information such as:  Evidence of coronary artery disease (CAD).  Heart size.  Heart muscle function.  Heart valve function.  Aneurysm detection.  Evidence of a past heart attack.  Fluid buildup around the heart.  Heart muscle thickening.  Assess heart valve function.  Tell a health care provider about:  Any allergies you have.  All medicines you are taking, including vitamins, herbs, eye drops, creams, and over-the-counter medicines.  Any problems you or family members have had with anesthetic medicines.  Any blood disorders you have.  Any surgeries you have had.  Any medical conditions you have.  Whether you are pregnant or may be pregnant. What happens before the procedure? No special preparation is needed. Eat and drink normally. What happens during the procedure?  In order to  produce an image of your heart, gel will be applied to your chest and a wand-like tool (transducer) will be moved over your chest. The gel will help transmit the sound waves from the transducer. The sound waves will harmlessly bounce off your heart to allow the heart images to be captured in real-time motion. These images will then be recorded.  You may need an IV to receive a medicine that improves the quality of the pictures. What happens after the procedure? You may return to your normal schedule including diet, activities, and medicines, unless your health care provider tells you otherwise. This information is not intended to replace advice given to you by your health care provider. Make sure you discuss any questions you have with your health care provider. Document Released: 02/09/2000 Document Revised: 09/30/2015 Document Reviewed: 10/19/2012 Elsevier Interactive Patient Education  2017 [employer] Inc.    If you need a refill on your cardiac medications before your next appointment, please call your pharmacy.   

## 2016-07-03 ENCOUNTER — Other Ambulatory Visit: Payer: Self-pay

## 2016-07-03 ENCOUNTER — Ambulatory Visit (HOSPITAL_COMMUNITY): Payer: Medicare Other | Attending: Cardiovascular Disease

## 2016-07-03 DIAGNOSIS — I10 Essential (primary) hypertension: Secondary | ICD-10-CM | POA: Insufficient documentation

## 2016-07-03 DIAGNOSIS — I340 Nonrheumatic mitral (valve) insufficiency: Secondary | ICD-10-CM | POA: Insufficient documentation

## 2016-07-03 DIAGNOSIS — Z01818 Encounter for other preprocedural examination: Secondary | ICD-10-CM | POA: Insufficient documentation

## 2016-07-03 DIAGNOSIS — I351 Nonrheumatic aortic (valve) insufficiency: Secondary | ICD-10-CM | POA: Diagnosis not present

## 2016-07-03 DIAGNOSIS — Z8249 Family history of ischemic heart disease and other diseases of the circulatory system: Secondary | ICD-10-CM | POA: Insufficient documentation

## 2016-07-03 DIAGNOSIS — E785 Hyperlipidemia, unspecified: Secondary | ICD-10-CM | POA: Insufficient documentation

## 2016-07-03 DIAGNOSIS — I071 Rheumatic tricuspid insufficiency: Secondary | ICD-10-CM | POA: Insufficient documentation

## 2016-07-08 ENCOUNTER — Other Ambulatory Visit (HOSPITAL_COMMUNITY): Payer: Self-pay | Admitting: Emergency Medicine

## 2016-07-08 NOTE — Progress Notes (Signed)
Lov/ cardiology clearance Bhagat PA 06-27-16 epic EKG 06-27-16 epic ECHO 07-03-16 epic CXR 05-25-16 epic

## 2016-07-08 NOTE — Patient Instructions (Signed)
Molly Lambert  07/08/2016   Your procedure is scheduled on: 07-17-16  Report to [facility] Main  Entrance    Report to admitting at 945AM   Call this number if you have problems the morning of surgery  [phone]   Remember: ONLY 1 PERSON MAY GO WITH YOU TO SHORT STAY TO GET  READY MORNING OF YOUR SURGERY.  Do not eat food or drink liquids :After Midnight.     Take these medicines the morning of surgery with A SIP OF WATER: amlodipine(norvasc), levothyroxine(synthroid), simvastatin(zocor)                                 You may not have any metal on your body including hair pins and              piercings  Do not wear jewelry, make-up, lotions, powders or perfumes, deodorant             Do not wear nail polish.  Do not shave  48 hours prior to surgery.                 Do not bring valuables to the hospital. [place].  Contacts, dentures or bridgework may not be worn into surgery.  Leave suitcase in the car. After surgery it may be brought to your room.               Please read over the following fact sheets you were given: _____________________________________________________________________             [facility] - Preparing for Surgery Before surgery, you can play an important role.  Because skin is not sterile, your skin needs to be as free of germs as possible.  You can reduce the number of germs on your skin by washing with CHG (chlorahexidine gluconate) soap before surgery.  CHG is an antiseptic cleaner which kills germs and bonds with the skin to continue killing germs even after washing. Please DO NOT use if you have an allergy to CHG or antibacterial soaps.  If your skin becomes reddened/irritated stop using the CHG and inform your nurse when you arrive at Short Stay. Do not shave (including legs and underarms) for at least 48 hours prior to the first CHG shower.  You may shave your  face/neck. Please follow these instructions carefully:  1.  Shower with CHG Soap the night before surgery and the  morning of Surgery.  2.  If you choose to wash your hair, wash your hair first as usual with your  normal  shampoo.  3.  After you shampoo, rinse your hair and body thoroughly to remove the  shampoo.                           4.  Use CHG as you would any other liquid soap.  You can apply chg directly  to the skin and wash                       Gently with a scrungie or clean washcloth.  5.  Apply the CHG Soap to your body ONLY FROM THE NECK DOWN.  Do not use on face/ open                           Wound or open sores. Avoid contact with eyes, ears mouth and genitals (private parts).                       Wash face,  Genitals (private parts) with your normal soap.             6.  Wash thoroughly, paying special attention to the area where your surgery  will be performed.  7.  Thoroughly rinse your body with warm water from the neck down.  8.  DO NOT shower/wash with your normal soap after using and rinsing off  the CHG Soap.                9.  Pat yourself dry with a clean towel.            10.  Wear clean pajamas.            11.  Place clean sheets on your bed the night of your first shower and do not  sleep with pets. Day of Surgery : Do not apply any lotions/deodorants the morning of surgery.  Please wear clean clothes to the hospital/surgery center.  FAILURE TO FOLLOW THESE INSTRUCTIONS MAY RESULT IN THE CANCELLATION OF YOUR SURGERY PATIENT SIGNATURE_________________________________  NURSE SIGNATURE__________________________________  ________________________________________________________________________   Molly Lambert  An incentive spirometer is a tool that can help keep your lungs clear and active. This tool measures how well you are filling your lungs with each breath. Taking long deep breaths may help reverse or decrease the chance of developing breathing  (pulmonary) problems (especially infection) following:  A long period of time when you are unable to move or be active. BEFORE THE PROCEDURE   If the spirometer includes an indicator to show your best effort, your nurse or respiratory therapist will set it to a desired goal.  If possible, sit up straight or lean slightly forward. Try not to slouch.  Hold the incentive spirometer in an upright position. INSTRUCTIONS FOR USE  1. Sit on the edge of your bed if possible, or sit up as far as you can in bed or on a chair. 2. Hold the incentive spirometer in an upright position. 3. Breathe out normally. 4. Place the mouthpiece in your mouth and seal your lips tightly around it. 5. Breathe in slowly and as deeply as possible, raising the piston or the ball toward the top of the column. 6. Hold your breath for 3-5 seconds or for as long as possible. Allow the piston or ball to fall to the bottom of the column. 7. Remove the mouthpiece from your mouth and breathe out normally. 8. Rest for a few seconds and repeat Steps 1 through 7 at least 10 times every 1-2 hours when you are awake. Take your time and take a few normal breaths between deep breaths. 9. The spirometer may include an indicator to show your best effort. Use the indicator as a goal to work toward during each repetition. 10. After each set of 10 deep breaths, practice coughing to be sure your lungs are clear. If you have an incision (the cut made at the time of surgery), support your incision when coughing by placing a pillow or rolled up towels firmly against it. Once you are able to get out of  bed, walk around indoors and cough well. You may stop using the incentive spirometer when instructed by your caregiver.  RISKS AND COMPLICATIONS  Take your time so you do not get dizzy or light-headed.  If you are in pain, you may need to take or ask for pain medication before doing incentive spirometry. It is harder to take a deep breath if you  are having pain. AFTER USE  Rest and breathe slowly and easily.  It can be helpful to keep track of a log of your progress. Your caregiver can provide you with a simple table to help with this. If you are using the spirometer at home, follow these instructions: [place] IF:   You are having difficultly using the spirometer.  You have trouble using the spirometer as often as instructed.  Your pain medication is not giving enough relief while using the spirometer.  You develop fever of 100.5 F (38.1 C) or higher. SEEK IMMEDIATE MEDICAL CARE IF:   You cough up bloody sputum that had not been present before.  You develop fever of 102 F (38.9 C) or greater.  You develop worsening pain at or near the incision site. MAKE SURE YOU:   Understand these instructions.  Will watch your condition.  Will get help right away if you are not doing well or get worse. Document Released: 06/24/2006 Document Revised: 05/06/2011 Document Reviewed: 08/25/2006 Exit[employer] Patient Information 2014 Exit[employer], [place].   ________________________________________________________________________  WHAT IS A BLOOD TRANSFUSION? Blood Transfusion Information  A transfusion is the replacement of blood or some of its parts. Blood is made up of multiple cells which provide different functions.  Red blood cells carry oxygen and are used for blood loss replacement.  White blood cells fight against infection.  Platelets control bleeding.  Plasma helps clot blood.  Other blood products are available for specialized needs, such as hemophilia or other clotting disorders. BEFORE THE TRANSFUSION  Who gives blood for transfusions?   Healthy volunteers who are fully evaluated to make sure their blood is safe. This is blood bank blood. Transfusion therapy is the safest it has ever been in the practice of medicine. Before blood is taken from a donor, a complete history is taken to make sure that person has  no history of diseases nor engages in risky social behavior (examples are intravenous drug use or sexual activity with multiple partners). The donor's travel history is screened to minimize risk of transmitting infections, such as malaria. The donated blood is tested for signs of infectious diseases, such as HIV and hepatitis. The blood is then tested to be sure it is compatible with you in order to minimize the chance of a transfusion reaction. If you or a relative donates blood, this is often done in anticipation of surgery and is not appropriate for emergency situations. It takes many days to process the donated blood. RISKS AND COMPLICATIONS Although transfusion therapy is very safe and saves many lives, the main dangers of transfusion include:   Getting an infectious disease.  Developing a transfusion reaction. This is an allergic reaction to something in the blood you were given. Every precaution is taken to prevent this. The decision to have a blood transfusion has been considered carefully by your caregiver before blood is given. Blood is not given unless the benefits outweigh the risks. AFTER THE TRANSFUSION  Right after receiving a blood transfusion, you will usually feel much better and more energetic. This is especially true if your red blood  cells have gotten low (anemic). The transfusion raises the level of the red blood cells which carry oxygen, and this usually causes an energy increase.  The nurse administering the transfusion will monitor you carefully for complications. HOME CARE INSTRUCTIONS  No special instructions are needed after a transfusion. You may find your energy is better. Speak with your caregiver about any limitations on activity for underlying diseases you may have. SEEK MEDICAL CARE IF:   Your condition is not improving after your transfusion.  You develop redness or irritation at the intravenous (IV) site. SEEK IMMEDIATE MEDICAL CARE IF:  Any of the following  symptoms occur over the next 12 hours:  Shaking chills.  You have a temperature by mouth above 102 F (38.9 C), not controlled by medicine.  Chest, back, or muscle pain.  People around you feel you are not acting correctly or are confused.  Shortness of breath or difficulty breathing.  Dizziness and fainting.  You get a rash or develop hives.  You have a decrease in urine output.  Your urine turns a dark color or changes to pink, red, or brown. Any of the following symptoms occur over the next 10 days:  You have a temperature by mouth above 102 F (38.9 C), not controlled by medicine.  Shortness of breath.  Weakness after normal activity.  The white part of the eye turns yellow (jaundice).  You have a decrease in the amount of urine or are urinating less often.  Your urine turns a dark color or changes to pink, red, or brown. Document Released: 02/09/2000 Document Revised: 05/06/2011 Document Reviewed: 09/28/2007 [facility] Patient Information 2014 [place], [place].  _______________________________________________________________________

## 2016-07-10 ENCOUNTER — Encounter (HOSPITAL_COMMUNITY): Payer: Self-pay

## 2016-07-10 ENCOUNTER — Encounter (HOSPITAL_COMMUNITY)
Admission: RE | Admit: 2016-07-10 | Discharge: 2016-07-10 | Disposition: A | Discharge status: Home / Self Care | Payer: Medicare Other | Source: Ambulatory Visit | Attending: Orthopedic Surgery | Admitting: Orthopedic Surgery

## 2016-07-10 DIAGNOSIS — Z01812 Encounter for preprocedural laboratory examination: Secondary | ICD-10-CM | POA: Insufficient documentation

## 2016-07-10 DIAGNOSIS — Z01818 Encounter for other preprocedural examination: Secondary | ICD-10-CM | POA: Diagnosis present

## 2016-07-10 DIAGNOSIS — M1712 Unilateral primary osteoarthritis, left knee: Secondary | ICD-10-CM | POA: Diagnosis not present

## 2016-07-10 HISTORY — DX: Malignant neoplasm of nipple and areola, right female breast: C50.011

## 2016-07-10 HISTORY — DX: Hypothyroidism, unspecified: E03.9

## 2016-07-10 HISTORY — DX: Erythematous condition, unspecified: L53.9

## 2016-07-10 LAB — CBC
HEMATOCRIT: 38.7 % (ref 36.0–46.0)
Hemoglobin: 13.1 g/dL (ref 12.0–15.0)
MCH: 31.4 pg (ref 26.0–34.0)
MCHC: 33.9 g/dL (ref 30.0–36.0)
MCV: 92.8 fL (ref 78.0–100.0)
Platelets: 325 10*3/uL (ref 150–400)
RBC: 4.17 MIL/uL (ref 3.87–5.11)
RDW: 12.8 % (ref 11.5–15.5)
WBC: 5.5 10*3/uL (ref 4.0–10.5)

## 2016-07-10 LAB — COMPREHENSIVE METABOLIC PANEL
ALK PHOS: 86 U/L (ref 38–126)
ALT: 16 U/L (ref 14–54)
AST: 18 U/L (ref 15–41)
Albumin: 4 g/dL (ref 3.5–5.0)
Anion gap: 10 (ref 5–15)
BILIRUBIN TOTAL: 0.8 mg/dL (ref 0.3–1.2)
BUN: 25 mg/dL — AB (ref 6–20)
CALCIUM: 9.5 mg/dL (ref 8.9–10.3)
CO2: 30 mmol/L (ref 22–32)
CREATININE: 0.92 mg/dL (ref 0.44–1.00)
Chloride: 99 mmol/L — ABNORMAL LOW (ref 101–111)
GFR calc Af Amer: >60 mL/min (ref >60)
GFR calc non Af Amer: 57 mL/min — ABNORMAL LOW (ref >60)
Glucose, Bld: 109 mg/dL — ABNORMAL HIGH (ref 65–99)
Potassium: 3.1 mmol/L — ABNORMAL LOW (ref 3.5–5.1)
Sodium: 139 mmol/L (ref 135–145)
TOTAL PROTEIN: 7.5 g/dL (ref 6.5–8.1)

## 2016-07-10 LAB — SURGICAL PCR SCREEN
MRSA, PCR: NEGATIVE
STAPHYLOCOCCUS AUREUS: POSITIVE — AB

## 2016-07-10 LAB — PROTIME-INR
INR: 0.98
PROTHROMBIN TIME: 13 s (ref 11.4–15.2)

## 2016-07-10 LAB — APTT: aPTT: 26 seconds (ref 24–36)

## 2016-07-10 LAB — ABO/RH: ABO/RH(D): AB POS

## 2016-07-11 ENCOUNTER — Ambulatory Visit (INDEPENDENT_AMBULATORY_CARE_PROVIDER_SITE_OTHER): Payer: Medicare Other | Admitting: Family Medicine

## 2016-07-11 ENCOUNTER — Encounter: Payer: Self-pay | Admitting: Family Medicine

## 2016-07-11 VITALS — BP 123/71 | HR 73 | Temp 98.2°F | Resp 18 | Ht 64.57 in | Wt 168.8 lb

## 2016-07-11 DIAGNOSIS — I10 Essential (primary) hypertension: Secondary | ICD-10-CM | POA: Diagnosis not present

## 2016-07-11 DIAGNOSIS — E876 Hypokalemia: Secondary | ICD-10-CM | POA: Diagnosis not present

## 2016-07-11 DIAGNOSIS — R21 Rash and other nonspecific skin eruption: Secondary | ICD-10-CM | POA: Diagnosis not present

## 2016-07-11 DIAGNOSIS — R001 Bradycardia, unspecified: Secondary | ICD-10-CM | POA: Diagnosis not present

## 2016-07-11 NOTE — Patient Instructions (Addendum)
I did notice that your potassium was slightly low on preop labs yesterday. I will check those labs today.  Blood pressure appears controlled now, and heart rate is at a better level. I suspect that was a likely cause of lightheadedness/dizziness.  Rash on face also appears improved. Okay to continue the use of both medications from last visit and follow-up with dermatology as planned.   Good luck with surgery and follow up in next few months to recheck blood pressure.     IF you received an x-ray today, you will receive an invoice from [facility]. Please contact [facility] at [phone] with questions or concerns regarding your invoice.   IF you received labwork today, you will receive an invoice from [facility]. Please contact [facility] at [phone] with questions or concerns regarding your invoice.   Our billing staff will not be able to assist you with questions regarding bills from these companies.  You will be contacted with the lab results as soon as they are available. The fastest way to get your results is to activate your My Chart account. Instructions are located on the last page of this paperwork. If you have not heard from [place] regarding the results in 2 weeks, please contact this office.

## 2016-07-11 NOTE — Progress Notes (Signed)
By signing my name below, I, Mesha Guinyard, attest that this documentation has been prepared under the direction and in the presence of [name], MD.  Electronically Signed: [name], Medical Scribe. 07/11/16. 4:44 PM.  Subjective:    Patient ID: Molly Lambert, female    DOB: 06/11/1934, 82 y.o.   MRN: [id number]  HPI Chief Complaint  Patient presents with  . Follow-up    HPI Comments: Molly Lambert is a 82 y.o. female who presents to Primary Care at [facility] for follow-up.  HTN with bradycardia: See 06/13/16 visit. Atenolol was discontinued, continued chlorthalidone and amlodipine at 5 mg QD with option to increase to 10 mg if elevated readings at home. She was experiencing some fatigue ad some light-headedness that was thought to be due to bradycardia. Reports fatigue and improving light-headedness and dizziness. Pt is still taking amlodipine 5 mg and chlorthalidone regularly. Pt was cleared for surgery by her cardiologist. Lab Results  Component Value Date   CREATININE 0.92 07/10/2016   BP Readings from Last 3 Encounters:  07/11/16 123/71  07/10/16 (!) 157/81  06/27/16 (!) 150/84   Rash: started metrogel and hydrocortisone lotion. Referred her to dermatology for rosacea vs atopic derm. Pt is using both metrogel and hydrocortisone lotion for relief of her sxs - isn't sure which one is helping. Pt is going to wait until after her surgery to see dermatology.  Hypokalemia: She had some pre-op testing yesterday with potassium 3.1.   Right Hip Pain: Reports right hip pain although she's getting surgery on her left hip. Plans on having help come assist her post-op.  Patient Active Problem List   Diagnosis Date Noted  . Breast microcalcifications 12/27/2011  . History of breast cancer 12/27/2011  . Hypothyroidism 08/25/2011  . Hypercholesteremia 08/25/2011  . HTN (hypertension) 08/25/2011  . Malignant neoplasm of central portion of right breast in female, estrogen  receptor positive ([facility]) 02/27/2011  . Breast mass, right 11/28/2010   Past Medical History:  Diagnosis Date  . Arthritis   . Breast cancer ([place]) 11/2010   Rt breast  . Cataract   . Erythema    reddened area of epidermis spnning concentrated around sinuses on the face; pt reports cracking, itchiness, and flaking  of affected skin; seen derm no absolute dx, will get 2nd opinion   . Hypercholesteremia   . Hypertension    NO CARDIAC MD,  [facility]  PCP  ,  [facility]  . Hypothyroidism   . Leg laceration    WITH SURGERY  . Paget's carcinoma of the nipple, right ([facility])    nipple removed  . S/P radiation therapy 03/14/11 - 04/05/11   Right Breast / 4256 cGy in 16 Fractions  . Thyroid disease    Past Surgical History:  Procedure Laterality Date  . BREAST LUMPECTOMY  01/22/11   RIGHT BREAST LUMPECTOMY WITH BIOSPY OF 1 SENTINEL NODE, INVASIVE GRADE II DUCTAL CARCINOMA , INTERMEDIATE GRADE DUCTAL CARCINOMA IN SITU , DERMAL SKIN INVOLVED, MARGINS NEGATIVE,( 0/1)  NODE POSITIVE, ER+, PR+, LOW s-PHASE, HER 2 NEU- NO AMPLIFICATION  . BREAST SURGERY  11/28/10   SKIN-PUNCH BIOSPY RIGHT BREAST(NIPPLE), ER+, RP+, LOW S-PHASE, HER 2NEU NEGAITIVE  . EYE SURGERY      BILATERAL CATARACT SURGERY  . PARTIAL MASTECTOMY WITH NEEDLE LOCALIZATION  01/10/2012   Procedure: PARTIAL MASTECTOMY WITH NEEDLE LOCALIZATION;  Surgeon: Marcello Moores A. Cornett, MD;  Location: [facility];  Service: General;  Laterality: Right;  right breast needle localized  partial mastectomy and removal of nipple   . TUBAL LIGATION  1972   No Known Allergies Prior to Admission medications   Medication Sig Start Date End Date Taking? Authorizing Provider  acetaminophen (TYLENOL) 500 MG tablet Take 1,500 mg by mouth 2 (two) times daily. Takes 3 tablets at a time    [provider]  amLODipine (NORVASC) 5 MG tablet Take 1 tablet (5 mg total) by mouth daily. 05/25/16   Wendie Agreste, MD  chlorthalidone (HYGROTON) 25 MG  tablet Take 25 mg by mouth daily.    [provider]  hydrocortisone 1 % lotion Apply 1 application topically 2 (two) times daily. 06/13/16   Wendie Agreste, MD  ibuprofen (ADVIL,MOTRIN) 200 MG tablet Take 800 mg by mouth 2 (two) times daily as needed for mild pain or moderate pain.    [provider]  ipratropium (ATROVENT) 0.03 % nasal spray Place 2 sprays into both nostrils 2 (two) times daily. As needed Patient not taking: Reported on 07/09/2016 02/29/16   Wendie Agreste, MD  levothyroxine (SYNTHROID, LEVOTHROID) 50 MCG tablet Take 1 tablet (50 mcg total) by mouth daily before breakfast. 02/29/16   Wendie Agreste, MD  metroNIDAZOLE (METROGEL) 0.75 % gel Apply 1 application topically 2 (two) times daily. Patient not taking: Reported on 07/09/2016 06/13/16   Wendie Agreste, MD  naproxen sodium (ANAPROX) 220 MG tablet Take 440 mg by mouth 2 (two) times daily as needed (pain).    [provider]  simvastatin (ZOCOR) 20 MG tablet Take 1 tablet (20 mg total) by mouth daily. 02/29/16   Wendie Agreste, MD   Social History   Social History  . Marital status: Married    Spouse name: N/A  . Number of children: 3  . Years of education: 12   Occupational History  . Waitress     [employer]   Social History Main Topics  . Smoking status: Never Smoker  . Smokeless tobacco: Never Used  . Alcohol use No     Comment: Rarely/On Special Occasions  . Drug use: No  . Sexual activity: No     Comment: G3, P3, MENARCHE, AGE 22, MENOPAUSE AGE 40, NO BC AND HRT X 10 YEARS   Other Topics Concern  . Not on file   Social History Narrative   Married for 56 years      3 children, one deceased   57 grand-children, 3 great-grandchildren   Review of Systems  Constitutional: Positive for fatigue.  Musculoskeletal: Positive for arthralgias (chronic hip pain).  Neurological: Negative for dizziness and light-headedness.   Objective:  Physical Exam    Constitutional: She is oriented to person, place, and time. She appears well-developed and well-nourished.  HENT:  Head: Normocephalic and atraumatic.  Eyes: Conjunctivae and EOM are normal. Pupils are equal, round, and reactive to light.  Neck: Carotid bruit is not present.  Cardiovascular: Normal rate, regular rhythm, normal heart sounds and intact distal pulses.  Exam reveals no gallop and no friction rub.   No murmur heard. Pulmonary/Chest: Effort normal and breath sounds normal. No respiratory distress. She has no wheezes. She has no rales.  Abdominal: Soft. She exhibits no pulsatile midline mass. There is no tenderness.  Neurological: She is alert and oriented to person, place, and time.  Skin: Skin is warm and dry.  Faint erythema on the lateral nasal folds and upper maxillary area Bridge of the nose overall looks clear  Psychiatric: She  has a normal mood and affect. Her behavior is normal.  Vitals reviewed.   Vitals:   07/11/16 1629  BP: 123/71  Pulse: 73  Resp: 18  Temp: 98.2 F (36.8 C)  TempSrc: Oral  SpO2: 97%  Weight: 168 lb 12.8 oz (76.6 kg)  Height: 5' 4.57" (1.64 m)   Body mass index is 28.47 kg/m. Assessment & Plan:  Molly Lambert is a 82 y.o. female Hypertension, unspecified type,   - stable, with resolution of bradycardia and improved dizziness. No change in meds at this point.   Rash of face  - rosacea vs other dermatitis.   - continue metronidazole gel, hydrocortisone topical and planned follo wup with derm after surgery.   Hypokalemia - Plan: Basic metabolic panel  - BMP to repeat K level.   Follow up with ortho regarding hip pain. May be in response to favoring other leg/hip.    No orders of the defined types were placed in this encounter.  Patient Instructions   I did notice that your potassium was slightly low on preop labs yesterday. I will check those labs today.  Blood pressure appears controlled now, and heart rate is at a better level.  I suspect that was a likely cause of lightheadedness/dizziness.  Rash on face also appears improved. Okay to continue the use of both medications from last visit and follow-up with dermatology as planned.   Good luck with surgery and follow up in next few months to recheck blood pressure.     IF you received an x-ray today, you will receive an invoice from [facility]. Please contact [facility] at [phone] with questions or concerns regarding your invoice.   IF you received labwork today, you will receive an invoice from [facility]. Please contact [facility] at [phone] with questions or concerns regarding your invoice.   Our billing staff will not be able to assist you with questions regarding bills from these companies.  You will be contacted with the lab results as soon as they are available. The fastest way to get your results is to activate your My Chart account. Instructions are located on the last page of this paperwork. If you have not heard from [place] regarding the results in 2 weeks, please contact this office.       I personally performed the services described in this documentation, which was scribed in my presence. The recorded information has been reviewed and considered for accuracy and completeness, addended by me as needed, and agree with information above.  Signed,   [name], MD Primary Care at [facility].  07/13/16 10:48 PM

## 2016-07-12 LAB — BASIC METABOLIC PANEL
BUN/Creatinine Ratio: 29 — ABNORMAL HIGH (ref 12–28)
BUN: 25 mg/dL (ref 8–27)
CALCIUM: 9.6 mg/dL (ref 8.7–10.3)
CHLORIDE: 96 mmol/L (ref 96–106)
CO2: 29 mmol/L (ref 18–29)
Creatinine, Ser: 0.86 mg/dL (ref 0.57–1.00)
GFR calc Af Amer: 74 mL/min/{1.73_m2} (ref >59)
GFR calc non Af Amer: 64 mL/min/{1.73_m2} (ref >59)
Glucose: 98 mg/dL (ref 65–99)
Potassium: 3.6 mmol/L (ref 3.5–5.2)
Sodium: 140 mmol/L (ref 134–144)

## 2016-07-16 ENCOUNTER — Ambulatory Visit: Payer: Self-pay | Admitting: Orthopedic Surgery

## 2016-07-16 NOTE — H&P (Signed)
Molly Lambert DOB: Jun 29, 1934 Married / Language: English / Race: White Female Date of Admission:  07/17/16 CC:  Left Hip pain History of Present Illness The patient is a 82 year old female who comes in for a preoperative History and Physical. The patient is scheduled for a left total hip arthroplasty (ANTERIOR) to be performed by Dr. Dione Plover. Aluisio, MD at [facility] on 07-17-2016. The patient is a 82 year old female who presented with a hip problem. The patient was seen in referral from Dr. Carlota Raspberry at [facility].The patient reports left hip problems including pain and weakness symptoms that have been present for 2 year(s). The symptoms began without any known injury. Symptoms reported include hip pain, night pain, difficulty flexing hip and difficulty ambulating The patient reports symptoms radiating to the: left groin, left thigh and left calf (also reports some pain radiating to her ankle. She denies any numbness or tingling or back pain.). The patient describes the hip problem as dull and burning. Onset of symptoms was gradual.The symptoms are described as moderate in severity.The patient feels as if their symptoms are does feel they are worsening. Prior to being seen today the patient was previously evaluated by a primary physician. Previous workup for this problem has included hip x-rays. Previous treatment for this problem has included nonsteroidal anti-inflammatory drugs (Aleve, prn) and non-opioid analgesics (Tylenol). She has had progressively worsening problems with the hip. Pain is in her groin and anterior thigh radiating down to her knee. It is getting progressively worse since it started two years ago, but now it is hurting at all times day and night. It is limiting what she can and cannot do. She has lost a lot of motion in the hip. Her right hip does not bother her. She did not have any injury leading to this problem. She does have a family history of hip arthritis in her  father who had a hip replacement in elderly years.  She is ready at this time to get her hip fixed. Risks and benefits of the surgery have been discussed with the patient and they elect to proceed with surgery.  There are on active contraindications to upcoming procedure such as ongoing infection or progressive neurological disease.   Problem List/Past Medical Primary osteoarthritis of left hip (M16.12)  Breast Cancer  Radiation Therapy High blood pressure  Hypercholesterolemia  Hypothyroidism  Varicose veins  Mumps  Positive MRSA Nasal Swab  2012  Allergies No Known Drug Allergies   Family History  Cancer  Sister. Cerebrovascular Accident  Mother. Drug / Alcohol Addiction  child First Degree Relatives  reported Heart Disease  Father. Hypertension  Father, Mother. Kidney disease  Mother.  Social History Children  2 Current work status  retired Furniture conservator/restorer weekly; does other Living situation  live with spouse Marital status  married Never consumed alcohol  05/07/2016: Never consumed alcohol No history of drug/alcohol rehab  Not under pain contract  Number of flights of stairs before winded  greater than 5 Tobacco use  Never smoker. 05/07/2016 Advance Directives  Living Will, [facility] POA  Medication History Aleve (Oral as needed) Specific strength unknown - Active. Tylenol (500MG  Capsule, Oral) Active. Chlorthalidone (25MG  Tablet, Oral daily) Active. Simvastatin (20MG  Tablet, Oral QHS) Active. AmLODIPine Besylate (5MG  Tablet, Oral daily) Active. Levothyroxine Sodium (50MCG Tablet, Oral once a day before breakfast) Active.   Past Surgical History Breast Mass; Local Excision  Date: 12/2010. right    Review of Systems  General Not Present- Chills, Fatigue, Fever, Memory Loss, Night Sweats, Weight Gain and Weight Loss. Skin Not Present- Eczema, Hives, Itching, Lesions and Rash. HEENT Present- Tinnitus. Not Present-  Dentures, Double Vision, Headache, Hearing Loss and Visual Loss. Respiratory Not Present- Allergies, Chronic Cough, Coughing up blood, Shortness of breath at rest and Shortness of breath with exertion. Cardiovascular Not Present- Chest Pain, Difficulty Breathing Lying Down, Murmur, Palpitations, Racing/skipping heartbeats and Swelling. Gastrointestinal Not Present- Abdominal Pain, Bloody Stool, Constipation, Diarrhea, Difficulty Swallowing, Heartburn, Jaundice, Loss of appetitie, Nausea and Vomiting. Female Genitourinary Not Present- Blood in Urine, Discharge, Flank Pain, Incontinence, Painful Urination, Urgency, Urinary frequency, Urinary Retention, Urinating at Night and Weak urinary stream. Musculoskeletal Present- Joint Pain. Not Present- Back Pain, Joint Swelling, Morning Stiffness, Muscle Pain, Muscle Weakness and Spasms. Neurological Not Present- Blackout spells, Difficulty with balance, Dizziness, Paralysis, Tremor and Weakness. Psychiatric Not Present- Insomnia.  Vitals  Weight: 162 lb Height: 64in Body Surface Area: 1.79 m Body Mass Index: 27.81 kg/m  Pulse: 76 (Regular)  BP: 148/80 (Sitting, Left Arm, Standard)       Physical Exam General Mental Status -Alert, cooperative and good historian. General Appearance-pleasant, Not in acute distress. Orientation-Oriented X3. Build & Nutrition-Well nourished and Well developed.  Head and Neck Head-normocephalic, atraumatic . Neck Global Assessment - supple, no bruit auscultated on the right, no bruit auscultated on the left.  Eye Vision-Wears corrective lenses. Pupil - Bilateral-Regular and Round. Motion - Bilateral-EOMI.  Chest and Lung Exam Auscultation Breath sounds - clear at anterior chest wall and clear at posterior chest wall. Adventitious sounds - No Adventitious sounds.  Cardiovascular Auscultation Rhythm - Regular rate and rhythm. Heart Sounds - S1 WNL and S2 WNL. Murmurs & Other Heart  Sounds - Auscultation of the heart reveals - No Murmurs.  Abdomen Palpation/Percussion Tenderness - Abdomen is non-tender to palpation. Rigidity (guarding) - Abdomen is soft. Auscultation Auscultation of the abdomen reveals - Bowel sounds normal.  Female Genitourinary Note: Not done, not pertinent to present illness   Musculoskeletal Note: On exam, very pleasant, well-developed female, alert and oriented, in no apparent distress. Her right hip can be flexed to 120, rotated in 30, out 40, abducted 40 without discomfort. Left hip flexed to 90, no internal rotation, about 20 external rotation, 20 abduction with discomfort. The exam is normal bilaterally. Pulse, sensation, and motor are intact. She does have a significantly antalgic gait pattern on the left.  RADIOGRAPHS AP pelvis and lateral of the left hip shows severe end-stage bone on bone arthritis with subchondral cystic formation and osteophyte formation.   Assessment & Plan   Note:Surgical Plans: Left Total Hip Replacement - Anterior Approach  Disposition: Home  PCP: Dr. Cindee Lame - [facility]  Topical TXA  Anesthesia Issues: None  Patient was instructed on what medications to stop prior to surgery.  AVOID RIGHT ARM FOR BP'S OR IV'S  Arlee Muslim, PA-C

## 2016-07-17 ENCOUNTER — Inpatient Hospital Stay (HOSPITAL_COMMUNITY): Payer: Medicare Other | Admitting: Certified Registered Nurse Anesthetist

## 2016-07-17 ENCOUNTER — Encounter (HOSPITAL_COMMUNITY)
Admission: RE | Disposition: A | Discharge status: Home Health | Payer: Self-pay | Source: Ambulatory Visit | Attending: Orthopedic Surgery

## 2016-07-17 ENCOUNTER — Inpatient Hospital Stay (HOSPITAL_COMMUNITY): Payer: Medicare Other

## 2016-07-17 ENCOUNTER — Encounter (HOSPITAL_COMMUNITY): Payer: Self-pay

## 2016-07-17 ENCOUNTER — Inpatient Hospital Stay (HOSPITAL_COMMUNITY)
Admission: RE | Admit: 2016-07-17 | Discharge: 2016-07-18 | DRG: 470 | Disposition: A | Discharge status: Home Health | Payer: Medicare Other | Source: Ambulatory Visit | Attending: Orthopedic Surgery | Admitting: Orthopedic Surgery

## 2016-07-17 DIAGNOSIS — M1612 Unilateral primary osteoarthritis, left hip: Secondary | ICD-10-CM | POA: Diagnosis not present

## 2016-07-17 DIAGNOSIS — Z79899 Other long term (current) drug therapy: Secondary | ICD-10-CM

## 2016-07-17 DIAGNOSIS — I10 Essential (primary) hypertension: Secondary | ICD-10-CM | POA: Diagnosis present

## 2016-07-17 DIAGNOSIS — Z96649 Presence of unspecified artificial hip joint: Secondary | ICD-10-CM

## 2016-07-17 DIAGNOSIS — E7800 Pure hypercholesterolemia, unspecified: Secondary | ICD-10-CM | POA: Diagnosis present

## 2016-07-17 DIAGNOSIS — M169 Osteoarthritis of hip, unspecified: Secondary | ICD-10-CM | POA: Diagnosis present

## 2016-07-17 DIAGNOSIS — Z923 Personal history of irradiation: Secondary | ICD-10-CM

## 2016-07-17 DIAGNOSIS — E039 Hypothyroidism, unspecified: Secondary | ICD-10-CM | POA: Diagnosis present

## 2016-07-17 DIAGNOSIS — I8390 Asymptomatic varicose veins of unspecified lower extremity: Secondary | ICD-10-CM | POA: Diagnosis present

## 2016-07-17 HISTORY — PX: TOTAL HIP ARTHROPLASTY: SHX124

## 2016-07-17 LAB — TYPE AND SCREEN
ABO/RH(D): AB POS
Antibody Screen: NEGATIVE

## 2016-07-17 SURGERY — ARTHROPLASTY, HIP, TOTAL, ANTERIOR APPROACH
Anesthesia: Spinal | Site: Hip | Laterality: Left

## 2016-07-17 MED ORDER — DEXAMETHASONE SODIUM PHOSPHATE 10 MG/ML IJ SOLN
10.0000 mg | Freq: Once | INTRAMUSCULAR | Status: AC
  Administered 2016-07-17: 10 mg via INTRAVENOUS

## 2016-07-17 MED ORDER — PROPOFOL 10 MG/ML IV BOLUS
INTRAVENOUS | Status: AC
  Filled 2016-07-17: qty 20

## 2016-07-17 MED ORDER — ACETAMINOPHEN 650 MG RE SUPP: 650.0000 mg | Freq: Four times a day (QID) | RECTAL | Status: DC | PRN

## 2016-07-17 MED ORDER — PROPOFOL 10 MG/ML IV BOLUS
INTRAVENOUS | Status: AC
  Filled 2016-07-17: qty 40

## 2016-07-17 MED ORDER — CEFAZOLIN SODIUM-DEXTROSE 2-4 GM/100ML-% IV SOLN
2.0000 g | INTRAVENOUS | Status: AC
  Administered 2016-07-17: 2 g via INTRAVENOUS

## 2016-07-17 MED ORDER — BISACODYL 10 MG RE SUPP: 10.0000 mg | Freq: Every day | RECTAL | Status: DC | PRN

## 2016-07-17 MED ORDER — EPHEDRINE SULFATE 50 MG/ML IJ SOLN
INTRAMUSCULAR | Status: DC | PRN
  Administered 2016-07-17 (×3): 10 mg via INTRAVENOUS

## 2016-07-17 MED ORDER — EPHEDRINE 5 MG/ML INJ
INTRAVENOUS | Status: AC
  Filled 2016-07-17: qty 10

## 2016-07-17 MED ORDER — POLYETHYLENE GLYCOL 3350 17 G PO PACK: 17.0000 g | PACK | Freq: Every day | ORAL | Status: DC | PRN

## 2016-07-17 MED ORDER — LIDOCAINE 2% (20 MG/ML) 5 ML SYRINGE
INTRAMUSCULAR | Status: AC
  Filled 2016-07-17: qty 5

## 2016-07-17 MED ORDER — RIVAROXABAN 10 MG PO TABS
10.0000 mg | ORAL_TABLET | Freq: Every day | ORAL | Status: DC
  Administered 2016-07-18: 09:00:00 10 mg via ORAL
  Filled 2016-07-17 (×2): qty 1

## 2016-07-17 MED ORDER — SODIUM CHLORIDE 0.9 % IV SOLN
INTRAVENOUS | Status: DC | PRN
  Administered 2016-07-17: 25 ug/min via INTRAVENOUS

## 2016-07-17 MED ORDER — LEVOTHYROXINE SODIUM 50 MCG PO TABS
50.0000 ug | ORAL_TABLET | Freq: Every day | ORAL | Status: DC
  Administered 2016-07-18: 50 ug via ORAL
  Filled 2016-07-17 (×3): qty 1

## 2016-07-17 MED ORDER — ACETAMINOPHEN 500 MG PO TABS
1000.0000 mg | ORAL_TABLET | Freq: Four times a day (QID) | ORAL | Status: AC
  Administered 2016-07-17 – 2016-07-18 (×4): 1000 mg via ORAL
  Filled 2016-07-17 (×4): qty 2

## 2016-07-17 MED ORDER — ONDANSETRON HCL 4 MG/2ML IJ SOLN: 4.0000 mg | Freq: Four times a day (QID) | INTRAMUSCULAR | Status: DC | PRN

## 2016-07-17 MED ORDER — METHOCARBAMOL 500 MG PO TABS
500.0000 mg | ORAL_TABLET | Freq: Four times a day (QID) | ORAL | Status: DC | PRN
  Administered 2016-07-17: 500 mg via ORAL
  Filled 2016-07-17: qty 1

## 2016-07-17 MED ORDER — TRANEXAMIC ACID 1000 MG/10ML IV SOLN
2000.0000 mg | Freq: Once | INTRAVENOUS | Status: DC
  Filled 2016-07-17: qty 20

## 2016-07-17 MED ORDER — 0.9 % SODIUM CHLORIDE (POUR BTL) OPTIME
TOPICAL | Status: DC | PRN
  Administered 2016-07-17: 1000 mL

## 2016-07-17 MED ORDER — FENTANYL CITRATE (PF) 100 MCG/2ML IJ SOLN
INTRAMUSCULAR | Status: DC | PRN
  Administered 2016-07-17: 50 ug via INTRAVENOUS

## 2016-07-17 MED ORDER — DIPHENHYDRAMINE HCL 12.5 MG/5ML PO ELIX
12.5000 mg | ORAL_SOLUTION | ORAL | Status: DC | PRN
  Administered 2016-07-17: 23:00:00 12.5 mg via ORAL
  Filled 2016-07-17: qty 5

## 2016-07-17 MED ORDER — ONDANSETRON HCL 4 MG/2ML IJ SOLN
INTRAMUSCULAR | Status: DC | PRN
  Administered 2016-07-17: 4 mg via INTRAVENOUS

## 2016-07-17 MED ORDER — BUPIVACAINE IN DEXTROSE 0.75-8.25 % IT SOLN
INTRATHECAL | Status: DC | PRN
  Administered 2016-07-17: 2 mL via INTRATHECAL

## 2016-07-17 MED ORDER — MENTHOL 3 MG MT LOZG: 1.0000 | LOZENGE | OROMUCOSAL | Status: DC | PRN

## 2016-07-17 MED ORDER — ONDANSETRON HCL 4 MG PO TABS: 4.0000 mg | ORAL_TABLET | Freq: Four times a day (QID) | ORAL | Status: DC | PRN

## 2016-07-17 MED ORDER — CHLORTHALIDONE 25 MG PO TABS
25.0000 mg | ORAL_TABLET | Freq: Every day | ORAL | Status: DC
  Administered 2016-07-18: 09:00:00 25 mg via ORAL
  Filled 2016-07-17 (×3): qty 1

## 2016-07-17 MED ORDER — OXYCODONE HCL 5 MG PO TABS
5.0000 mg | ORAL_TABLET | ORAL | Status: DC | PRN
  Administered 2016-07-17 (×2): 5 mg via ORAL
  Administered 2016-07-18 (×2): 10 mg via ORAL
  Administered 2016-07-18: 06:00:00 5 mg via ORAL
  Filled 2016-07-17: qty 1
  Filled 2016-07-17: qty 2
  Filled 2016-07-17 (×2): qty 1
  Filled 2016-07-17: qty 2

## 2016-07-17 MED ORDER — TRAMADOL HCL 50 MG PO TABS: 50.0000 mg | ORAL_TABLET | Freq: Four times a day (QID) | ORAL | Status: DC | PRN

## 2016-07-17 MED ORDER — ACETAMINOPHEN 325 MG PO TABS: 650.0000 mg | ORAL_TABLET | Freq: Four times a day (QID) | ORAL | Status: DC | PRN

## 2016-07-17 MED ORDER — ACETAMINOPHEN 10 MG/ML IV SOLN
INTRAVENOUS | Status: AC
  Filled 2016-07-17: qty 100

## 2016-07-17 MED ORDER — ONDANSETRON HCL 4 MG/2ML IJ SOLN
INTRAMUSCULAR | Status: AC
  Filled 2016-07-17: qty 2

## 2016-07-17 MED ORDER — BUPIVACAINE HCL (PF) 0.25 % IJ SOLN
INTRAMUSCULAR | Status: AC
  Filled 2016-07-17: qty 30

## 2016-07-17 MED ORDER — FENTANYL CITRATE (PF) 100 MCG/2ML IJ SOLN
INTRAMUSCULAR | Status: AC
  Filled 2016-07-17: qty 2

## 2016-07-17 MED ORDER — METOCLOPRAMIDE HCL 5 MG PO TABS: 5.0000 mg | ORAL_TABLET | Freq: Three times a day (TID) | ORAL | Status: DC | PRN

## 2016-07-17 MED ORDER — CHLORHEXIDINE GLUCONATE 4 % EX LIQD: 60.0000 mL | Freq: Once | CUTANEOUS | Status: DC

## 2016-07-17 MED ORDER — METHOCARBAMOL 1000 MG/10ML IJ SOLN
500.0000 mg | Freq: Four times a day (QID) | INTRAMUSCULAR | Status: DC | PRN
  Administered 2016-07-17: 500 mg via INTRAVENOUS
  Filled 2016-07-17: qty 550

## 2016-07-17 MED ORDER — DEXAMETHASONE SODIUM PHOSPHATE 10 MG/ML IJ SOLN
10.0000 mg | Freq: Once | INTRAMUSCULAR | Status: AC
  Administered 2016-07-18: 09:00:00 10 mg via INTRAVENOUS
  Filled 2016-07-17: qty 1

## 2016-07-17 MED ORDER — SIMVASTATIN 20 MG PO TABS
20.0000 mg | ORAL_TABLET | Freq: Every day | ORAL | Status: DC
  Administered 2016-07-18: 20 mg via ORAL
  Filled 2016-07-17 (×2): qty 1

## 2016-07-17 MED ORDER — STERILE WATER FOR IRRIGATION IR SOLN
Status: DC | PRN
  Administered 2016-07-17: 2000 mL

## 2016-07-17 MED ORDER — PROMETHAZINE HCL 25 MG/ML IJ SOLN: 6.2500 mg | INTRAMUSCULAR | Status: DC | PRN

## 2016-07-17 MED ORDER — DOCUSATE SODIUM 100 MG PO CAPS
100.0000 mg | ORAL_CAPSULE | Freq: Two times a day (BID) | ORAL | Status: DC
  Administered 2016-07-17 – 2016-07-18 (×2): 100 mg via ORAL
  Filled 2016-07-17 (×2): qty 1

## 2016-07-17 MED ORDER — METOCLOPRAMIDE HCL 5 MG/ML IJ SOLN: 5.0000 mg | Freq: Three times a day (TID) | INTRAMUSCULAR | Status: DC | PRN

## 2016-07-17 MED ORDER — DEXAMETHASONE SODIUM PHOSPHATE 10 MG/ML IJ SOLN
INTRAMUSCULAR | Status: AC
  Filled 2016-07-17: qty 1

## 2016-07-17 MED ORDER — MORPHINE SULFATE (PF) 4 MG/ML IV SOLN
1.0000 mg | INTRAVENOUS | Status: DC | PRN
  Administered 2016-07-17: 16:00:00 1 mg via INTRAVENOUS
  Filled 2016-07-17: qty 1

## 2016-07-17 MED ORDER — TRANEXAMIC ACID 1000 MG/10ML IV SOLN
INTRAVENOUS | Status: AC | PRN
  Administered 2016-07-17: 2000 mg via TOPICAL

## 2016-07-17 MED ORDER — FLEET ENEMA 7-19 GM/118ML RE ENEM: 1.0000 | ENEMA | Freq: Once | RECTAL | Status: DC | PRN

## 2016-07-17 MED ORDER — LACTATED RINGERS IV SOLN
INTRAVENOUS | Status: DC
  Administered 2016-07-17: 13:00:00 via INTRAVENOUS
  Administered 2016-07-17: 1000 mL via INTRAVENOUS

## 2016-07-17 MED ORDER — ACETAMINOPHEN 10 MG/ML IV SOLN
1000.0000 mg | Freq: Once | INTRAVENOUS | Status: AC
Start: 2016-07-17 — End: 2016-07-17
  Administered 2016-07-17: 1000 mg via INTRAVENOUS

## 2016-07-17 MED ORDER — PHENOL 1.4 % MT LIQD: 1.0000 | OROMUCOSAL | Status: DC | PRN

## 2016-07-17 MED ORDER — AMLODIPINE BESYLATE 5 MG PO TABS
5.0000 mg | ORAL_TABLET | Freq: Every day | ORAL | Status: DC
  Administered 2016-07-18: 5 mg via ORAL
  Filled 2016-07-17 (×3): qty 1

## 2016-07-17 MED ORDER — HYDROMORPHONE HCL 1 MG/ML IJ SOLN: 0.2500 mg | INTRAMUSCULAR | Status: DC | PRN

## 2016-07-17 MED ORDER — SODIUM CHLORIDE 0.9 % IV SOLN
INTRAVENOUS | Status: DC
  Administered 2016-07-17: 19:00:00 via INTRAVENOUS

## 2016-07-17 MED ORDER — BUPIVACAINE HCL (PF) 0.25 % IJ SOLN
INTRAMUSCULAR | Status: DC | PRN
Start: 2016-07-17 — End: 2016-07-17
  Administered 2016-07-17: 30 mL

## 2016-07-17 MED ORDER — CEFAZOLIN SODIUM-DEXTROSE 2-4 GM/100ML-% IV SOLN
2.0000 g | Freq: Four times a day (QID) | INTRAVENOUS | Status: AC
  Administered 2016-07-17 (×2): 2 g via INTRAVENOUS
  Filled 2016-07-17 (×2): qty 100

## 2016-07-17 MED ORDER — PROPOFOL 500 MG/50ML IV EMUL
INTRAVENOUS | Status: DC | PRN
  Administered 2016-07-17: 75 ug/kg/min via INTRAVENOUS

## 2016-07-17 MED ORDER — CEFAZOLIN SODIUM-DEXTROSE 2-4 GM/100ML-% IV SOLN
INTRAVENOUS | Status: AC
Start: 2016-07-17 — End: 2016-07-17
  Filled 2016-07-17: qty 100

## 2016-07-17 MED ORDER — PHENYLEPHRINE HCL 10 MG/ML IJ SOLN
INTRAMUSCULAR | Status: AC
  Filled 2016-07-17: qty 1

## 2016-07-17 SURGICAL SUPPLY — 38 items
BAG DECANTER FOR FLEXI CONT (MISCELLANEOUS) ×3 IMPLANT
BAG ZIPLOCK 12X15 (MISCELLANEOUS) ×3 IMPLANT
BLADE SAG 18X100X1.27 (BLADE) ×3 IMPLANT
CAPT HIP TOTAL 2 ×3 IMPLANT
CLOSURE WOUND 1/2 X4 (GAUZE/BANDAGES/DRESSINGS) ×2
CLOTH BEACON ORANGE TIMEOUT ST (SAFETY) ×3 IMPLANT
COVER PERINEAL POST (MISCELLANEOUS) ×3 IMPLANT
COVER SURGICAL LIGHT HANDLE (MISCELLANEOUS) ×3 IMPLANT
DRAPE STERI IOBAN 125X83 (DRAPES) ×3 IMPLANT
DRAPE U-SHAPE 47X51 STRL (DRAPES) ×6 IMPLANT
DRSG ADAPTIC 3X8 NADH LF (GAUZE/BANDAGES/DRESSINGS) ×3 IMPLANT
DRSG MEPILEX BORDER 4X4 (GAUZE/BANDAGES/DRESSINGS) ×3 IMPLANT
DRSG MEPILEX BORDER 4X8 (GAUZE/BANDAGES/DRESSINGS) ×3 IMPLANT
DRSG TEGADERM 4X4.75 (GAUZE/BANDAGES/DRESSINGS) ×3 IMPLANT
DURAPREP 26ML APPLICATOR (WOUND CARE) ×3 IMPLANT
ELECT REM PT RETURN 15FT ADLT (MISCELLANEOUS) ×3 IMPLANT
EVACUATOR 1/8 PVC DRAIN (DRAIN) ×3 IMPLANT
GLOVE BIO SURGEON STRL SZ7.5 (GLOVE) ×3 IMPLANT
GLOVE BIO SURGEON STRL SZ8 (GLOVE) ×6 IMPLANT
GLOVE BIOGEL PI IND STRL 7.5 (GLOVE) ×5 IMPLANT
GLOVE BIOGEL PI IND STRL 8 (GLOVE) ×2 IMPLANT
GLOVE BIOGEL PI INDICATOR 7.5 (GLOVE) ×10
GLOVE BIOGEL PI INDICATOR 8 (GLOVE) ×4
GLOVE SURG SS PI 7.5 STRL IVOR (GLOVE) ×3 IMPLANT
GOWN L4 XXLG W/PAP TWL (GOWN DISPOSABLE) ×3 IMPLANT
GOWN STRL REUS W/TWL LRG LVL3 (GOWN DISPOSABLE) ×3 IMPLANT
GOWN STRL REUS W/TWL XL LVL3 (GOWN DISPOSABLE) ×6 IMPLANT
PACK ANTERIOR HIP CUSTOM (KITS) ×3 IMPLANT
STRIP CLOSURE SKIN 1/2X4 (GAUZE/BANDAGES/DRESSINGS) ×4 IMPLANT
SUT ETHIBOND NAB CT1 #1 30IN (SUTURE) ×3 IMPLANT
SUT MNCRL AB 4-0 PS2 18 (SUTURE) ×3 IMPLANT
SUT STRATAFIX 0 PDS 27 VIOLET (SUTURE) ×3
SUT VIC AB 2-0 CT1 27 (SUTURE) ×4
SUT VIC AB 2-0 CT1 TAPERPNT 27 (SUTURE) ×2 IMPLANT
SUTURE STRATFX 0 PDS 27 VIOLET (SUTURE) ×1 IMPLANT
SYR 50ML LL SCALE MARK (SYRINGE) ×3 IMPLANT
TRAY FOLEY CATH SILVER 14FR (SET/KITS/TRAYS/PACK) ×3 IMPLANT
YANKAUER SUCT BULB TIP 10FT TU (MISCELLANEOUS) ×3 IMPLANT

## 2016-07-17 NOTE — Transfer of Care (Signed)
Immediate Anesthesia Transfer of Care Note  Patient: Molly Lambert  Procedure(s) Performed: Procedure(s): LEFT TOTAL HIP ARTHROPLASTY ANTERIOR APPROACH (Left)  Patient Location: PACU  Anesthesia Type:Spinal  Level of Consciousness:  sedated, patient cooperative and responds to stimulation  Airway & Oxygen Therapy:Patient Spontanous Breathing and Patient connected to face mask oxgen  Post-op Assessment:  Report given to PACU RN and Post -op Vital signs reviewed and stable  Post vital signs:  Reviewed and stable  Last Vitals:  Vitals:   07/17/16 1012  BP: (!) 180/82  Pulse: 78  Resp: 18  Temp: 37.4 C    Complications: No apparent anesthesia complications

## 2016-07-17 NOTE — H&P (View-Only) (Signed)
Molly Lambert DOB: 14-Oct-1934 Married / Language: English / Race: White Female Date of Admission:  07/17/16 CC:  Left Hip pain History of Present Illness The patient is a 82 year old female who comes in for a preoperative History and Physical. The patient is scheduled for a left total hip arthroplasty (ANTERIOR) to be performed by Dr. Dione Plover. Aluisio, MD at [facility] on 07-17-2016. The patient is a 82 year old female who presented with a hip problem. The patient was seen in referral from Dr. Carlota Raspberry at [facility].The patient reports left hip problems including pain and weakness symptoms that have been present for 2 year(s). The symptoms began without any known injury. Symptoms reported include hip pain, night pain, difficulty flexing hip and difficulty ambulating The patient reports symptoms radiating to the: left groin, left thigh and left calf (also reports some pain radiating to her ankle. She denies any numbness or tingling or back pain.). The patient describes the hip problem as dull and burning. Onset of symptoms was gradual.The symptoms are described as moderate in severity.The patient feels as if their symptoms are does feel they are worsening. Prior to being seen today the patient was previously evaluated by a primary physician. Previous workup for this problem has included hip x-rays. Previous treatment for this problem has included nonsteroidal anti-inflammatory drugs (Aleve, prn) and non-opioid analgesics (Tylenol). She has had progressively worsening problems with the hip. Pain is in her groin and anterior thigh radiating down to her knee. It is getting progressively worse since it started two years ago, but now it is hurting at all times day and night. It is limiting what she can and cannot do. She has lost a lot of motion in the hip. Her right hip does not bother her. She did not have any injury leading to this problem. She does have a family history of hip arthritis in her  father who had a hip replacement in elderly years.  She is ready at this time to get her hip fixed. Risks and benefits of the surgery have been discussed with the patient and they elect to proceed with surgery.  There are on active contraindications to upcoming procedure such as ongoing infection or progressive neurological disease.   Problem List/Past Medical Primary osteoarthritis of left hip (M16.12)  Breast Cancer  Radiation Therapy High blood pressure  Hypercholesterolemia  Hypothyroidism  Varicose veins  Mumps  Positive MRSA Nasal Swab  2012  Allergies No Known Drug Allergies   Family History  Cancer  Sister. Cerebrovascular Accident  Mother. Drug / Alcohol Addiction  child First Degree Relatives  reported Heart Disease  Father. Hypertension  Father, Mother. Kidney disease  Mother.  Social History Children  2 Current work status  retired Furniture conservator/restorer weekly; does other Living situation  live with spouse Marital status  married Never consumed alcohol  05/07/2016: Never consumed alcohol No history of drug/alcohol rehab  Not under pain contract  Number of flights of stairs before winded  greater than 5 Tobacco use  Never smoker. 05/07/2016 Advance Directives  Living Will, [employer] POA  Medication History Aleve (Oral as needed) Specific strength unknown - Active. Tylenol (500MG  Capsule, Oral) Active. Chlorthalidone (25MG  Tablet, Oral daily) Active. Simvastatin (20MG  Tablet, Oral QHS) Active. AmLODIPine Besylate (5MG  Tablet, Oral daily) Active. Levothyroxine Sodium (50MCG Tablet, Oral once a day before breakfast) Active.   Past Surgical History Breast Mass; Local Excision  Date: 12/2010. right    Review of Systems  General Not Present- Chills, Fatigue, Fever, Memory Loss, Night Sweats, Weight Gain and Weight Loss. Skin Not Present- Eczema, Hives, Itching, Lesions and Rash. HEENT Present- Tinnitus. Not Present-  Dentures, Double Vision, Headache, Hearing Loss and Visual Loss. Respiratory Not Present- Allergies, Chronic Cough, Coughing up blood, Shortness of breath at rest and Shortness of breath with exertion. Cardiovascular Not Present- Chest Pain, Difficulty Breathing Lying Down, Murmur, Palpitations, Racing/skipping heartbeats and Swelling. Gastrointestinal Not Present- Abdominal Pain, Bloody Stool, Constipation, Diarrhea, Difficulty Swallowing, Heartburn, Jaundice, Loss of appetitie, Nausea and Vomiting. Female Genitourinary Not Present- Blood in Urine, Discharge, Flank Pain, Incontinence, Painful Urination, Urgency, Urinary frequency, Urinary Retention, Urinating at Night and Weak urinary stream. Musculoskeletal Present- Joint Pain. Not Present- Back Pain, Joint Swelling, Morning Stiffness, Muscle Pain, Muscle Weakness and Spasms. Neurological Not Present- Blackout spells, Difficulty with balance, Dizziness, Paralysis, Tremor and Weakness. Psychiatric Not Present- Insomnia.  Vitals  Weight: 162 lb Height: 64in Body Surface Area: 1.79 m Body Mass Index: 27.81 kg/m  Pulse: 76 (Regular)  BP: 148/80 (Sitting, Left Arm, Standard)       Physical Exam General Mental Status -Alert, cooperative and good historian. General Appearance-pleasant, Not in acute distress. Orientation-Oriented X3. Build & Nutrition-Well nourished and Well developed.  Head and Neck Head-normocephalic, atraumatic . Neck Global Assessment - supple, no bruit auscultated on the right, no bruit auscultated on the left.  Eye Vision-Wears corrective lenses. Pupil - Bilateral-Regular and Round. Motion - Bilateral-EOMI.  Chest and Lung Exam Auscultation Breath sounds - clear at anterior chest wall and clear at posterior chest wall. Adventitious sounds - No Adventitious sounds.  Cardiovascular Auscultation Rhythm - Regular rate and rhythm. Heart Sounds - S1 WNL and S2 WNL. Murmurs & Other Heart  Sounds - Auscultation of the heart reveals - No Murmurs.  Abdomen Palpation/Percussion Tenderness - Abdomen is non-tender to palpation. Rigidity (guarding) - Abdomen is soft. Auscultation Auscultation of the abdomen reveals - Bowel sounds normal.  Female Genitourinary Note: Not done, not pertinent to present illness   Musculoskeletal Note: On exam, very pleasant, well-developed female, alert and oriented, in no apparent distress. Her right hip can be flexed to 120, rotated in 30, out 40, abducted 40 without discomfort. Left hip flexed to 90, no internal rotation, about 20 external rotation, 20 abduction with discomfort. The exam is normal bilaterally. Pulse, sensation, and motor are intact. She does have a significantly antalgic gait pattern on the left.  RADIOGRAPHS AP pelvis and lateral of the left hip shows severe end-stage bone on bone arthritis with subchondral cystic formation and osteophyte formation.   Assessment & Plan   Note:Surgical Plans: Left Total Hip Replacement - Anterior Approach  Disposition: Home  PCP: Dr. Cindee Lame - [facility]  Topical TXA  Anesthesia Issues: None  Patient was instructed on what medications to stop prior to surgery.  AVOID RIGHT ARM FOR BP'S OR IV'S  Arlee Muslim, PA-C

## 2016-07-17 NOTE — Anesthesia Preprocedure Evaluation (Signed)
Anesthesia Evaluation  Patient identified by MRN, date of birth, ID band Patient awake    Reviewed: Allergy & Precautions, NPO status , Patient's Chart, lab work & pertinent test results  Airway Mallampati: II  TM Distance: >3 FB Neck ROM: Full    Dental no notable dental hx.    Pulmonary neg pulmonary ROS,    Pulmonary exam normal breath sounds clear to auscultation       Cardiovascular hypertension, Pt. on medications Normal cardiovascular exam Rhythm:Regular Rate:Normal     Neuro/Psych negative neurological ROS  negative psych ROS   GI/Hepatic negative GI ROS, Neg liver ROS,   Endo/Other  Hypothyroidism   Renal/GU negative Renal ROS  negative genitourinary   Musculoskeletal negative musculoskeletal ROS (+)   Abdominal   Peds negative pediatric ROS (+)  Hematology negative hematology ROS (+)   Anesthesia Other Findings   Reproductive/Obstetrics negative OB ROS                             Anesthesia Physical Anesthesia Plan  ASA: II  Anesthesia Plan: Spinal   Post-op Pain Management:    Induction: Intravenous  Airway Management Planned: Nasal Cannula  Additional Equipment:   Intra-op Plan:   Post-operative Plan:   Informed Consent: I have reviewed the patients History and Physical, chart, labs and discussed the procedure including the risks, benefits and alternatives for the proposed anesthesia with the patient or authorized representative who has indicated his/her understanding and acceptance.   Dental advisory given  Plan Discussed with: CRNA and Surgeon  Anesthesia Plan Comments:         Anesthesia Quick Evaluation

## 2016-07-17 NOTE — Anesthesia Procedure Notes (Signed)
Spinal  Patient location during procedure: OR End time: 07/17/2016 11:54 AM Staffing Anesthesiologist: ROSE, Iona Beard Resident/CRNA: Maxwell Caul Performed: resident/CRNA  Preanesthetic Checklist Completed: patient identified, site marked, surgical consent, pre-op evaluation, timeout performed, IV checked, risks and benefits discussed and monitors and equipment checked Spinal Block Patient position: sitting Prep: DuraPrep Patient monitoring: heart rate, continuous pulse ox and blood pressure Approach: midline Location: L3-4 Injection technique: single-shot Needle Needle type: Spinocan  Needle gauge: 22 G Needle length: 9 cm Additional Notes Patient sitting for SAB placement. Dr Kalman Shan at bedside during entire spinal placement. Single shot, no heme or paresthesia noted. Patient tolerated well. LOT # [id number], Expiration date:12-26-2018.

## 2016-07-17 NOTE — Anesthesia Postprocedure Evaluation (Signed)
Anesthesia Post Note  Patient: Molly Lambert  Procedure(s) Performed: Procedure(s) (LRB): LEFT TOTAL HIP ARTHROPLASTY ANTERIOR APPROACH (Left)  Patient location during evaluation: PACU Anesthesia Type: Spinal Level of consciousness: oriented and awake and alert Pain management: pain level controlled Vital Signs Assessment: post-procedure vital signs reviewed and stable Respiratory status: spontaneous breathing, respiratory function stable and patient connected to nasal cannula oxygen Cardiovascular status: blood pressure returned to baseline and stable Postop Assessment: no headache and no backache Anesthetic complications: no       Last Vitals:  Vitals:   07/17/16 1430 07/17/16 1456  BP: 121/62 (!) 147/72  Pulse: 71 79  Resp: 17 16  Temp: 37 C 36.6 C    Last Pain:  Vitals:   07/17/16 1456  TempSrc:   PainSc: 7                  Yarelie Hams S

## 2016-07-17 NOTE — Op Note (Signed)
OPERATIVE REPORT- TOTAL HIP ARTHROPLASTY   PREOPERATIVE DIAGNOSIS: Osteoarthritis of the Left hip.   POSTOPERATIVE DIAGNOSIS: Osteoarthritis of the Left  hip.   PROCEDURE: Left total hip arthroplasty, anterior approach.   SURGEON: Gaynelle Arabian, MD   ASSISTANT: Arlee Muslim, PA-C  ANESTHESIA:  Spinal  ESTIMATED BLOOD LOSS:-300 ml    DRAINS: Hemovac x1.   COMPLICATIONS: None   CONDITION: PACU - hemodynamically stable.   BRIEF CLINICAL NOTE: Molly Lambert is a 82 y.o. female who has advanced end-  stage arthritis of their Left  hip with progressively worsening pain and  dysfunction.The patient has failed nonoperative management and presents for  total hip arthroplasty.   PROCEDURE IN DETAIL: After successful administration of spinal  anesthetic, the traction boots for the [facility] bed were placed on both  feet and the patient was placed onto the [facility] bed, boots placed into the leg  holders. The Left hip was then isolated from the perineum with plastic  drapes and prepped and draped in the usual sterile fashion. ASIS and  greater trochanter were marked and a oblique incision was made, starting  at about 1 cm lateral and 2 cm distal to the ASIS and coursing towards  the anterior cortex of the femur. The skin was cut with a 10 blade  through subcutaneous tissue to the level of the fascia overlying the  tensor fascia lata muscle. The fascia was then incised in line with the  incision at the junction of the anterior third and posterior 2/3rd. The  muscle was teased off the fascia and then the interval between the TFL  and the rectus was developed. The Hohmann retractor was then placed at  the top of the femoral neck over the capsule. The vessels overlying the  capsule were cauterized and the fat on top of the capsule was removed.  A Hohmann retractor was then placed anterior underneath the rectus  femoris to give exposure to the entire anterior capsule. A T-shaped   capsulotomy was performed. The edges were tagged and the femoral head  was identified.       Osteophytes are removed off the superior acetabulum.  The femoral neck was then cut in situ with an oscillating saw. Traction  was then applied to the left lower extremity utilizing the [facility]  traction. The femoral head was then removed. Retractors were placed  around the acetabulum and then circumferential removal of the labrum was  performed. Osteophytes were also removed. Reaming starts at 45 mm to  medialize and  Increased in 2 mm increments to 49 mm. We reamed in  approximately 40 degrees of abduction, 20 degrees anteversion. A 50 mm  pinnacle acetabular shell was then impacted in anatomic position under  fluoroscopic guidance with excellent purchase. We did not need to place  any additional dome screws. A 32 mm neutral + 4 marathon liner was then  placed into the acetabular shell.       The femoral lift was then placed along the lateral aspect of the femur  just distal to the vastus ridge. The leg was  externally rotated and capsule  was stripped off the inferior aspect of the femoral neck down to the  level of the lesser trochanter, this was done with electrocautery. The femur was lifted after this was performed. The  leg was then placed in an extended and adducted position essentially delivering the femur. We also removed the capsule superiorly and the piriformis from the piriformis  fossa to gain excellent exposure of the  proximal femur. Rongeur was used to remove some cancellous bone to get  into the lateral portion of the proximal femur for placement of the  initial starter reamer. The starter broaches was placed  the starter broach  and was shown to go down the center of the canal. Broaching  with the  Corail system was then performed starting at size 8, coursing  Up to size 10. A size 10 had excellent torsional and rotational  and axial stability. The trial high offset neck was then  placed  with a 32 + 1 trial head. The hip was then reduced. We confirmed that  the stem was in the canal both on AP and lateral x-rays. It also has excellent sizing. The hip was reduced with outstanding stability through full extension and full external rotation.. AP pelvis was taken and the leg lengths were measured and found to be equal. Hip was then dislocated again and the femoral head and neck removed. The  femoral broach was removed. Size 10 Corail stem with a high offset  neck was then impacted into the femur following native anteversion. Has  excellent purchase in the canal. Excellent torsional and rotational and  axial stability. It is confirmed to be in the canal on AP and lateral  fluoroscopic views. The 32 + 1 ceramic head was placed and the hip  reduced with outstanding stability. Again AP pelvis was taken and it  confirmed that the leg lengths were equal. The wound was then copiously  irrigated with saline solution and the capsule reattached and repaired  with Ethibond suture. 30 ml of .25% Bupivicaine was  injected into the capsule and into the edge of the tensor fascia lata as well as subcutaneous tissue. The fascia overlying the tensor fascia lata was then closed with a running #1 V-Loc. Subcu was closed with interrupted 2-0 Vicryl and subcuticular running 4-0 Monocryl. Incision was cleaned  and dried. Steri-Strips and a bulky sterile dressing applied. Hemovac  drain was hooked to suction and then the patient was awakened and transported to  recovery in stable condition.        Please note that a surgical assistant was a medical necessity for this procedure to perform it in a safe and expeditious manner. Assistant was necessary to provide appropriate retraction of vital neurovascular structures and to prevent femoral fracture and allow for anatomic placement of the prosthesis.  Gaynelle Arabian, M.D.

## 2016-07-17 NOTE — Interval H&P Note (Signed)
History and Physical Interval Note:  07/17/2016 11:46 AM  Molly Lambert  has presented today for surgery, with the diagnosis of Osteoarthritis Left Hip  The various methods of treatment have been discussed with the patient and family. After consideration of risks, benefits and other options for treatment, the patient has consented to  Procedure(s): LEFT TOTAL HIP ARTHROPLASTY ANTERIOR APPROACH (Left) as a surgical intervention .  The patient's history has been reviewed, patient examined, no change in status, stable for surgery.  I have reviewed the patient's chart and labs.  Questions were answered to the patient's satisfaction.     [name]

## 2016-07-17 NOTE — Discharge Instructions (Addendum)
Dr. Gaynelle Arabian Total Joint Specialist [facility] [address], [place], [place] [address] [phone]  ANTERIOR APPROACH TOTAL HIP REPLACEMENT POSTOPERATIVE DIRECTIONS   Hip Rehabilitation, Guidelines Following Surgery  The results of a hip operation are greatly improved after range of motion and muscle strengthening exercises. Follow all safety measures which are given to protect your hip. If any of these exercises cause increased pain or swelling in your joint, decrease the amount until you are comfortable again. Then slowly increase the exercises. Call your caregiver if you have problems or questions.   HOME CARE INSTRUCTIONS  Remove items at home which could result in a fall. This includes throw rugs or furniture in walking pathways.   ICE to the affected hip every three hours for 30 minutes at a time and then as needed for pain and swelling.  Continue to use ice on the hip for pain and swelling from surgery. You may notice swelling that will progress down to the foot and ankle.  This is normal after surgery.  Elevate the leg when you are not up walking on it.    Continue to use the breathing machine which will help keep your temperature down.  It is common for your temperature to cycle up and down following surgery, especially at night when you are not up moving around and exerting yourself.  The breathing machine keeps your lungs expanded and your temperature down.   DIET You may resume your previous home diet once your are discharged from the hospital.  DRESSING / WOUND CARE / SHOWERING You may start showering once you are discharged home but do not submerge the incision under water. Just pat the incision dry and apply a dry gauze dressing on daily. Change the surgical dressing daily and reapply a dry dressing each time.  ACTIVITY Walk with your walker as instructed. Use walker as long as suggested by your caregivers. Avoid periods of inactivity  such as sitting longer than an hour when not asleep. This helps prevent blood clots.  You may resume a sexual relationship in one month or when given the OK by your doctor.  You may return to work once you are cleared by your doctor.  Do not drive a car for 6 weeks or until released by you surgeon.  Do not drive while taking narcotics.  WEIGHT BEARING Weight bearing as tolerated with assist device (walker, cane, etc) as directed, use it as long as suggested by your surgeon or therapist, typically at least 4-6 weeks.  POSTOPERATIVE CONSTIPATION PROTOCOL Constipation - defined medically as fewer than three stools per week and severe constipation as less than one stool per week.  One of the most common issues patients have following surgery is constipation.  Even if you have a regular bowel pattern at home, your normal regimen is likely to be disrupted due to multiple reasons following surgery.  Combination of anesthesia, postoperative narcotics, change in appetite and fluid intake all can affect your bowels.  In order to avoid complications following surgery, here are some recommendations in order to help you during your recovery period.  Colace (docusate) - Pick up an over-the-counter form of Colace or another stool softener and take twice a day as long as you are requiring postoperative pain medications.  Take with a full glass of water daily.  If you experience loose stools or diarrhea, hold the colace until you stool forms back up.  If your symptoms do not get better within 1  week or if they get worse, check with your doctor. ° °Dulcolax (bisacodyl) - Pick up over-the-counter and take as directed by the product packaging as needed to assist with the movement of your bowels.  Take with a full glass of water.  Use this product as needed if not relieved by Colace only.  ° °MiraLax (polyethylene glycol) - Pick up over-the-counter to have on [place].  MiraLax is a solution that will increase the amount of  water in your bowels to assist with bowel movements.  Take as directed and can mix with a glass of water, juice, soda, coffee, or tea.  Take if you go more than two days without a movement. °Do not use MiraLax more than once per day. Call your doctor if you are still constipated or irregular after using this medication for 7 days in a row. ° °If you continue to have problems with postoperative constipation, please contact the office for further assistance and recommendations.  If you experience "the worst abdominal pain ever" or develop nausea or vomiting, please contact the office immediatly for further recommendations for treatment. ° °ITCHING ° If you experience itching with your medications, try taking only a single pain pill, or even half a pain pill at a time.  You can also use Benadryl over the counter for itching or also to help with sleep.  ° °TED HOSE STOCKINGS °Wear the elastic stockings on both legs for three weeks following surgery during the day but you may remove then at night for sleeping. ° °MEDICATIONS °See your medication summary on the “After Visit Summary” that the nursing staff will review with you prior to discharge.  You may have some home medications which will be placed on hold until you complete the course of blood thinner medication.  It is important for you to complete the blood thinner medication as prescribed by your surgeon.  Continue your approved medications as instructed at time of discharge. ° °PRECAUTIONS °If you experience chest pain or shortness of breath - call 911 immediately for transfer to the hospital emergency department.  °If you develop a fever greater that 101 F, purulent drainage from wound, increased redness or drainage from wound, foul odor from the wound/dressing, or calf pain - CONTACT YOUR SURGEON.   °                                                °FOLLOW-UP APPOINTMENTS °Make sure you keep all of your appointments after your operation with your surgeon and  caregivers. You should call the office at the above phone number and make an appointment for approximately two weeks after the date of your surgery or on the date instructed by your surgeon outlined in the "After Visit Summary". ° °RANGE OF MOTION AND STRENGTHENING EXERCISES  °These exercises are designed to help you keep full movement of your hip joint. Follow your caregiver's or physical therapist's instructions. Perform all exercises about fifteen times, three times per day or as directed. Exercise both hips, even if you have had only one joint replacement. These exercises can be done on a training (exercise) mat, on the floor, on a table or on a bed. Use whatever works the best and is most comfortable for you. Use music or television while you are exercising so that the exercises are a pleasant break in your day. This   will make your life better with the exercises acting as a break in routine you can look forward to.  Lying on your back, slowly slide your foot toward your buttocks, raising your knee up off the floor. Then slowly slide your foot back down until your leg is straight again.  Lying on your back spread your legs as far apart as you can without causing discomfort.  Lying on your side, raise your upper leg and foot straight up from the floor as far as is comfortable. Slowly lower the leg and repeat.  Lying on your back, tighten up the muscle in the front of your thigh (quadriceps muscles). You can do this by keeping your leg straight and trying to raise your heel off the floor. This helps strengthen the largest muscle supporting your knee.  Lying on your back, tighten up the muscles of your buttocks both with the legs straight and with the knee bent at a comfortable angle while keeping your heel on the floor.   IF YOU ARE TRANSFERRED TO A SKILLED REHAB FACILITY If the patient is transferred to a skilled rehab facility following release from the hospital, a list of the current medications will be  sent to the facility for the patient to continue.  When discharged from the skilled rehab facility, please have the facility set up the patient's [place] prior to being released. Also, the skilled facility will be responsible for providing the patient with their medications at time of release from the facility to include their pain medication, the muscle relaxants, and their blood thinner medication. If the patient is still at the rehab facility at time of the two week follow up appointment, the skilled rehab facility will also need to assist the patient in arranging follow up appointment in our office and any transportation needs.  MAKE SURE YOU:  Understand these instructions.  Get help right away if you are not doing well or get worse.    Pick up stool softner and laxative for home use following surgery while on pain medications. Do not submerge incision under water. Please use good Koffman washing techniques while changing dressing each day. May shower starting three days after surgery. Please use a clean towel to pat the incision dry following showers. Continue to use ice for pain and swelling after surgery. Do not use any lotions or creams on the incision until instructed by your surgeon.    Information on my medicine - XARELTO (Rivaroxaban)  This medication education was reviewed with me or my healthcare representative as part of my discharge preparation.  The pharmacist that spoke with me during my hospital stay was:    Why was Xarelto prescribed for you? Xarelto was prescribed for you to reduce the risk of blood clots forming after orthopedic surgery. The medical term for these abnormal blood clots is venous thromboembolism (VTE).  What do you need to know about xarelto ? Take your Xarelto ONCE DAILY at the same time every day. You may take it either with or without food.  If you have difficulty swallowing the tablet whole, you may crush it and mix in  applesauce just prior to taking your dose.  Take Xarelto exactly as prescribed by your doctor and DO NOT stop taking Xarelto without talking to the doctor who prescribed the medication.  Stopping without other VTE prevention medication to take the place of Xarelto may increase your risk of developing a clot.  After discharge, you should have regular check-up  appointments with your healthcare provider that is prescribing your Xarelto.    What do you do if you miss a dose? If you miss a dose, take it as soon as you remember on the same day then continue your regularly scheduled once daily regimen the next day. Do not take two doses of Xarelto on the same day.   Important Safety Information A possible side effect of Xarelto is bleeding. You should call your healthcare provider right away if you experience any of the following: ? Bleeding from an injury or your nose that does not stop. ? Unusual colored urine (red or dark brown) or unusual colored stools (red or black). ? Unusual bruising for unknown reasons. ? A serious fall or if you hit your head (even if there is no bleeding).  Some medicines may interact with Xarelto and might increase your risk of bleeding while on Xarelto. To help avoid this, consult your healthcare provider or pharmacist prior to using any new prescription or non-prescription medications, including herbals, vitamins, non-steroidal anti-inflammatory drugs (NSAIDs) and supplements.  This website has more information on Xarelto: [email or web address]

## 2016-07-18 ENCOUNTER — Encounter (HOSPITAL_COMMUNITY): Payer: Self-pay | Admitting: Orthopedic Surgery

## 2016-07-18 LAB — BASIC METABOLIC PANEL
ANION GAP: 11 (ref 5–15)
BUN: 16 mg/dL (ref 6–20)
CALCIUM: 8.8 mg/dL — AB (ref 8.9–10.3)
CHLORIDE: 99 mmol/L — AB (ref 101–111)
CO2: 26 mmol/L (ref 22–32)
Creatinine, Ser: 0.72 mg/dL (ref 0.44–1.00)
GFR calc non Af Amer: >60 mL/min (ref >60)
Glucose, Bld: 143 mg/dL — ABNORMAL HIGH (ref 65–99)
POTASSIUM: 3.3 mmol/L — AB (ref 3.5–5.1)
Sodium: 136 mmol/L (ref 135–145)

## 2016-07-18 LAB — CBC
HEMATOCRIT: 32 % — AB (ref 36.0–46.0)
HEMOGLOBIN: 10.9 g/dL — AB (ref 12.0–15.0)
MCH: 31 pg (ref 26.0–34.0)
MCHC: 34.1 g/dL (ref 30.0–36.0)
MCV: 90.9 fL (ref 78.0–100.0)
Platelets: 270 10*3/uL (ref 150–400)
RBC: 3.52 MIL/uL — ABNORMAL LOW (ref 3.87–5.11)
RDW: 12.8 % (ref 11.5–15.5)
WBC: 11.7 10*3/uL — AB (ref 4.0–10.5)

## 2016-07-18 MED ORDER — TRAMADOL HCL 50 MG PO TABS: 50.0000 mg | ORAL_TABLET | Freq: Four times a day (QID) | ORAL | 1 refills | Status: DC | PRN

## 2016-07-18 MED ORDER — RIVAROXABAN 10 MG PO TABS: 10.0000 mg | ORAL_TABLET | Freq: Every day | ORAL | 0 refills | Status: DC

## 2016-07-18 MED ORDER — POTASSIUM CHLORIDE CRYS ER 20 MEQ PO TBCR
40.0000 meq | EXTENDED_RELEASE_TABLET | ORAL | Status: AC
  Administered 2016-07-18: 40 meq via ORAL
  Filled 2016-07-18: qty 2

## 2016-07-18 MED ORDER — METHOCARBAMOL 500 MG PO TABS: 500.0000 mg | ORAL_TABLET | Freq: Four times a day (QID) | ORAL | 1 refills | Status: DC | PRN

## 2016-07-18 MED ORDER — OXYCODONE HCL 5 MG PO TABS: 5.0000 mg | ORAL_TABLET | ORAL | 0 refills | Status: DC | PRN

## 2016-07-18 NOTE — Evaluation (Signed)
Physical Therapy Evaluation Patient Details Name: Molly Lambert MRN: [id number] DOB: 1934-03-27 Today's Date: 07/18/2016   History of Present Illness  s/p L DA THA  Clinical Impression  Pt s/p L THR and presents with decreased L LE strength/ROM and post op pain limiting functional mobility.  Pt should progress to dc home with family assist.    Follow Up Recommendations DC plan and follow up therapy as arranged by surgeon    Equipment Recommendations  None recommended by PT    Recommendations for Other Services OT consult     Precautions / Restrictions Precautions Precautions: Fall Restrictions Weight Bearing Restrictions: No      Mobility  Bed Mobility Overal bed mobility: Needs Assistance Bed Mobility: Supine to Sit     Supine to sit: Min assist     General bed mobility comments: cues for sequence and use of R LE to self assist  Transfers Overall transfer level: Needs assistance Equipment used: Rolling walker (2 wheeled) Transfers: Sit to/from Stand Sit to Stand: Min assist         General transfer comment: cues for LE management and use of UEs to self assist  Ambulation/Gait Ambulation/Gait assistance: Min assist;Min guard Ambulation Distance (Feet): 123 Feet Assistive device: Rolling walker (2 wheeled) Gait Pattern/deviations: Step-to pattern;Step-through pattern;Decreased step length - right;Decreased step length - left;Shuffle;Trunk flexed Gait velocity: decr Gait velocity interpretation: Below normal speed for age/gender General Gait Details: cues for posture, position from RW and initial sequence  Stairs            Wheelchair Mobility    Modified Rankin (Stroke Patients Only)       Balance                                             Pertinent Vitals/Pain Pain Assessment: 0-10 Pain Score: 2  Pain Location: L hip Pain Descriptors / Indicators: Sore Pain Intervention(s): Limited activity within patient's  tolerance;Monitored during session;Premedicated before session;Ice applied    Home Living Family/patient expects to be discharged to:: Private residence Living Arrangements: Children Available Help at Discharge: Family Type of Home: House Home Access: Stairs to enter Entrance Stairs-Rails: Right Entrance Stairs-Number of Steps: 3 Home Layout: One level Home Equipment: Environmental consultant - 2 wheels;Cane - single point Additional Comments: husband can help a little; he has dementia.  will have 24/7    Prior Function Level of Independence: Independent               [place] Dominance        Extremity/Trunk Assessment   Upper Extremity Assessment Upper Extremity Assessment: Overall WFL for tasks assessed    Lower Extremity Assessment Lower Extremity Assessment: LLE deficits/detail LLE Deficits / Details: Strength at L hip 2+/5 with AAROM at hip to 90 flex and 15 abd    Cervical / Trunk Assessment Cervical / Trunk Assessment: Normal  Communication   Communication: No difficulties  Cognition Arousal/Alertness: Awake/alert Behavior During Therapy: WFL for tasks assessed/performed Overall Cognitive Status: Within Functional Limits for tasks assessed                                        General Comments      Exercises Total Joint Exercises Ankle Circles/Pumps: AROM;Both;20 reps;Supine Quad Sets: AROM;Both;10 reps;Supine Heel  Slides: AAROM;Left;20 reps;Supine Hip ABduction/ADduction: AAROM;Left;15 reps;Supine   Assessment/Plan    PT Assessment Patient needs continued PT services  PT Problem List Decreased strength;Decreased range of motion;Decreased activity tolerance;Decreased mobility;Decreased knowledge of use of DME;Pain       PT Treatment Interventions DME instruction;Gait training;Stair training;Functional mobility training;Therapeutic activities;Therapeutic exercise;Patient/family education    PT Goals (Current goals can be found in the Care Plan  section)  Acute Rehab PT Goals Patient Stated Goal: return to independence; drive PT Goal Formulation: With patient Time For Goal Achievement: 07/22/16 Potential to Achieve Goals: Good    Frequency 7X/week   Barriers to discharge        Co-evaluation               AM-PAC PT "6 Clicks" Daily Activity  Outcome Measure Difficulty turning over in bed (including adjusting bedclothes, sheets and blankets)?: A Little Difficulty moving from lying on back to sitting on the side of the bed? : A Little Difficulty sitting down on and standing up from a chair with arms (e.g., wheelchair, bedside commode, etc,.)?: A Little Help needed moving to and from a bed to chair (including a wheelchair)?: A Little Help needed walking in hospital room?: A Little Help needed climbing 3-5 steps with a railing? : A Little 6 Click Score: 18    End of Session Equipment Utilized During Treatment: Gait belt Activity Tolerance: Patient tolerated treatment well Patient left: in chair;with call bell/phone within reach Nurse Communication: Mobility status PT Visit Diagnosis: Difficulty in walking, not elsewhere classified (R26.2)    Time: 0981-1914 PT Time Calculation (min) (ACUTE ONLY): 30 min   Charges:   PT Evaluation $PT Eval Low Complexity: 1 Procedure PT Treatments $Therapeutic Exercise: 8-22 mins   PT G Codes:        Pg [phone]   Kirrah Mustin 07/18/2016, 12:58 PM

## 2016-07-18 NOTE — Evaluation (Signed)
Occupational Therapy Evaluation Patient Details Name: Molly Lambert MRN: [id number] DOB: 1934-05-17 Today's Date: 07/18/2016    History of Present Illness s/p L DA THA   Clinical Impression   This 82 year old female was admitted for the above sx. All education was completed. No further OT is needed at this time    Follow Up Recommendations  Supervision/Assistance - 24 hour    Equipment Recommendations  None recommended by OT    Recommendations for Other Services       Precautions / Restrictions Precautions Precautions: Fall Restrictions Weight Bearing Restrictions: No      Mobility Bed Mobility                  Transfers Overall transfer level: Needs assistance Equipment used: Rolling walker (2 wheeled) Transfers: Sit to/from Stand Sit to Stand: Min guard         General transfer comment: for safety    Balance                                           ADL either performed or assessed with clinical judgement   ADL Overall ADL's : Needs assistance/impaired Eating/Feeding: Set up;Sitting   Grooming: Supervision/safety;Standing;Wash/dry hands   Upper Body Bathing: Set up;Sitting   Lower Body Bathing: Minimal assistance;Sit to/from stand   Upper Body Dressing : Set up;Sitting   Lower Body Dressing: Moderate assistance;Sit to/from stand   Toilet Transfer: Min guard;Ambulation;BSC;RW   Toileting- Water quality scientist and Hygiene: Min guard;Sit to/from stand         General ADL Comments: performed ADL in bathroom.  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  Husband or son could help wtih socks and pants if she needs it. She has a tub and has been kneeling on side to get in.  Recommended she sponge bathes until she can step over:  demonstrated this for her. She is agreeable for this. She does have a seat in tub.  Husband can set her up to make sandwiches. Reviewed keeping walker in front of her if she needs to retrieve something  (and in general when up and standing)     Vision         Perception     Praxis      Pertinent Vitals/Pain Pain Assessment: 0-10 Pain Score: 1  Pain Location: L hip Pain Descriptors / Indicators: Sore Pain Intervention(s): Limited activity within patient's tolerance;Monitored during session;Premedicated before session;Repositioned;Heat applied     Panagopoulos Dominance     Extremity/Trunk Assessment Upper Extremity Assessment Upper Extremity Assessment: Overall WFL for tasks assessed           Communication Communication Communication: No difficulties   Cognition Arousal/Alertness: Awake/alert Behavior During Therapy: WFL for tasks assessed/performed Overall Cognitive Status: Within Functional Limits for tasks assessed                                     General Comments       Exercises     Shoulder Instructions      Home Living Family/patient expects to be discharged to:: Private residence Living Arrangements: Children Available Help at Discharge: Family               Bathroom Shower/Tub: Teacher, early years/pre: Standard  Home Equipment: Shower seat;Grab bars - tub/shower;Bedside commode   Additional Comments: husband can help a little; he has dementia.  will have 24/7      Prior Functioning/Environment Level of Independence: Independent                 OT Problem List:        OT Treatment/Interventions:      OT Goals(Current goals can be found in the care plan section) Acute Rehab OT Goals Patient Stated Goal: return to independence; drive OT Goal Formulation: All assessment and education complete, DC therapy  OT Frequency:     Barriers to D/C:            Co-evaluation              AM-PAC PT "6 Clicks" Daily Activity     Outcome Measure Help from another person eating meals?: None Help from another person taking care of personal grooming?: A Little Help from another person toileting, which  includes using toliet, bedpan, or urinal?: A Little Help from another person bathing (including washing, rinsing, drying)?: A Little Help from another person to put on and taking off regular upper body clothing?: A Little Help from another person to put on and taking off regular lower body clothing?: A Lot 6 Click Score: 18   End of Session    Activity Tolerance: No increased pain Patient left: in chair;with call bell/phone within reach  OT Visit Diagnosis: Pain Pain - Right/Left: Left Pain - part of body: Hip                Time: 8309-4076 OT Time Calculation (min): 20 min Charges:  OT General Charges $OT Visit: 1 Procedure OT Evaluation $OT Eval Low Complexity: 1 Procedure G-Codes:     Lesle Chris, OTR/L [phone] 07/18/2016  Hiroko Tregre 07/18/2016, 9:57 AM

## 2016-07-18 NOTE — Progress Notes (Signed)
Physical Therapy Treatment Patient Details Name: Molly Lambert MRN: [id number] DOB: 05/20/1934 Today's Date: 07/18/2016    History of Present Illness s/p L DA THA    PT Comments    Pt progressing well with mobility and eager for return home.  Reviewed car transfers and stairs with written instruction provided.   Follow Up Recommendations  DC plan and follow up therapy as arranged by surgeon     Equipment Recommendations  None recommended by PT    Recommendations for Other Services OT consult     Precautions / Restrictions Precautions Precautions: Fall Restrictions Weight Bearing Restrictions: No    Mobility  Bed Mobility Overal bed mobility: Needs Assistance Bed Mobility: Supine to Sit     Supine to sit: Min assist     General bed mobility comments: Pt OOB and requests back to chair  Transfers Overall transfer level: Needs assistance Equipment used: Rolling walker (2 wheeled) Transfers: Sit to/from Stand Sit to Stand: Min guard;Supervision         General transfer comment: min cues for LE management and use of UEs to self assist  Ambulation/Gait Ambulation/Gait assistance: Min guard;Supervision Ambulation Distance (Feet): 140 Feet Assistive device: Rolling walker (2 wheeled) Gait Pattern/deviations: Step-to pattern;Step-through pattern;Decreased step length - right;Decreased step length - left;Shuffle;Trunk flexed Gait velocity: decr Gait velocity interpretation: Below normal speed for age/gender General Gait Details: cues for posture, position from RW and initial sequence   Stairs Stairs: Yes   Stair Management: One rail Left;Step to pattern;Forwards;With cane Number of Stairs: 8 General stair comments: 3+3+2 stairs with rail, cane and cues for sequence and foot/cane placement  Wheelchair Mobility    Modified Rankin (Stroke Patients Only)       Balance                                            Cognition  Arousal/Alertness: Awake/alert Behavior During Therapy: WFL for tasks assessed/performed Overall Cognitive Status: Within Functional Limits for tasks assessed                                        Exercises Total Joint Exercises Ankle Circles/Pumps: AROM;Both;20 reps;Supine Quad Sets: AROM;Both;10 reps;Supine Heel Slides: AAROM;Left;20 reps;Supine Hip ABduction/ADduction: AAROM;Left;15 reps;Supine    General Comments        Pertinent Vitals/Pain Pain Assessment: 0-10 Pain Score: 2  Pain Location: L hip Pain Descriptors / Indicators: Sore Pain Intervention(s): Limited activity within patient's tolerance;Monitored during session;Premedicated before session;Ice applied    Home Living Family/patient expects to be discharged to:: Private residence Living Arrangements: Children Available Help at Discharge: Family Type of Home: House Home Access: Stairs to enter Entrance Stairs-Rails: Right Home Layout: One level Home Equipment: Environmental consultant - 2 wheels;Cane - single point Additional Comments: husband can help a little; he has dementia.  will have 24/7    Prior Function Level of Independence: Independent          PT Goals (current goals can now be found in the care plan section) Acute Rehab PT Goals Patient Stated Goal: return to independence; drive PT Goal Formulation: With patient Time For Goal Achievement: 07/22/16 Potential to Achieve Goals: Good Progress towards PT goals: Progressing toward goals    Frequency    7X/week      PT  Plan Current plan remains appropriate    Co-evaluation              AM-PAC PT "6 Clicks" Daily Activity  Outcome Measure  Difficulty turning over in bed (including adjusting bedclothes, sheets and blankets)?: A Little Difficulty moving from lying on back to sitting on the side of the bed? : A Little Difficulty sitting down on and standing up from a chair with arms (e.g., wheelchair, bedside commode, etc,.)?: A  Little Help needed moving to and from a bed to chair (including a wheelchair)?: A Little Help needed walking in hospital room?: A Little Help needed climbing 3-5 steps with a railing? : A Little 6 Click Score: 18    End of Session Equipment Utilized During Treatment: Gait belt Activity Tolerance: Patient tolerated treatment well Patient left: in chair;with call bell/phone within reach Nurse Communication: Mobility status PT Visit Diagnosis: Difficulty in walking, not elsewhere classified (R26.2)     Time: 1350-1415 PT Time Calculation (min) (ACUTE ONLY): 25 min  Charges:  $Gait Training: 8-22 mins $Therapeutic Exercise: 8-22 mins $Therapeutic Activity: 8-22 mins                    G Codes:       Pg [phone]    Aura Bibby 07/18/2016, 3:30 PM

## 2016-07-18 NOTE — Progress Notes (Addendum)
Discharge planning, spoke with patient and spouse at bedside. Have chosen [facility] for HH PT. Contacted [facility] for referral. Has RW and 3n1. [phone] 

## 2016-07-18 NOTE — Progress Notes (Signed)
Patient and family upset about discharge timing and the length of time that staff is taking to discharge patient.   Explained to patient and family that we discharge based on when PT is completed, and all PT goals have been met.   Also, RN and charge RN had to call MD office to get discharge orders completed because attending MD wrote in note from this AM that she was a possible discharge for this afternoon.   Rn attempted to clarify with family, but they continued to call staff, and not understand the process of discharge.

## 2016-07-18 NOTE — Progress Notes (Signed)
   Subjective: 1 Day Post-Op Procedure(s) (LRB): LEFT TOTAL HIP ARTHROPLASTY ANTERIOR APPROACH (Left) Patient reports pain as mild.   We will start therapy today.  Plan is to go Home after hospital stay.  Objective: Vital signs in last 24 hours: Temp:  [97.3 F (36.3 C)-98.6 F (37 C)] 97.7 F (36.5 C) (05/24 0605) Pulse Rate:  [66-90] 75 (05/24 0605) Resp:  [16-23] 16 (05/24 0605) BP: (94-180)/(52-82) 118/58 (05/24 0605) SpO2:  [94 %-100 %] 98 % (05/24 0605) Weight:  [76.2 kg (168 lb)] 76.2 kg (168 lb) (05/23 1033)  Intake/Output from previous day:  Intake/Output Summary (Last 24 hours) at 07/18/16 0716 Last data filed at 07/18/16 0605  Gross per 24 hour  Intake             3095 ml  Output             2500 ml  Net              595 ml    Intake/Output this shift: No intake/output data recorded.  Labs:  Recent Labs  07/18/16 0411  HGB 10.9*    Recent Labs  07/18/16 0411  WBC 11.7*  RBC 3.52*  HCT 32.0*  PLT 270    Recent Labs  07/18/16 0411  NA 136  K 3.3*  CL 99*  CO2 26  BUN 16  CREATININE 0.72  GLUCOSE 143*  CALCIUM 8.8*   No results for input(s): LABPT, INR in the last 72 hours.  EXAM General - Patient is Alert, Appropriate and Oriented Extremity - Neurologically intact Neurovascular intact No cellulitis present Compartment soft Dressing - dressing C/D/I Motor Function - intact, moving foot and toes well on exam.  Hemovac pulled without difficulty.  Past Medical History:  Diagnosis Date  . Arthritis   . Breast cancer ([place]) 11/2010   Rt breast  . Cataract   . Erythema    reddened area of epidermis spnning concentrated around sinuses on the face; pt reports cracking, itchiness, and flaking  of affected skin; seen derm no absolute dx, will get 2nd opinion   . Hypercholesteremia   . Hypertension    NO CARDIAC MD,  [facility]  PCP  ,  URGENT MED  . Hypothyroidism   . Leg laceration    WITH SURGERY  . Paget's carcinoma of the nipple,  right (HCC)    nipple removed  . S/P radiation therapy 03/14/11 - 04/05/11   Right Breast / 4256 cGy in 16 Fractions  . Thyroid disease     Assessment/Plan: 1 Day Post-Op Procedure(s) (LRB): LEFT TOTAL HIP ARTHROPLASTY ANTERIOR APPROACH (Left) Principal Problem:   OA (osteoarthritis) of hip   Advance diet Up with therapy D/C IV fluids Plan for discharge tomorrow Possible discharge today if doing well and meets all therapy goals  DVT Prophylaxis - Xarelto Weight Bearing As Tolerated left Leg Hemovac Pulled Begin Therapy  [name]

## 2016-07-19 NOTE — Progress Notes (Signed)
Faxed AVS  to [facility] attention: Anderson Malta

## 2016-07-29 NOTE — Anesthesia Postprocedure Evaluation (Signed)
Anesthesia Post Note  Patient: Molly Lambert  Procedure(s) Performed: Procedure(s) (LRB): LEFT TOTAL HIP ARTHROPLASTY ANTERIOR APPROACH (Left)     Anesthesia Post Evaluation  Last Vitals:  Vitals:   07/18/16 1015 07/18/16 1320  BP: 122/60 (!) 124/55  Pulse: 76 73  Resp: 16 16  Temp: 36.3 C 36.5 C    Last Pain:  Vitals:   07/18/16 1630  TempSrc:   PainSc: 3                  Edris Schneck S

## 2016-07-29 NOTE — Addendum Note (Signed)
Addendum  created 07/29/16 1447 by Myrtie Soman, MD   Sign clinical note

## 2016-08-05 NOTE — Discharge Summary (Signed)
Physician Discharge Summary   Patient ID: Molly Lambert MRN: [id number] DOB/AGE: 07/13/1934 82 y.o.  Admit date: 07/17/2016 Discharge date: 07/18/2016  Primary Diagnosis:  Osteoarthritis of the Left hip.   Admission Diagnoses:  Past Medical History:  Diagnosis Date  . Arthritis   . Breast cancer ([place]) 11/2010   Rt breast  . Cataract   . Erythema    reddened area of epidermis spnning concentrated around sinuses on the face; pt reports cracking, itchiness, and flaking  of affected skin; seen derm no absolute dx, will get 2nd opinion   . Hypercholesteremia   . Hypertension    NO CARDIAC MD,  [facility]  PCP  ,  URGENT MED  . Hypothyroidism   . Leg laceration    WITH SURGERY  . Paget's carcinoma of the nipple, right (HCC)    nipple removed  . S/P radiation therapy 03/14/11 - 04/05/11   Right Breast / 4256 cGy in 16 Fractions  . Thyroid disease    Discharge Diagnoses:   Principal Problem:   OA (osteoarthritis) of hip  Estimated body mass index is 28.39 kg/m as calculated from the following:   Height as of this encounter: 5' 4.5" (1.638 m).   Weight as of this encounter: 76.2 kg (168 lb).  Procedure(s) (LRB): LEFT TOTAL HIP ARTHROPLASTY ANTERIOR APPROACH (Left)   Consults: None  HPI: Molly Lambert is a 82 y.o. female who has advanced end-  stage arthritis of their Left  hip with progressively worsening pain and  dysfunction.The patient has failed nonoperative management and presents for  total hip arthroplasty.   Laboratory Data: Admission on 07/17/2016, Discharged on 07/18/2016  Component Date Value Ref Range Status  . WBC 07/18/2016 11.7* 4.0 - 10.5 K/uL Final  . RBC 07/18/2016 3.52* 3.87 - 5.11 MIL/uL Final  . Hemoglobin 07/18/2016 10.9* 12.0 - 15.0 g/dL Final  . HCT 07/18/2016 32.0* 36.0 - 46.0 % Final  . MCV 07/18/2016 90.9  78.0 - 100.0 fL Final  . MCH 07/18/2016 31.0  26.0 - 34.0 pg Final  . MCHC 07/18/2016 34.1  30.0 - 36.0 g/dL Final  . RDW 07/18/2016 12.8   11.5 - 15.5 % Final  . Platelets 07/18/2016 270  150 - 400 K/uL Final  . Sodium 07/18/2016 136  135 - 145 mmol/L Final  . Potassium 07/18/2016 3.3* 3.5 - 5.1 mmol/L Final  . Chloride 07/18/2016 99* 101 - 111 mmol/L Final  . CO2 07/18/2016 26  22 - 32 mmol/L Final  . Glucose, Bld 07/18/2016 143* 65 - 99 mg/dL Final  . BUN 07/18/2016 16  6 - 20 mg/dL Final  . Creatinine, Ser 07/18/2016 0.72  0.44 - 1.00 mg/dL Final  . Calcium 07/18/2016 8.8* 8.9 - 10.3 mg/dL Final  . GFR calc non Af Amer 07/18/2016 >60  >60 mL/min Final  . GFR calc Af Amer 07/18/2016 >60  >60 mL/min Final   Comment: (NOTE) The eGFR has been calculated using the CKD EPI equation. This calculation has not been validated in all clinical situations. eGFR's persistently <60 mL/min signify possible Chronic Kidney Disease.   . Anion gap 07/18/2016 11  5 - 15 Final  Office Visit on 07/11/2016  Component Date Value Ref Range Status  . Glucose 07/11/2016 98  65 - 99 mg/dL Final  . BUN 07/11/2016 25  8 - 27 mg/dL Final  . Creatinine, Ser 07/11/2016 0.86  0.57 - 1.00 mg/dL Final  . GFR calc non Af Amer 07/11/2016 64  >59  mL/min/1.73 Final  . GFR calc Af Amer 07/11/2016 74  >59 mL/min/1.73 Final  . BUN/Creatinine Ratio 07/11/2016 29* 12 - 28 Final  . Sodium 07/11/2016 140  134 - 144 mmol/L Final  . Potassium 07/11/2016 3.6  3.5 - 5.2 mmol/L Final  . Chloride 07/11/2016 96  96 - 106 mmol/L Final  . CO2 07/11/2016 29  18 - 29 mmol/L Final  . Calcium 07/11/2016 9.6  8.7 - 10.3 mg/dL Final  Hospital Outpatient Visit on 07/10/2016  Component Date Value Ref Range Status  . aPTT 07/10/2016 26  24 - 36 seconds Final  . WBC 07/10/2016 5.5  4.0 - 10.5 K/uL Final  . RBC 07/10/2016 4.17  3.87 - 5.11 MIL/uL Final  . Hemoglobin 07/10/2016 13.1  12.0 - 15.0 g/dL Final  . HCT 07/10/2016 38.7  36.0 - 46.0 % Final  . MCV 07/10/2016 92.8  78.0 - 100.0 fL Final  . MCH 07/10/2016 31.4  26.0 - 34.0 pg Final  . MCHC 07/10/2016 33.9  30.0 - 36.0  g/dL Final  . RDW 07/10/2016 12.8  11.5 - 15.5 % Final  . Platelets 07/10/2016 325  150 - 400 K/uL Final  . Sodium 07/10/2016 139  135 - 145 mmol/L Final  . Potassium 07/10/2016 3.1* 3.5 - 5.1 mmol/L Final  . Chloride 07/10/2016 99* 101 - 111 mmol/L Final  . CO2 07/10/2016 30  22 - 32 mmol/L Final  . Glucose, Bld 07/10/2016 109* 65 - 99 mg/dL Final  . BUN 07/10/2016 25* 6 - 20 mg/dL Final  . Creatinine, Ser 07/10/2016 0.92  0.44 - 1.00 mg/dL Final  . Calcium 07/10/2016 9.5  8.9 - 10.3 mg/dL Final  . Total Protein 07/10/2016 7.5  6.5 - 8.1 g/dL Final  . Albumin 07/10/2016 4.0  3.5 - 5.0 g/dL Final  . AST 07/10/2016 18  15 - 41 U/L Final  . ALT 07/10/2016 16  14 - 54 U/L Final  . Alkaline Phosphatase 07/10/2016 86  38 - 126 U/L Final  . Total Bilirubin 07/10/2016 0.8  0.3 - 1.2 mg/dL Final  . GFR calc non Af Amer 07/10/2016 57* >60 mL/min Final  . GFR calc Af Amer 07/10/2016 >60  >60 mL/min Final   Comment: (NOTE) The eGFR has been calculated using the CKD EPI equation. This calculation has not been validated in all clinical situations. eGFR's persistently <60 mL/min signify possible Chronic Kidney Disease.   . Anion gap 07/10/2016 10  5 - 15 Final  . Prothrombin Time 07/10/2016 13.0  11.4 - 15.2 seconds Final  . INR 07/10/2016 0.98   Final  . ABO/RH(D) 07/10/2016 AB POS   Final  . Antibody Screen 07/10/2016 NEG   Final  . Sample Expiration 07/10/2016 07/20/2016   Final  . Extend sample reason 07/10/2016 NO TRANSFUSIONS OR PREGNANCY IN THE PAST 3 MONTHS   Final  . MRSA, PCR 07/10/2016 NEGATIVE  NEGATIVE Final  . Staphylococcus aureus 07/10/2016 POSITIVE* NEGATIVE Final   Comment:        The Xpert SA Assay (FDA approved for NASAL specimens in patients over 43 years of age), is one component of a comprehensive surveillance program.  Test performance has been validated by [facility] for patients greater than or equal to 36 year old. It is not intended to diagnose infection nor  to guide or monitor treatment.   . ABO/RH(D) 07/10/2016 AB POS   Final     X-Rays:Dg Pelvis Portable  Result Date: 07/17/2016 CLINICAL DATA:  Left hip replacement. EXAM: PORTABLE PELVIS 1-2 VIEWS COMPARISON:  None. FINDINGS: Left hip total arthroplasty is noted. Alignment is anatomic on a single AP view. A drain is in place. The pelvis otherwise intact. Degenerative changes are again noted in the lower lumbar spine and SI joints. IMPRESSION: 1. Left total hip arthroplasty without radiographic evidence for complication. Electronically Signed   By: San Morelle M.D.   On: 07/17/2016 14:16   Dg C-arm 1-60 Min-no Report  Result Date: 07/17/2016 Fluoroscopy was utilized by the requesting physician.  No radiographic interpretation.    EKG: Orders placed or performed during the hospital encounter of 07/17/16  . EKG 12-Lead  . EKG 12-Lead     Hospital Course: Patient was admitted to [facility] and taken to the OR and underwent the above state procedure without complications.  Patient tolerated the procedure well and was later transferred to the recovery room and then to the orthopaedic floor for postoperative care.  They were given PO and IV analgesics for pain control following their surgery.  They were given 24 hours of postoperative antibiotics of  Anti-infectives    Start     Dose/Rate Route Frequency Ordered Stop   07/17/16 1800  ceFAZolin (ANCEF) IVPB 2g/100 mL premix     2 g 200 mL/hr over 30 Minutes Intravenous Every 6 hours 07/17/16 1502 07/18/16 0010   07/17/16 0956  ceFAZolin (ANCEF) 2-4 GM/100ML-% IVPB    Comments:  [place] Session   : cabinet override      07/17/16 8786 07/17/16 1156   07/17/16 0953  ceFAZolin (ANCEF) IVPB 2g/100 mL premix     2 g 200 mL/hr over 30 Minutes Intravenous On call to O.R. 07/17/16 7672 07/17/16 1216     and started on DVT prophylaxis in the form of Xarelto.   PT and OT were ordered for total hip protocol.  The patient was allowed  to be WBAT with therapy. Discharge planning was consulted to help with postop disposition and equipment needs.  Patient had a good night on the evening of surgery.  They started to get up OOB with therapy on day one and walked over 120 feet twice.  Hemovac drain was pulled without difficulty. Patient was seen in rounds and setup to go home later that afternoon.  She progress with therapy and was ready to go home.  Diet: Cardiac diet Activity:WBAT Follow-up:in 2 weeks Disposition - Home Discharged Condition: good    Allergies as of 07/18/2016   No Known Allergies     Medication List    STOP taking these medications   hydrocortisone 1 % lotion   ibuprofen 200 MG tablet Commonly known as:  ADVIL,MOTRIN   naproxen sodium 220 MG tablet Commonly known as:  ANAPROX     TAKE these medications   acetaminophen 500 MG tablet Commonly known as:  TYLENOL Take 1,500 mg by mouth 2 (two) times daily. Takes 3 tablets at a time   amLODipine 5 MG tablet Commonly known as:  NORVASC Take 1 tablet (5 mg total) by mouth daily.   chlorthalidone 25 MG tablet Commonly known as:  HYGROTON Take 25 mg by mouth daily.   ipratropium 0.03 % nasal spray Commonly known as:  ATROVENT Place 2 sprays into both nostrils 2 (two) times daily. As needed   levothyroxine 50 MCG tablet Commonly known as:  SYNTHROID, LEVOTHROID Take 1 tablet (50 mcg total) by mouth daily before breakfast.   methocarbamol 500 MG tablet Commonly known as:  ROBAXIN Take 1 tablet (500 mg total) by mouth every 6 (six) hours as needed for muscle spasms.   metroNIDAZOLE 0.75 % gel Commonly known as:  METROGEL Apply 1 application topically 2 (two) times daily.   oxyCODONE 5 MG immediate release tablet Commonly known as:  Oxy IR/ROXICODONE Take 1-2 tablets (5-10 mg total) by mouth every 3 (three) hours as needed for breakthrough pain.   rivaroxaban 10 MG Tabs tablet Commonly known as:  XARELTO Take 1 tablet (10 mg total) by  mouth daily with breakfast.   simvastatin 20 MG tablet Commonly known as:  ZOCOR Take 1 tablet (20 mg total) by mouth daily.   traMADol 50 MG tablet Commonly known as:  ULTRAM Take 1-2 tablets (50-100 mg total) by mouth every 6 (six) hours as needed for moderate pain.      Follow-up Information    Gaynelle Arabian, MD. Schedule an appointment as soon as possible for a visit on 07/30/2016.   Specialty:  Orthopedic Surgery Why:  Call [phone] tomorrow to make the appointment Contact information: [address] [place] [address] [phone]        [facility] Follow up.   Specialty:  [place] Why:  physical therapy Contact information: [address] [place] [address] [phone]           Signed: Arlee Muslim, PA-C Orthopaedic Surgery 08/05/2016, 7:41 AM

## 2016-09-09 ENCOUNTER — Other Ambulatory Visit: Payer: Self-pay | Admitting: Family Medicine

## 2016-09-09 DIAGNOSIS — I10 Essential (primary) hypertension: Secondary | ICD-10-CM

## 2016-10-13 ENCOUNTER — Ambulatory Visit (HOSPITAL_COMMUNITY)
Admission: EM | Admit: 2016-10-13 | Discharge: 2016-10-13 | Disposition: A | Discharge status: Home / Self Care | Payer: Medicare Other | Attending: Family Medicine | Admitting: Family Medicine

## 2016-10-13 ENCOUNTER — Encounter (HOSPITAL_COMMUNITY): Payer: Self-pay | Admitting: *Deleted

## 2016-10-13 DIAGNOSIS — B372 Candidiasis of skin and nail: Secondary | ICD-10-CM

## 2016-10-13 DIAGNOSIS — B3731 Acute candidiasis of vulva and vagina: Secondary | ICD-10-CM

## 2016-10-13 DIAGNOSIS — B373 Candidiasis of vulva and vagina: Secondary | ICD-10-CM | POA: Diagnosis not present

## 2016-10-13 DIAGNOSIS — R300 Dysuria: Secondary | ICD-10-CM | POA: Diagnosis not present

## 2016-10-13 LAB — POCT URINALYSIS DIP (DEVICE)
BILIRUBIN URINE: NEGATIVE
Glucose, UA: NEGATIVE mg/dL
KETONES UR: NEGATIVE mg/dL
LEUKOCYTES UA: NEGATIVE
NITRITE: NEGATIVE
Protein, ur: NEGATIVE mg/dL
Specific Gravity, Urine: 1.02 (ref 1.005–1.030)
Urobilinogen, UA: 1 mg/dL (ref 0.0–1.0)
pH: 7 (ref 5.0–8.0)

## 2016-10-13 MED ORDER — FLUCONAZOLE 150 MG PO TABS: ORAL_TABLET | ORAL | 0 refills | Status: DC

## 2016-10-13 MED ORDER — CLOTRIMAZOLE-BETAMETHASONE 1-0.05 % EX CREA: TOPICAL_CREAM | CUTANEOUS | 0 refills | Status: DC

## 2016-10-13 NOTE — ED Provider Notes (Signed)
[place]    CSN: [id number] Arrival date & time: 10/13/16  1205     History   Chief Complaint Chief Complaint  Patient presents with  . Vaginal Itching  . Dysuria    HPI Molly Lambert is a 82 y.o. female.   82 year old female presents the urgent care complaining of vaginal itching for one week. Denies vaginal discharge. She states the itching and irritation extends to her "butt". She initially presented with a complaint of UTI however her symptoms do not match that diagnosis.      Past Medical History:  Diagnosis Date  . Arthritis   . Breast cancer ([place]) 11/2010   Rt breast  . Cataract   . Erythema    reddened area of epidermis spnning concentrated around sinuses on the face; pt reports cracking, itchiness, and flaking  of affected skin; seen derm no absolute dx, will get 2nd opinion   . Hypercholesteremia   . Hypertension    NO CARDIAC MD,  [facility]  PCP  ,  URGENT MED  . Hypothyroidism   . Leg laceration    WITH SURGERY  . Paget's carcinoma of the nipple, right (HCC)    nipple removed  . S/P radiation therapy 03/14/11 - 04/05/11   Right Breast / 4256 cGy in 16 Fractions  . Thyroid disease     Patient Active Problem List   Diagnosis Date Noted  . OA (osteoarthritis) of hip 07/17/2016  . Breast microcalcifications 12/27/2011  . History of breast cancer 12/27/2011  . Hypothyroidism 08/25/2011  . Hypercholesteremia 08/25/2011  . HTN (hypertension) 08/25/2011  . Malignant neoplasm of central portion of right breast in female, estrogen receptor positive ([place]) 02/27/2011  . Breast mass, right 11/28/2010    Past Surgical History:  Procedure Laterality Date  . BREAST LUMPECTOMY  01/22/11   RIGHT BREAST LUMPECTOMY WITH BIOSPY OF 1 SENTINEL NODE, INVASIVE GRADE II DUCTAL CARCINOMA , INTERMEDIATE GRADE DUCTAL CARCINOMA IN SITU , DERMAL SKIN INVOLVED, MARGINS NEGATIVE,( 0/1)  NODE POSITIVE, ER+, PR+, LOW s-PHASE, HER 2 NEU- NO AMPLIFICATION  . BREAST  SURGERY  11/28/10   SKIN-PUNCH BIOSPY RIGHT BREAST(NIPPLE), ER+, RP+, LOW S-PHASE, HER 2NEU NEGAITIVE  . EYE SURGERY      BILATERAL CATARACT SURGERY  . JOINT REPLACEMENT     left hip  . PARTIAL MASTECTOMY WITH NEEDLE LOCALIZATION  01/10/2012   Procedure: PARTIAL MASTECTOMY WITH NEEDLE LOCALIZATION;  Surgeon: Marcello Moores A. Cornett, MD;  Location: [place];  Service: General;  Laterality: Right;  right breast needle localized partial mastectomy and removal of nipple   . TOTAL HIP ARTHROPLASTY Left 07/17/2016   Procedure: LEFT TOTAL HIP ARTHROPLASTY ANTERIOR APPROACH;  Surgeon: Gaynelle Arabian, MD;  [facility];  Service: Orthopedics;  Laterality: Left;  . TUBAL LIGATION  1972    OB History    No data available       Home Medications    Prior to Admission medications   Medication Sig Start Date End Date Taking? Authorizing Provider  amLODipine (NORVASC) 5 MG tablet take 1 tablet by mouth once daily 09/09/16  Yes Wendie Agreste, MD  chlorthalidone (HYGROTON) 25 MG tablet Take 25 mg by mouth daily.   Yes [provider]  levothyroxine (SYNTHROID, LEVOTHROID) 50 MCG tablet Take 1 tablet (50 mcg total) by mouth daily before breakfast. 02/29/16  Yes Wendie Agreste, MD  simvastatin (ZOCOR) 20 MG tablet Take 1 tablet (20 mg total) by mouth daily. 02/29/16  Yes  Wendie Agreste, MD  acetaminophen (TYLENOL) 500 MG tablet Take 1,500 mg by mouth 2 (two) times daily. Takes 3 tablets at a time    [provider]  clotrimazole-betamethasone (LOTRISONE) cream Apply to affected area 2 times daily until rash is gone. Do not put this cream in the vagina. 10/13/16   Janne Napoleon, NP  fluconazole (DIFLUCAN) 150 MG tablet 1 tab po q 2 days x 3 doses 10/13/16   Janne Napoleon, NP  ipratropium (ATROVENT) 0.03 % nasal spray Place 2 sprays into both nostrils 2 (two) times daily. As needed 02/29/16   Wendie Agreste, MD  methocarbamol (ROBAXIN) 500 MG tablet Take 1 tablet (500 mg  total) by mouth every 6 (six) hours as needed for muscle spasms. 07/18/16   Aluisio, Pilar Plate, MD  metroNIDAZOLE (METROGEL) 0.75 % gel Apply 1 application topically 2 (two) times daily. 06/13/16   Wendie Agreste, MD  oxyCODONE (OXY IR/ROXICODONE) 5 MG immediate release tablet Take 1-2 tablets (5-10 mg total) by mouth every 3 (three) hours as needed for breakthrough pain. 07/18/16   Gaynelle Arabian, MD  rivaroxaban (XARELTO) 10 MG TABS tablet Take 1 tablet (10 mg total) by mouth daily with breakfast. 07/18/16   Gaynelle Arabian, MD  traMADol (ULTRAM) 50 MG tablet Take 1-2 tablets (50-100 mg total) by mouth every 6 (six) hours as needed for moderate pain. 07/18/16   Gaynelle Arabian, MD    Family History Family History  Problem Relation Age of Onset  . Kidney disease Mother        kidney failure   . Kidney failure Mother   . Stroke Mother   . Heart disease Father        heart attack   . Heart attack Father   . Lymphoma Sister   . Pancreatitis Sister     Social History Social History  Substance Use Topics  . Smoking status: Never Smoker  . Smokeless tobacco: Never Used  . Alcohol use No     Comment: Rarely/On Special Occasions     Allergies   Patient has no known allergies.   Review of Systems Review of Systems  Constitutional: Negative.   Respiratory: Negative.   Gastrointestinal: Negative.   Genitourinary: Positive for dysuria. Negative for frequency, hematuria, pelvic pain, urgency, vaginal bleeding and vaginal discharge.  Skin: Negative.   All other systems reviewed and are negative.    Physical Exam Triage Vital Signs ED Triage Vitals  Enc Vitals Group     BP 10/13/16 1240 (!) 170/76     Pulse Rate 10/13/16 1240 74     Resp 10/13/16 1240 16     Temp 10/13/16 1240 98.5 F (36.9 C)     Temp Source 10/13/16 1240 Oral     SpO2 10/13/16 1240 100 %     Weight --      Height --      Head Circumference --      Peak Flow --      Pain Score 10/13/16 1241 2     Pain Loc --       Pain Edu? --      Excl. in [place]? --    No data found.   Updated Vital Signs BP (!) 170/76   Pulse 74   Temp 98.5 F (36.9 C) (Oral)   Resp 16   SpO2 100%   Visual Acuity Right Eye Distance:   Left Eye Distance:   Bilateral Distance:    Right Eye Near:  Left Eye Near:    Bilateral Near:     Physical Exam  Constitutional: She is oriented to person, place, and time. She appears well-developed and well-nourished. No distress.  Eyes: EOM are normal.  Neck: Neck supple.  Cardiovascular: Normal rate.   Pulmonary/Chest: Effort normal.  Genitourinary:  Genitourinary Comments: Algis Liming, RN present Inspection reveals intertrigo fungal type infection to the creases around the vulva and buttocks. There is also some redness within the introitus. No vaginal discharge.  Musculoskeletal:  Slowness of ambulation due to recent hip surgery.  Neurological: She is alert and oriented to person, place, and time.  Skin: Skin is warm and dry.  Psychiatric: She has a normal mood and affect.  Nursing note and vitals reviewed.    UC Treatments / Results  Labs (all labs ordered are listed, but only abnormal results are displayed) Labs Reviewed  POCT URINALYSIS DIP (DEVICE) - Abnormal; Notable for the following:       Result Value   Hgb urine dipstick TRACE (*)    All other components within normal limits    EKG  EKG Interpretation None       Radiology No results found.  Procedures Procedures (including critical care time)  Medications Ordered in UC Medications - No data to display   Initial Impression / Assessment and Plan / UC Course  I have reviewed the triage vital signs and the nursing notes.  Pertinent labs & imaging results that were available during my care of the patient were reviewed by me and considered in my medical decision making (see chart for details).     Keep the areas that are affected by fungus dry. Use a hair dryer after bathing or showering.  Apply the cream to the areas of redness and itching as directed but do not place in the vagina. Take the Diflucan tablets as directed this should help with with the fungus in all of the affected areas. In addition, only take the simvastatin every other day for this week.   Final Clinical Impressions(s) / UC Diagnoses   Final diagnoses:  Candidal intertrigo  Vulvovaginal candidiasis    New Prescriptions New Prescriptions   CLOTRIMAZOLE-BETAMETHASONE (LOTRISONE) CREAM    Apply to affected area 2 times daily until rash is gone. Do not put this cream in the vagina.   FLUCONAZOLE (DIFLUCAN) 150 MG TABLET    1 tab po q 2 days x 3 doses     Controlled Substance Prescriptions [place] Controlled Substance Registry consulted? Not Applicable   Janne Napoleon, NP 10/13/16 1326

## 2016-10-13 NOTE — ED Triage Notes (Signed)
C/O dysuria, urinary frequency, genital itching x approx 1 wk.  Denies fevers.

## 2016-10-13 NOTE — Discharge Instructions (Signed)
Keep the areas that are affected by fungus dry. Use a hair dryer after bathing or showering. Apply the cream to the areas of redness and itching as directed but do not place in the vagina. Take the Diflucan tablets as directed this should help with with the fungus in all of the affected areas. In addition, only take the simvastatin every other day for this week.

## 2016-10-13 NOTE — ED Notes (Signed)
At bedside for Molly Napoleon, np exam of patient.

## 2016-10-24 ENCOUNTER — Encounter: Payer: Self-pay | Admitting: Family Medicine

## 2016-10-24 ENCOUNTER — Ambulatory Visit (INDEPENDENT_AMBULATORY_CARE_PROVIDER_SITE_OTHER): Payer: Medicare Other | Admitting: Family Medicine

## 2016-10-24 VITALS — BP 132/63 | HR 71 | Temp 98.0°F | Resp 16 | Ht 64.0 in | Wt 149.0 lb

## 2016-10-24 DIAGNOSIS — I10 Essential (primary) hypertension: Secondary | ICD-10-CM

## 2016-10-24 DIAGNOSIS — E7800 Pure hypercholesterolemia, unspecified: Secondary | ICD-10-CM

## 2016-10-24 DIAGNOSIS — E039 Hypothyroidism, unspecified: Secondary | ICD-10-CM | POA: Diagnosis not present

## 2016-10-24 MED ORDER — CHLORTHALIDONE 25 MG PO TABS: 25.0000 mg | ORAL_TABLET | Freq: Every day | ORAL | 1 refills | Status: DC

## 2016-10-24 MED ORDER — LEVOTHYROXINE SODIUM 50 MCG PO TABS: 50.0000 ug | ORAL_TABLET | Freq: Every day | ORAL | 3 refills | Status: DC

## 2016-10-24 MED ORDER — AMLODIPINE BESYLATE 5 MG PO TABS: 5.0000 mg | ORAL_TABLET | Freq: Every day | ORAL | 1 refills | Status: DC

## 2016-10-24 MED ORDER — SIMVASTATIN 20 MG PO TABS: 20.0000 mg | ORAL_TABLET | Freq: Every day | ORAL | 1 refills | Status: DC

## 2016-10-24 NOTE — Patient Instructions (Addendum)
  No change in medications for now. If you do experience lightheadedness more frequently, please return for separate office visit as that can come from many different causes. Return to the clinic or go to the nearest emergency room if any of your symptoms worsen or new symptoms occur.  As long as labs look okay, I will see you in 6 months. Good luck on your upcoming surgery!   IF you received an x-ray today, you will receive an invoice from [facility] Radiology. Please contact [facility] Radiology at [phone] with questions or concerns regarding your invoice.   IF you received labwork today, you will receive an invoice from [place]. Please contact [facility] at [phone] with questions or concerns regarding your invoice.   Our billing staff will not be able to assist you with questions regarding bills from these companies.  You will be contacted with the lab results as soon as they are available. The fastest way to get your results is to activate your My Chart account. Instructions are located on the last page of this paperwork. If you have not heard from [place] regarding the results in 2 weeks, please contact this office.

## 2016-10-24 NOTE — Progress Notes (Signed)
Subjective:  This chart was scribed for Wendie Agreste, MD by Tamsen Roers, at [place] at [facility]. This patient was seen in room 25 and the patient's care was started at 11:08 AM.   Chief Complaint  Patient presents with  . Follow-up    HTN  . Medication Refill    synthroid     Patient ID: Molly Lambert, female    DOB: 04/30/1934, 82 y.o.   MRN: [id number]  HPI HPI Comments: Molly Lambert is a 82 y.o. female who presents to Primary Care at [facility] for a follow up regarding hypertension and hypothyroidism.   Patient had left hip surgery in May.  She states that it went "wonderfully" and she is eager to have her right hip done in September.  She has lost 10 lbs since her last visit.    Hypertension: She is on Amlodipine 5 mg QD and Chlorthalidone 25 mg QD.---- She is compliant with this. Patient does not check her blood pressures at home.  She had previous bradycardia so Atenolol was discontinued.    Hyporthyroidism: Takes synthroid 50 mcg--- Patient needs a refill for this.  She denies any side effects from the medication.  Lab Results  Component Value Date   TSH 2.700 02/29/2016    Hyperlipidemia: Takes simvastatin  20 mg QD. She had missed some of her simvastatin when checked in January, planned for daily dosing. ---- Patient is compliant with this medication and has not missed any doses.  Lab Results  Component Value Date   CHOL 295 (H) 02/29/2016   HDL 60 02/29/2016   LDLCALC 198 (H) 02/29/2016   LDLDIRECT 131 (H) 08/22/2011   TRIG 187 (H) 02/29/2016   CHOLHDL 4.9 (H) 02/29/2016   Lab Results  Component Value Date   ALT 16 07/10/2016   AST 18 07/10/2016   ALKPHOS 86 07/10/2016   BILITOT 0.8 07/10/2016       Patient Active Problem List   Diagnosis Date Noted  . OA (osteoarthritis) of hip 07/17/2016  . Breast microcalcifications 12/27/2011  . History of breast cancer 12/27/2011  . Hypothyroidism 08/25/2011  . Hypercholesteremia 08/25/2011  .  HTN (hypertension) 08/25/2011  . Malignant neoplasm of central portion of right breast in female, estrogen receptor positive ([place]) 02/27/2011  . Breast mass, right 11/28/2010   Past Medical History:  Diagnosis Date  . Arthritis   . Breast cancer ([place]) 11/2010   Rt breast  . Cataract   . Erythema    reddened area of epidermis spnning concentrated around sinuses on the face; pt reports cracking, itchiness, and flaking  of affected skin; seen derm no absolute dx, will get 2nd opinion   . Hypercholesteremia   . Hypertension    NO CARDIAC MD,  [facility]  PCP  ,  URGENT MED  . Hypothyroidism   . Leg laceration    WITH SURGERY  . Paget's carcinoma of the nipple, right (HCC)    nipple removed  . S/P radiation therapy 03/14/11 - 04/05/11   Right Breast / 4256 cGy in 16 Fractions  . Thyroid disease    Past Surgical History:  Procedure Laterality Date  . BREAST LUMPECTOMY  01/22/11   RIGHT BREAST LUMPECTOMY WITH BIOSPY OF 1 SENTINEL NODE, INVASIVE GRADE II DUCTAL CARCINOMA , INTERMEDIATE GRADE DUCTAL CARCINOMA IN SITU , DERMAL SKIN INVOLVED, MARGINS NEGATIVE,( 0/1)  NODE POSITIVE, ER+, PR+, LOW s-PHASE, HER 2 NEU- NO AMPLIFICATION  . BREAST SURGERY  11/28/10   SKIN-PUNCH BIOSPY  RIGHT BREAST(NIPPLE), ER+, RP+, LOW S-PHASE, HER 2NEU NEGAITIVE  . EYE SURGERY      BILATERAL CATARACT SURGERY  . JOINT REPLACEMENT     left hip  . PARTIAL MASTECTOMY WITH NEEDLE LOCALIZATION  01/10/2012   Procedure: PARTIAL MASTECTOMY WITH NEEDLE LOCALIZATION;  Surgeon: Marcello Moores A. Cornett, MD;  Location: [place];  Service: General;  Laterality: Right;  right breast needle localized partial mastectomy and removal of nipple   . TOTAL HIP ARTHROPLASTY Left 07/17/2016   Procedure: LEFT TOTAL HIP ARTHROPLASTY ANTERIOR APPROACH;  Surgeon: Gaynelle Arabian, MD;  [facility];  Service: Orthopedics;  Laterality: Left;  . TUBAL LIGATION  1972   No Known Allergies Prior to Admission medications     Medication Sig Start Date End Date Taking? Authorizing Provider  acetaminophen (TYLENOL) 500 MG tablet Take 1,500 mg by mouth 2 (two) times daily. Takes 3 tablets at a time    [provider]  amLODipine (NORVASC) 5 MG tablet take 1 tablet by mouth once daily 09/09/16   Wendie Agreste, MD  chlorthalidone (HYGROTON) 25 MG tablet Take 25 mg by mouth daily.    [provider]  clotrimazole-betamethasone (LOTRISONE) cream Apply to affected area 2 times daily until rash is gone. Do not put this cream in the vagina. 10/13/16   Janne Napoleon, NP  fluconazole (DIFLUCAN) 150 MG tablet 1 tab po q 2 days x 3 doses 10/13/16   Janne Napoleon, NP  ipratropium (ATROVENT) 0.03 % nasal spray Place 2 sprays into both nostrils 2 (two) times daily. As needed 02/29/16   Wendie Agreste, MD  levothyroxine (SYNTHROID, LEVOTHROID) 50 MCG tablet Take 1 tablet (50 mcg total) by mouth daily before breakfast. 02/29/16   Wendie Agreste, MD  methocarbamol (ROBAXIN) 500 MG tablet Take 1 tablet (500 mg total) by mouth every 6 (six) hours as needed for muscle spasms. 07/18/16   Aluisio, Pilar Plate, MD  metroNIDAZOLE (METROGEL) 0.75 % gel Apply 1 application topically 2 (two) times daily. 06/13/16   Wendie Agreste, MD  oxyCODONE (OXY IR/ROXICODONE) 5 MG immediate release tablet Take 1-2 tablets (5-10 mg total) by mouth every 3 (three) hours as needed for breakthrough pain. 07/18/16   Gaynelle Arabian, MD  rivaroxaban (XARELTO) 10 MG TABS tablet Take 1 tablet (10 mg total) by mouth daily with breakfast. 07/18/16   Gaynelle Arabian, MD  simvastatin (ZOCOR) 20 MG tablet Take 1 tablet (20 mg total) by mouth daily. 02/29/16   Wendie Agreste, MD  traMADol (ULTRAM) 50 MG tablet Take 1-2 tablets (50-100 mg total) by mouth every 6 (six) hours as needed for moderate pain. 07/18/16   Gaynelle Arabian, MD   Social History   Social History  . Marital status: Married    Spouse name: N/A  . Number of children: 3  . Years of education: 12    Occupational History  . Waitress     [employer]   Social History Main Topics  . Smoking status: Never Smoker  . Smokeless tobacco: Never Used  . Alcohol use No     Comment: Rarely/On Special Occasions  . Drug use: No  . Sexual activity: Not on file     Comment: G3, P3, MENARCHE, AGE 57, MENOPAUSE AGE 18, NO BC AND HRT X 10 YEARS   Other Topics Concern  . Not on file   Social History Narrative   Married for 56 years      3 children, one deceased  6 grand-children, 3 great-grandchildren    Review of Systems  Constitutional: Negative for fatigue and unexpected weight change.  Respiratory: Negative for chest tightness and shortness of breath.   Cardiovascular: Negative for chest pain, palpitations and leg swelling.  Gastrointestinal: Negative for abdominal pain and blood in stool.  Neurological: Negative for dizziness, syncope, light-headedness and headaches.       Objective:   Physical Exam  Constitutional: She is oriented to person, place, and time. She appears well-developed and well-nourished.  HENT:  Head: Normocephalic and atraumatic.  Eyes: Pupils are equal, round, and reactive to light. Conjunctivae and EOM are normal.  Neck: Carotid bruit is not present.  No apparent thyromegaly or nodules.   Cardiovascular: Normal rate, regular rhythm, normal heart sounds and intact distal pulses.   Pulmonary/Chest: Effort normal and breath sounds normal.  Abdominal: Soft. She exhibits no pulsatile midline mass. There is no tenderness.  Neurological: She is alert and oriented to person, place, and time.  Skin: Skin is warm and dry.  Psychiatric: She has a normal mood and affect. Her behavior is normal.  Vitals reviewed.   Vitals:   10/24/16 1030  BP: 132/63  Pulse: 71  Resp: 16  Temp: 98 F (36.7 C)  TempSrc: Oral  SpO2: 95%  Weight: 149 lb (67.6 kg)  Height: 5\' 4"  (1.626 m)        Assessment & Plan:   Molly Lambert is a 82 y.o.  female Hypothyroidism, unspecified type - Plan: TSH, levothyroxine (SYNTHROID, LEVOTHROID) 50 MCG tablet  -Tolerating current dose of Synthroid, check levels.  Essential hypertension - Plan: amLODipine (NORVASC) 5 MG tablet, chlorthalidone (HYGROTON) 25 MG tablet  -Rare lightheadedness, BP in office overall looks okay. Continue same dose of chlorthalidone and amlodipine, does feel better now that she is off atenolol and heart rate is more normal. RTC precautions if worsening lightheadedness. Continue same dose of medications for now. Labs pending  Hypercholesteremia - Plan: simvastatin (ZOCOR) 20 MG tablet, Comprehensive metabolic panel, Lipid panel  -Check CMP, lipid panel, continue Zocor same dose for now.  Meds ordered this encounter  Medications  . amLODipine (NORVASC) 5 MG tablet    Sig: Take 1 tablet (5 mg total) by mouth daily.    Dispense:  90 tablet    Refill:  1  . chlorthalidone (HYGROTON) 25 MG tablet    Sig: Take 1 tablet (25 mg total) by mouth daily.    Dispense:  90 tablet    Refill:  1  . simvastatin (ZOCOR) 20 MG tablet    Sig: Take 1 tablet (20 mg total) by mouth daily.    Dispense:  90 tablet    Refill:  1  . levothyroxine (SYNTHROID, LEVOTHROID) 50 MCG tablet    Sig: Take 1 tablet (50 mcg total) by mouth daily before breakfast.    Dispense:  90 tablet    Refill:  3   Patient Instructions    No change in medications for now. If you do experience lightheadedness more frequently, please return for separate office visit as that can come from many different causes. Return to the clinic or go to the nearest emergency room if any of your symptoms worsen or new symptoms occur.  As long as labs look okay, I will see you in 6 months. Good luck on your upcoming surgery!   IF you received an x-ray today, you will receive an invoice from [facility] Radiology. Please contact [facility] Radiology at [phone] with questions or concerns  regarding your invoice.   IF you  received labwork today, you will receive an invoice from [place]. Please contact [facility] at [phone] with questions or concerns regarding your invoice.   Our billing staff will not be able to assist you with questions regarding bills from these companies.  You will be contacted with the lab results as soon as they are available. The fastest way to get your results is to activate your My Chart account. Instructions are located on the last page of this paperwork. If you have not heard from [place] regarding the results in 2 weeks, please contact this office.       I personally performed the services described in this documentation, which was scribed in my presence. The recorded information has been reviewed and considered for accuracy and completeness, addended by me as needed, and agree with information above.  Signed,   [name], MD Primary Care at [facility].  10/26/16 2:53 PM

## 2016-10-25 LAB — LIPID PANEL
CHOLESTEROL TOTAL: 231 mg/dL — AB (ref 100–199)
Chol/HDL Ratio: 3.7 ratio (ref 0.0–4.4)
HDL: 62 mg/dL (ref >39)
LDL Calculated: 135 mg/dL — ABNORMAL HIGH (ref 0–99)
TRIGLYCERIDES: 171 mg/dL — AB (ref 0–149)
VLDL CHOLESTEROL CAL: 34 mg/dL (ref 5–40)

## 2016-10-25 LAB — COMPREHENSIVE METABOLIC PANEL
ALK PHOS: 127 IU/L — AB (ref 39–117)
ALT: 10 IU/L (ref 0–32)
AST: 18 IU/L (ref 0–40)
Albumin/Globulin Ratio: 1.5 (ref 1.2–2.2)
Albumin: 4.3 g/dL (ref 3.5–4.7)
BUN/Creatinine Ratio: 27 (ref 12–28)
BUN: 22 mg/dL (ref 8–27)
Bilirubin Total: 0.6 mg/dL (ref 0.0–1.2)
CO2: 26 mmol/L (ref 20–29)
CREATININE: 0.83 mg/dL (ref 0.57–1.00)
Calcium: 10 mg/dL (ref 8.7–10.3)
Chloride: 96 mmol/L (ref 96–106)
GFR calc Af Amer: 77 mL/min/{1.73_m2} (ref >59)
GFR calc non Af Amer: 67 mL/min/{1.73_m2} (ref >59)
GLUCOSE: 113 mg/dL — AB (ref 65–99)
Globulin, Total: 2.9 g/dL (ref 1.5–4.5)
Potassium: 3.5 mmol/L (ref 3.5–5.2)
SODIUM: 138 mmol/L (ref 134–144)
Total Protein: 7.2 g/dL (ref 6.0–8.5)

## 2016-10-25 LAB — TSH: TSH: 0.617 u[IU]/mL (ref 0.450–4.500)

## 2016-10-29 ENCOUNTER — Ambulatory Visit: Payer: Self-pay | Admitting: Orthopedic Surgery

## 2016-10-29 NOTE — H&P (Signed)
Francina Ames Vanderhoof DOB: 07/06/1934 Married / Language: English / Race: White Female Date of admission: November 20, 2016  Chief complaint: right hip pain History of Present Illness The patient is a 82 year old female who comes in  for a preoperative History and Physical. The patient is scheduled for a right total hip arthroplasty (anterior) to be performed by Dr. Dione Plover. Aluisio, MD at [facility] on 11/20/2016. The patient is a 82 year old female presenting a couple of month outs from the left total hip arthroplasty. The patient states that she is doing very well at this time. The pain is under excellent control at this time and describes their pain as no pain. They are currently on Ultram for their pain. The patient is currently doing home exercise program. The patient feels that they are progressing well at this time. The patient continues to have issues w/ the right hip-catching, intense pain. Taking Tramadol and Robaxin, her left hip is doing great at this time but the right hip is increasingly problematic. She feels like she cannot fully recover because of the right hip pain. She has documented advanced arthritis of that right hip. She would like to get the right hip replaced now at this time. They have been treated conservatively in the past for the above stated problem and despite conservative measures, they continue to have progressive pain and severe functional limitations and dysfunction. They have failed non-operative management including home exercise and medications. It is felt that they would benefit from undergoing total joint replacement. Risks and benefits of the procedure have been discussed with the patient and they elect to proceed with surgery. There are no active contraindications to surgery such as ongoing infection or rapidly progressive neurological disease.   Problem List/Past Medical Primary osteoarthritis hip Breast Cancer  Right-sided; Radiation Therapy High  blood pressure  Hypercholesterolemia  Hypothyroidism  Varicose veins  Mumps  Measles  Positive MRSA Nasal Swab  2012 Hemorrhoids   Allergies No Known Drug Allergies   Family History  Cancer  Sister. Cerebrovascular Accident  Mother. Drug / Alcohol Addiction  child First Degree Relatives  reported Heart Disease  Father. Hypertension  Father, Mother. Kidney disease  Mother.  Social History  Children  2 Current work status  retired Furniture conservator/restorer weekly; does other Living situation  live with spouse Marital status  married Never consumed alcohol  05/07/2016: Never consumed alcohol No history of drug/alcohol rehab  Not under pain contract  Number of flights of stairs before winded  greater than 5 Tobacco use  Never smoker. 05/07/2016 Advance Directives  Living Will. Post-Surgical Plans  Home With Family.  Medication History Tylenol (500MG  Capsule, Oral) Active. Chlorthalidone (25MG  Tablet, Oral daily) Active. Levothyroxine Sodium (50MCG Tablet, Oral once a day before breakfast) Active. AmLODIPine Besylate (5MG  Tablet, Oral daily) Active. Simvastatin (20MG  Tablet, Oral QHS) Active. TraMADol HCl (50MG  Tablet, Oral as needed) Active. Ibuprofen (200MG  Tablet, Oral as needed) Active.  Past Surgical History Breast Mass; Local Excision  Date: 12/2010. right  Review of Systems General Not Present- Chills, Fatigue, Fever, Memory Loss, Night Sweats, Weight Gain and Weight Loss. Skin Present- Bruising. Not Present- Eczema, Hives, Itching, Lesions and Rash. HEENT Not Present- Dentures, Double Vision, Headache, Hearing Loss, Tinnitus and Visual Loss. Respiratory Not Present- Allergies, Chronic Cough, Coughing up blood, Shortness of breath at rest and Shortness of breath with exertion. Cardiovascular Present- Slow Heart Rate. Not Present- Chest Pain, Difficulty Breathing Lying Down, Murmur, Palpitations, Racing/skipping heartbeats  and  Swelling. Gastrointestinal Not Present- Abdominal Pain, Bloody Stool, Constipation, Diarrhea, Difficulty Swallowing, Heartburn, Jaundice, Loss of appetitie, Nausea and Vomiting. Female Genitourinary Not Present- Blood in Urine, Discharge, Flank Pain, Incontinence, Painful Urination, Urgency, Urinary frequency, Urinary Retention, Urinating at Night and Weak urinary stream. Musculoskeletal Present- Joint Stiffness. Not Present- Back Pain, Joint Pain, Joint Swelling, Morning Stiffness, Muscle Pain, Muscle Weakness and Spasms. Neurological Not Present- Blackout spells, Difficulty with balance, Dizziness, Paralysis, Tremor and Weakness. Psychiatric Not Present- Insomnia. Endocrine Present- Thyroid Problems. Hematology Present- Easy Bruising.  Vitals Weight: 154 lb Height: 64in Weight was reported by patient. Height was reported by patient. Body Surface Area: 1.75 m Body Mass Index: 26.43 kg/m  Pulse: 60 (Regular)  BP: 126/58 (Sitting, Left Arm, Standard) Occasional skipped beat   Physical Exam General Mental Status -Alert, cooperative and good historian. General Appearance-pleasant, Not in acute distress. Orientation-Oriented X3. Build & Nutrition-Well nourished and Well developed.  Head and Neck Head-normocephalic, atraumatic . Neck Global Assessment - supple, no bruit auscultated on the right, no bruit auscultated on the left.  Eye Vision-Wears corrective lenses. Pupil - Bilateral-Regular and Round. Motion - Bilateral-EOMI.  Chest and Lung Exam Auscultation Breath sounds - clear at anterior chest wall and clear at posterior chest wall. Adventitious sounds - No Adventitious sounds.  Cardiovascular Auscultation Rhythm - Regular rate and rhythm. Heart Sounds - S1 WNL and S2 WNL. Murmurs & Other Heart Sounds: Murmur 1 - Location - Aortic Area and Sternal Border - Left. Timing - Early systolic. Grade - II/VI. Character - Low  pitched.  Abdomen Palpation/Percussion Tenderness - Abdomen is non-tender to palpation. Rigidity (guarding) - Abdomen is soft. Auscultation Auscultation of the abdomen reveals - Bowel sounds normal.  Female Genitourinary Note: Not done, not pertinent to present illness   Musculoskeletal Note: Left hip can be flexed 110 rotated and 20 out 30 abducted 30 without discomfort. She has no pain on range of motion of the hip. Right hip can be flexed to 90 no internal rotation no external rotation 10 of abduction. There is pain on attempted range of motion of the right hip.  Radiographs AP pelvis AP and lateral left hip show the prosthesis on the left in excellent position with no periprosthetic abnormalities. On the right she has end-stage bone-on-bone arthritis with near ankylosis of the hip.  Assessment & Plan  S/P left hip joint replacement surgery (Z47.1, [id number])  Primary osteoarthritis of right hip (M16.11)  Note:Surgical Plans: Right Total Hip Replacement - Anterior Approach  Disposition: Home, HEP - Exercise Sheet Provided  PCP: Dr. [name]  Topical TXA  Anesthesia Issues: None  Patient was instructed on what medications to stop prior to surgery.  AVOID RIGHT ARM FOR IV'S AND BP'S  Signed electronically by Joelene Millin, III PA-C

## 2016-10-30 ENCOUNTER — Encounter: Payer: Self-pay | Admitting: Radiology

## 2016-10-31 ENCOUNTER — Ambulatory Visit: Payer: Self-pay | Admitting: Orthopedic Surgery

## 2016-11-07 NOTE — Patient Instructions (Signed)
Molly Lambert  11/07/2016   Your procedure is scheduled on: 11-20-16   Report to [facility] Main  Entrance Report to Admitting at 11:15 AM   Call this number if you have problems the morning of surgery  [phone]   Remember: ONLY 1 PERSON MAY GO WITH YOU TO SHORT STAY TO GET  READY MORNING OF YOUR SURGERY.  Do not eat food or drink liquids :After Midnight. You may have a Clear Liquid Diet from Midnight unitl 7:45 AM. After 7:45 AM, nothing until after surgery     CLEAR LIQUID DIET   Foods Allowed                                                                     Foods Excluded  Coffee and tea, regular and decaf                             liquids that you cannot  Plain Jell-O in any flavor                                             see through such as: Fruit ices (not with fruit pulp)                                     milk, soups, orange juice  Iced Popsicles                                    All solid food Carbonated beverages, regular and diet                                    Cranberry, grape and apple juices Sports drinks like Gatorade Lightly seasoned clear broth or consume(fat free) Sugar, honey syrup  Sample Menu Breakfast                                Lunch                                     Supper Cranberry juice                    Beef broth                            Chicken broth Jell-O                                     Grape juice  Apple juice Coffee or tea                        Jell-O                                      Popsicle                                                Coffee or tea                        Coffee or tea  _____________________________________________________________________     Take these medicines the morning of surgery with A SIP OF WATER: Amlodipine (Norvasc), Levothyroxine (Synthroid), and Simvastatin (Zocor)                                You may not have any metal on  your body including hair pins and              piercings  Do not wear jewelry, make-up, lotions, powders or perfumes, deodorant             Do not wear nail polish.  Do not shave  48 hours prior to surgery.                Do not bring valuables to the hospital. [place].  Contacts, dentures or bridgework may not be worn into surgery.  Leave suitcase in the car. After surgery it may be brought to your room.                 Please read over the following fact sheets you were given: _____________________________________________________________________             [facility] - Preparing for Surgery Before surgery, you can play an important role.  Because skin is not sterile, your skin needs to be as free of germs as possible.  You can reduce the number of germs on your skin by washing with CHG (chlorahexidine gluconate) soap before surgery.  CHG is an antiseptic cleaner which kills germs and bonds with the skin to continue killing germs even after washing. Please DO NOT use if you have an allergy to CHG or antibacterial soaps.  If your skin becomes reddened/irritated stop using the CHG and inform your nurse when you arrive at Short Stay. Do not shave (including legs and underarms) for at least 48 hours prior to the first CHG shower.  You may shave your face/neck. Please follow these instructions carefully:  1.  Shower with CHG Soap the night before surgery and the  morning of Surgery.  2.  If you choose to wash your hair, wash your hair first as usual with your  normal  shampoo.  3.  After you shampoo, rinse your hair and body thoroughly to remove the  shampoo.                           4.  Use CHG as you would any other liquid  soap.  You can apply chg directly  to the skin and wash                       Gently with a scrungie or clean washcloth.  5.  Apply the CHG Soap to your body ONLY FROM THE NECK DOWN.   Do not use on face/ open                            Wound or open sores. Avoid contact with eyes, ears mouth and genitals (private parts).                       Wash face,  Genitals (private parts) with your normal soap.             6.  Wash thoroughly, paying special attention to the area where your surgery  will be performed.  7.  Thoroughly rinse your body with warm water from the neck down.  8.  DO NOT shower/wash with your normal soap after using and rinsing off  the CHG Soap.                9.  Pat yourself dry with a clean towel.            10.  Wear clean pajamas.            11.  Place clean sheets on your bed the night of your first shower and do not  sleep with pets. Day of Surgery : Do not apply any lotions/deodorants the morning of surgery.  Please wear clean clothes to the hospital/surgery center.  FAILURE TO FOLLOW THESE INSTRUCTIONS MAY RESULT IN THE CANCELLATION OF YOUR SURGERY PATIENT SIGNATURE_________________________________  NURSE SIGNATURE__________________________________  ________________________________________________________________________   Adam Phenix  An incentive spirometer is a tool that can help keep your lungs clear and active. This tool measures how well you are filling your lungs with each breath. Taking long deep breaths may help reverse or decrease the chance of developing breathing (pulmonary) problems (especially infection) following:  A long period of time when you are unable to move or be active. BEFORE THE PROCEDURE   If the spirometer includes an indicator to show your best effort, your nurse or respiratory therapist will set it to a desired goal.  If possible, sit up straight or lean slightly forward. Try not to slouch.  Hold the incentive spirometer in an upright position. INSTRUCTIONS FOR USE  1. Sit on the edge of your bed if possible, or sit up as far as you can in bed or on a chair. 2. Hold the incentive spirometer in an upright position. 3. Breathe out  normally. 4. Place the mouthpiece in your mouth and seal your lips tightly around it. 5. Breathe in slowly and as deeply as possible, raising the piston or the ball toward the top of the column. 6. Hold your breath for 3-5 seconds or for as long as possible. Allow the piston or ball to fall to the bottom of the column. 7. Remove the mouthpiece from your mouth and breathe out normally. 8. Rest for a few seconds and repeat Steps 1 through 7 at least 10 times every 1-2 hours when you are awake. Take your time and take a few normal breaths between deep breaths. 9. The spirometer may include an indicator to show your best effort. Use the indicator as a goal  to work toward during each repetition. 10. After each set of 10 deep breaths, practice coughing to be sure your lungs are clear. If you have an incision (the cut made at the time of surgery), support your incision when coughing by placing a pillow or rolled up towels firmly against it. Once you are able to get out of bed, walk around indoors and cough well. You may stop using the incentive spirometer when instructed by your caregiver.  RISKS AND COMPLICATIONS  Take your time so you do not get dizzy or light-headed.  If you are in pain, you may need to take or ask for pain medication before doing incentive spirometry. It is harder to take a deep breath if you are having pain. AFTER USE  Rest and breathe slowly and easily.  It can be helpful to keep track of a log of your progress. Your caregiver can provide you with a simple table to help with this. If you are using the spirometer at home, follow these instructions: [place] IF:   You are having difficultly using the spirometer.  You have trouble using the spirometer as often as instructed.  Your pain medication is not giving enough relief while using the spirometer.  You develop fever of 100.5 F (38.1 C) or higher. SEEK IMMEDIATE MEDICAL CARE IF:   You cough up bloody sputum  that had not been present before.  You develop fever of 102 F (38.9 C) or greater.  You develop worsening pain at or near the incision site. MAKE SURE YOU:   Understand these instructions.  Will watch your condition.  Will get help right away if you are not doing well or get worse. Document Released: 06/24/2006 Document Revised: 05/06/2011 Document Reviewed: 08/25/2006 Exit[employer] Patient Information 2014 [employer], [place].   ________________________________________________________________________  WHAT IS A BLOOD TRANSFUSION? Blood Transfusion Information  A transfusion is the replacement of blood or some of its parts. Blood is made up of multiple cells which provide different functions.  Red blood cells carry oxygen and are used for blood loss replacement.  White blood cells fight against infection.  Platelets control bleeding.  Plasma helps clot blood.  Other blood products are available for specialized needs, such as hemophilia or other clotting disorders. BEFORE THE TRANSFUSION  Who gives blood for transfusions?   Healthy volunteers who are fully evaluated to make sure their blood is safe. This is blood bank blood. Transfusion therapy is the safest it has ever been in the practice of medicine. Before blood is taken from a donor, a complete history is taken to make sure that person has no history of diseases nor engages in risky social behavior (examples are intravenous drug use or sexual activity with multiple partners). The donor's travel history is screened to minimize risk of transmitting infections, such as malaria. The donated blood is tested for signs of infectious diseases, such as HIV and hepatitis. The blood is then tested to be sure it is compatible with you in order to minimize the chance of a transfusion reaction. If you or a relative donates blood, this is often done in anticipation of surgery and is not appropriate for emergency situations. It takes many days to  process the donated blood. RISKS AND COMPLICATIONS Although transfusion therapy is very safe and saves many lives, the main dangers of transfusion include:   Getting an infectious disease.  Developing a transfusion reaction. This is an allergic reaction to something in the blood you were given. Every precaution  is taken to prevent this. The decision to have a blood transfusion has been considered carefully by your caregiver before blood is given. Blood is not given unless the benefits outweigh the risks. AFTER THE TRANSFUSION  Right after receiving a blood transfusion, you will usually feel much better and more energetic. This is especially true if your red blood cells have gotten low (anemic). The transfusion raises the level of the red blood cells which carry oxygen, and this usually causes an energy increase.  The nurse administering the transfusion will monitor you carefully for complications. HOME CARE INSTRUCTIONS  No special instructions are needed after a transfusion. You may find your energy is better. Speak with your caregiver about any limitations on activity for underlying diseases you may have. SEEK MEDICAL CARE IF:   Your condition is not improving after your transfusion.  You develop redness or irritation at the intravenous (IV) site. SEEK IMMEDIATE MEDICAL CARE IF:  Any of the following symptoms occur over the next 12 hours:  Shaking chills.  You have a temperature by mouth above 102 F (38.9 C), not controlled by medicine.  Chest, back, or muscle pain.  People around you feel you are not acting correctly or are confused.  Shortness of breath or difficulty breathing.  Dizziness and fainting.  You get a rash or develop hives.  You have a decrease in urine output.  Your urine turns a dark color or changes to pink, red, or brown. Any of the following symptoms occur over the next 10 days:  You have a temperature by mouth above 102 F (38.9 C), not controlled by  medicine.  Shortness of breath.  Weakness after normal activity.  The white part of the eye turns yellow (jaundice).  You have a decrease in the amount of urine or are urinating less often.  Your urine turns a dark color or changes to pink, red, or brown. Document Released: 02/09/2000 Document Revised: 05/06/2011 Document Reviewed: 09/28/2007 [facility] Patient Information 2014 [place], [place].  _______________________________________________________________________

## 2016-11-07 NOTE — Progress Notes (Signed)
07-03-16 (EPIC) ECHO  07-17-16 (EPIC) EKG  04-27-16 (EPIC) CXR  10-24-16 (EPIC) CMP

## 2016-11-11 ENCOUNTER — Encounter (HOSPITAL_COMMUNITY)
Admission: RE | Admit: 2016-11-11 | Discharge: 2016-11-11 | Disposition: A | Discharge status: Home / Self Care | Payer: Medicare Other | Source: Ambulatory Visit | Attending: Orthopedic Surgery | Admitting: Orthopedic Surgery

## 2016-11-11 ENCOUNTER — Encounter (HOSPITAL_COMMUNITY): Payer: Self-pay

## 2016-11-11 DIAGNOSIS — M1611 Unilateral primary osteoarthritis, right hip: Secondary | ICD-10-CM | POA: Insufficient documentation

## 2016-11-11 DIAGNOSIS — Z01818 Encounter for other preprocedural examination: Secondary | ICD-10-CM | POA: Diagnosis present

## 2016-11-11 LAB — APTT: APTT: 27 s (ref 24–36)

## 2016-11-11 LAB — CBC
HEMATOCRIT: 35.1 % — AB (ref 36.0–46.0)
HEMOGLOBIN: 11.8 g/dL — AB (ref 12.0–15.0)
MCH: 29.7 pg (ref 26.0–34.0)
MCHC: 33.6 g/dL (ref 30.0–36.0)
MCV: 88.4 fL (ref 78.0–100.0)
Platelets: 320 10*3/uL (ref 150–400)
RBC: 3.97 MIL/uL (ref 3.87–5.11)
RDW: 13.5 % (ref 11.5–15.5)
WBC: 6.4 10*3/uL (ref 4.0–10.5)

## 2016-11-11 LAB — SURGICAL PCR SCREEN
MRSA, PCR: NEGATIVE
Staphylococcus aureus: NEGATIVE

## 2016-11-11 LAB — PROTIME-INR
INR: 0.96
PROTHROMBIN TIME: 12.7 s (ref 11.4–15.2)

## 2016-11-19 ENCOUNTER — Ambulatory Visit: Payer: Self-pay | Admitting: Orthopedic Surgery

## 2016-11-19 NOTE — H&P (Signed)
Molly Lambert DOB: 08-19-1934 Married / Language: English / Race: White Female Date of admission: November 20, 2016  Chief complaint: right hip pain History of Present Illness The patient is a 82 year old female who comes in  for a preoperative History and Physical. The patient is scheduled for a right total hip arthroplasty (anterior) to be performed by Dr. Dione Plover. Aluisio, MD at [facility] on 11/20/2016. The patient is a 82 year old female presenting a couple of month outs from the left total hip arthroplasty. The patient states that she is doing very well at this time. The pain is under excellent control at this time and describes their pain as no pain. They are currently on Ultram for their pain. The patient is currently doing home exercise program. The patient feels that they are progressing well at this time. The patient continues to have issues w/ the right hip-catching, intense pain. Taking Tramadol and Robaxin, her left hip is doing great at this time but the right hip is increasingly problematic. She feels like she cannot fully recover because of the right hip pain. She has documented advanced arthritis of that right hip. She would like to get the right hip replaced now at this time. They have been treated conservatively in the past for the above stated problem and despite conservative measures, they continue to have progressive pain and severe functional limitations and dysfunction. They have failed non-operative management including home exercise and medications. It is felt that they would benefit from undergoing total joint replacement. Risks and benefits of the procedure have been discussed with the patient and they elect to proceed with surgery. There are no active contraindications to surgery such as ongoing infection or rapidly progressive neurological disease.   Problem List/Past Medical Primary osteoarthritis hip Breast Cancer  Right-sided; Radiation Therapy High  blood pressure  Hypercholesterolemia  Hypothyroidism  Varicose veins  Mumps  Measles  Positive MRSA Nasal Swab  2012 Hemorrhoids   Allergies No Known Drug Allergies   Family History  Cancer  Sister. Cerebrovascular Accident  Mother. Drug / Alcohol Addiction  child First Degree Relatives  reported Heart Disease  Father. Hypertension  Father, Mother. Kidney disease  Mother.  Social History  Children  2 Current work status  retired Furniture conservator/restorer weekly; does other Living situation  live with spouse Marital status  married Never consumed alcohol  05/07/2016: Never consumed alcohol No history of drug/alcohol rehab  Not under pain contract  Number of flights of stairs before winded  greater than 5 Tobacco use  Never smoker. 05/07/2016 Advance Directives  Living Will. Post-Surgical Plans  Home With Family.  Medication History Tylenol (500MG  Capsule, Oral) Active. Chlorthalidone (25MG  Tablet, Oral daily) Active. Levothyroxine Sodium (50MCG Tablet, Oral once a day before breakfast) Active. AmLODIPine Besylate (5MG  Tablet, Oral daily) Active. Simvastatin (20MG  Tablet, Oral QHS) Active. TraMADol HCl (50MG  Tablet, Oral as needed) Active. Ibuprofen (200MG  Tablet, Oral as needed) Active.  Past Surgical History Breast Mass; Local Excision  Date: 12/2010. right  Review of Systems General Not Present- Chills, Fatigue, Fever, Memory Loss, Night Sweats, Weight Gain and Weight Loss. Skin Present- Bruising. Not Present- Eczema, Hives, Itching, Lesions and Rash. HEENT Not Present- Dentures, Double Vision, Headache, Hearing Loss, Tinnitus and Visual Loss. Respiratory Not Present- Allergies, Chronic Cough, Coughing up blood, Shortness of breath at rest and Shortness of breath with exertion. Cardiovascular Present- Slow Heart Rate. Not Present- Chest Pain, Difficulty Breathing Lying Down, Murmur, Palpitations, Racing/skipping heartbeats  and  Swelling. Gastrointestinal Not Present- Abdominal Pain, Bloody Stool, Constipation, Diarrhea, Difficulty Swallowing, Heartburn, Jaundice, Loss of appetitie, Nausea and Vomiting. Female Genitourinary Not Present- Blood in Urine, Discharge, Flank Pain, Incontinence, Painful Urination, Urgency, Urinary frequency, Urinary Retention, Urinating at Night and Weak urinary stream. Musculoskeletal Present- Joint Stiffness. Not Present- Back Pain, Joint Pain, Joint Swelling, Morning Stiffness, Muscle Pain, Muscle Weakness and Spasms. Neurological Not Present- Blackout spells, Difficulty with balance, Dizziness, Paralysis, Tremor and Weakness. Psychiatric Not Present- Insomnia. Endocrine Present- Thyroid Problems. Hematology Present- Easy Bruising.  Vitals Weight: 154 lb Height: 64in Weight was reported by patient. Height was reported by patient. Body Surface Area: 1.75 m Body Mass Index: 26.43 kg/m  Pulse: 60 (Regular)  BP: 126/58 (Sitting, Left Arm, Standard) Occasional skipped beat   Physical Exam General Mental Status -Alert, cooperative and good historian. General Appearance-pleasant, Not in acute distress. Orientation-Oriented X3. Build & Nutrition-Well nourished and Well developed.  Head and Neck Head-normocephalic, atraumatic . Neck Global Assessment - supple, no bruit auscultated on the right, no bruit auscultated on the left.  Eye Vision-Wears corrective lenses. Pupil - Bilateral-Regular and Round. Motion - Bilateral-EOMI.  Chest and Lung Exam Auscultation Breath sounds - clear at anterior chest wall and clear at posterior chest wall. Adventitious sounds - No Adventitious sounds.  Cardiovascular Auscultation Rhythm - Regular rate and rhythm. Heart Sounds - S1 WNL and S2 WNL. Murmurs & Other Heart Sounds: Murmur 1 - Location - Aortic Area and Sternal Border - Left. Timing - Early systolic. Grade - II/VI. Character - Low  pitched.  Abdomen Palpation/Percussion Tenderness - Abdomen is non-tender to palpation. Rigidity (guarding) - Abdomen is soft. Auscultation Auscultation of the abdomen reveals - Bowel sounds normal.  Female Genitourinary Note: Not done, not pertinent to present illness   Musculoskeletal Note: Left hip can be flexed 110 rotated and 20 out 30 abducted 30 without discomfort. She has no pain on range of motion of the hip. Right hip can be flexed to 90 no internal rotation no external rotation 10 of abduction. There is pain on attempted range of motion of the right hip.  Radiographs AP pelvis AP and lateral left hip show the prosthesis on the left in excellent position with no periprosthetic abnormalities. On the right she has end-stage bone-on-bone arthritis with near ankylosis of the hip.  Assessment & Plan  S/P left hip joint replacement surgery (Z47.1, [id number])  Primary osteoarthritis of right hip (M16.11)  Note:Surgical Plans: Right Total Hip Replacement - Anterior Approach  Disposition: Home, HEP - Exercise Sheet Provided  PCP: Dr. [name]  Topical TXA  Anesthesia Issues: None  Patient was instructed on what medications to stop prior to surgery.  AVOID RIGHT ARM FOR IV'S AND BP'S  Signed electronically by Joelene Millin, III PA-C

## 2016-11-20 ENCOUNTER — Inpatient Hospital Stay (HOSPITAL_COMMUNITY): Payer: Medicare Other

## 2016-11-20 ENCOUNTER — Inpatient Hospital Stay (HOSPITAL_COMMUNITY): Payer: Medicare Other | Admitting: Anesthesiology

## 2016-11-20 ENCOUNTER — Encounter (HOSPITAL_COMMUNITY)
Admission: RE | Disposition: A | Discharge status: Home / Self Care | Payer: Self-pay | Source: Ambulatory Visit | Attending: Orthopedic Surgery

## 2016-11-20 ENCOUNTER — Inpatient Hospital Stay (HOSPITAL_COMMUNITY)
Admission: RE | Admit: 2016-11-20 | Discharge: 2016-11-21 | DRG: 470 | Disposition: A | Discharge status: Home / Self Care | Payer: Medicare Other | Source: Ambulatory Visit | Attending: Orthopedic Surgery | Admitting: Orthopedic Surgery

## 2016-11-20 ENCOUNTER — Encounter (HOSPITAL_COMMUNITY): Payer: Self-pay | Admitting: Anesthesiology

## 2016-11-20 DIAGNOSIS — Z923 Personal history of irradiation: Secondary | ICD-10-CM | POA: Diagnosis not present

## 2016-11-20 DIAGNOSIS — I10 Essential (primary) hypertension: Secondary | ICD-10-CM | POA: Diagnosis present

## 2016-11-20 DIAGNOSIS — Z96642 Presence of left artificial hip joint: Secondary | ICD-10-CM | POA: Diagnosis present

## 2016-11-20 DIAGNOSIS — M1611 Unilateral primary osteoarthritis, right hip: Secondary | ICD-10-CM

## 2016-11-20 DIAGNOSIS — E7800 Pure hypercholesterolemia, unspecified: Secondary | ICD-10-CM | POA: Diagnosis present

## 2016-11-20 DIAGNOSIS — Z791 Long term (current) use of non-steroidal anti-inflammatories (NSAID): Secondary | ICD-10-CM

## 2016-11-20 DIAGNOSIS — Z79891 Long term (current) use of opiate analgesic: Secondary | ICD-10-CM

## 2016-11-20 DIAGNOSIS — Z79899 Other long term (current) drug therapy: Secondary | ICD-10-CM

## 2016-11-20 DIAGNOSIS — M169 Osteoarthritis of hip, unspecified: Secondary | ICD-10-CM | POA: Diagnosis present

## 2016-11-20 DIAGNOSIS — Z8614 Personal history of Methicillin resistant Staphylococcus aureus infection: Secondary | ICD-10-CM | POA: Diagnosis not present

## 2016-11-20 DIAGNOSIS — Z853 Personal history of malignant neoplasm of breast: Secondary | ICD-10-CM

## 2016-11-20 DIAGNOSIS — E039 Hypothyroidism, unspecified: Secondary | ICD-10-CM | POA: Diagnosis present

## 2016-11-20 DIAGNOSIS — M25551 Pain in right hip: Secondary | ICD-10-CM

## 2016-11-20 DIAGNOSIS — Z96649 Presence of unspecified artificial hip joint: Secondary | ICD-10-CM

## 2016-11-20 HISTORY — PX: TOTAL HIP ARTHROPLASTY: SHX124

## 2016-11-20 LAB — COMPREHENSIVE METABOLIC PANEL
ALK PHOS: 94 U/L (ref 38–126)
ALT: 17 U/L (ref 14–54)
AST: 34 U/L (ref 15–41)
Albumin: 3.4 g/dL — ABNORMAL LOW (ref 3.5–5.0)
Anion gap: 12 (ref 5–15)
BUN: 12 mg/dL (ref 6–20)
CO2: 26 mmol/L (ref 22–32)
CREATININE: 0.86 mg/dL (ref 0.44–1.00)
Calcium: 9.2 mg/dL (ref 8.9–10.3)
Chloride: 99 mmol/L — ABNORMAL LOW (ref 101–111)
GFR calc non Af Amer: >60 mL/min (ref >60)
GLUCOSE: 218 mg/dL — AB (ref 65–99)
Potassium: 2.9 mmol/L — ABNORMAL LOW (ref 3.5–5.1)
SODIUM: 137 mmol/L (ref 135–145)
TOTAL PROTEIN: 6.4 g/dL — AB (ref 6.5–8.1)
Total Bilirubin: 0.5 mg/dL (ref 0.3–1.2)

## 2016-11-20 LAB — TYPE AND SCREEN
ABO/RH(D): AB POS
ANTIBODY SCREEN: NEGATIVE

## 2016-11-20 SURGERY — ARTHROPLASTY, HIP, TOTAL, ANTERIOR APPROACH
Anesthesia: Spinal | Site: Hip | Laterality: Right

## 2016-11-20 MED ORDER — BUPIVACAINE-EPINEPHRINE (PF) 0.25% -1:200000 IJ SOLN
INTRAMUSCULAR | Status: AC
  Filled 2016-11-20: qty 30

## 2016-11-20 MED ORDER — ACETAMINOPHEN 10 MG/ML IV SOLN
INTRAVENOUS | Status: AC
  Filled 2016-11-20: qty 100

## 2016-11-20 MED ORDER — ONDANSETRON HCL 4 MG PO TABS: 4.0000 mg | ORAL_TABLET | Freq: Four times a day (QID) | ORAL | Status: DC | PRN

## 2016-11-20 MED ORDER — HYDROMORPHONE HCL-NACL 0.5-0.9 MG/ML-% IV SOSY
PREFILLED_SYRINGE | INTRAVENOUS | Status: AC
  Filled 2016-11-20: qty 1

## 2016-11-20 MED ORDER — METOCLOPRAMIDE HCL 5 MG/ML IJ SOLN: 5.0000 mg | Freq: Three times a day (TID) | INTRAMUSCULAR | Status: DC | PRN

## 2016-11-20 MED ORDER — PROPOFOL 500 MG/50ML IV EMUL
INTRAVENOUS | Status: DC | PRN
  Administered 2016-11-20: 50 ug/kg/min via INTRAVENOUS

## 2016-11-20 MED ORDER — SIMVASTATIN 20 MG PO TABS
20.0000 mg | ORAL_TABLET | Freq: Every day | ORAL | Status: DC
  Administered 2016-11-21: 08:00:00 20 mg via ORAL
  Filled 2016-11-20: qty 1

## 2016-11-20 MED ORDER — PHENOL 1.4 % MT LIQD: 1.0000 | OROMUCOSAL | Status: DC | PRN

## 2016-11-20 MED ORDER — HYDROMORPHONE HCL-NACL 0.5-0.9 MG/ML-% IV SOSY
0.2500 mg | PREFILLED_SYRINGE | INTRAVENOUS | Status: DC | PRN
  Administered 2016-11-20 (×3): 0.5 mg via INTRAVENOUS

## 2016-11-20 MED ORDER — ONDANSETRON HCL 4 MG/2ML IJ SOLN: 4.0000 mg | Freq: Four times a day (QID) | INTRAMUSCULAR | Status: DC | PRN

## 2016-11-20 MED ORDER — POTASSIUM CHLORIDE 20 MEQ PO PACK: 40.0000 meq | PACK | Freq: Two times a day (BID) | ORAL | Status: DC

## 2016-11-20 MED ORDER — RIVAROXABAN 10 MG PO TABS
10.0000 mg | ORAL_TABLET | Freq: Every day | ORAL | Status: DC
  Administered 2016-11-21: 10 mg via ORAL
  Filled 2016-11-20: qty 1

## 2016-11-20 MED ORDER — CEFAZOLIN SODIUM-DEXTROSE 1-4 GM/50ML-% IV SOLN
1.0000 g | Freq: Four times a day (QID) | INTRAVENOUS | Status: AC
  Administered 2016-11-20 – 2016-11-21 (×2): 1 g via INTRAVENOUS
  Filled 2016-11-20 (×2): qty 50

## 2016-11-20 MED ORDER — FLEET ENEMA 7-19 GM/118ML RE ENEM: 1.0000 | ENEMA | Freq: Once | RECTAL | Status: DC | PRN

## 2016-11-20 MED ORDER — BUPIVACAINE HCL (PF) 0.25 % IJ SOLN
INTRAMUSCULAR | Status: DC | PRN
  Administered 2016-11-20: 30 mL

## 2016-11-20 MED ORDER — METHOCARBAMOL 1000 MG/10ML IJ SOLN
500.0000 mg | Freq: Four times a day (QID) | INTRAVENOUS | Status: DC | PRN
  Administered 2016-11-20: 500 mg via INTRAVENOUS
  Filled 2016-11-20: qty 550

## 2016-11-20 MED ORDER — ACETAMINOPHEN 650 MG RE SUPP: 650.0000 mg | Freq: Four times a day (QID) | RECTAL | Status: DC | PRN

## 2016-11-20 MED ORDER — POTASSIUM CHLORIDE CRYS ER 20 MEQ PO TBCR
40.0000 meq | EXTENDED_RELEASE_TABLET | Freq: Two times a day (BID) | ORAL | Status: DC
  Administered 2016-11-20: 40 meq via ORAL
  Filled 2016-11-20: qty 2

## 2016-11-20 MED ORDER — MORPHINE SULFATE (PF) 4 MG/ML IV SOLN: 1.0000 mg | INTRAVENOUS | Status: DC | PRN

## 2016-11-20 MED ORDER — ONDANSETRON HCL 4 MG/2ML IJ SOLN
INTRAMUSCULAR | Status: AC
  Filled 2016-11-20: qty 2

## 2016-11-20 MED ORDER — METOCLOPRAMIDE HCL 5 MG PO TABS: 5.0000 mg | ORAL_TABLET | Freq: Three times a day (TID) | ORAL | Status: DC | PRN

## 2016-11-20 MED ORDER — LEVOTHYROXINE SODIUM 50 MCG PO TABS
50.0000 ug | ORAL_TABLET | Freq: Every day | ORAL | Status: DC
  Administered 2016-11-21: 08:00:00 50 ug via ORAL
  Filled 2016-11-20: qty 1

## 2016-11-20 MED ORDER — ACETAMINOPHEN 325 MG PO TABS: 650.0000 mg | ORAL_TABLET | Freq: Four times a day (QID) | ORAL | Status: DC | PRN

## 2016-11-20 MED ORDER — TRANEXAMIC ACID 1000 MG/10ML IV SOLN
INTRAVENOUS | Status: AC | PRN
  Administered 2016-11-20: 2000 mg via TOPICAL

## 2016-11-20 MED ORDER — POLYETHYLENE GLYCOL 3350 17 G PO PACK: 17.0000 g | PACK | Freq: Every day | ORAL | Status: DC | PRN

## 2016-11-20 MED ORDER — DOCUSATE SODIUM 100 MG PO CAPS
100.0000 mg | ORAL_CAPSULE | Freq: Two times a day (BID) | ORAL | Status: DC
  Administered 2016-11-20 – 2016-11-21 (×2): 100 mg via ORAL
  Filled 2016-11-20 (×2): qty 1

## 2016-11-20 MED ORDER — PHENYLEPHRINE HCL 10 MG/ML IJ SOLN
INTRAVENOUS | Status: DC | PRN
  Administered 2016-11-20: 40 ug/min via INTRAVENOUS

## 2016-11-20 MED ORDER — MENTHOL 3 MG MT LOZG: 1.0000 | LOZENGE | OROMUCOSAL | Status: DC | PRN

## 2016-11-20 MED ORDER — TRAMADOL HCL 50 MG PO TABS: 50.0000 mg | ORAL_TABLET | Freq: Four times a day (QID) | ORAL | Status: DC | PRN

## 2016-11-20 MED ORDER — CHLORHEXIDINE GLUCONATE 4 % EX LIQD: 60.0000 mL | Freq: Once | CUTANEOUS | Status: DC

## 2016-11-20 MED ORDER — ACETAMINOPHEN 500 MG PO TABS
1000.0000 mg | ORAL_TABLET | Freq: Four times a day (QID) | ORAL | Status: AC
  Administered 2016-11-20 – 2016-11-21 (×4): 1000 mg via ORAL
  Filled 2016-11-20 (×4): qty 2

## 2016-11-20 MED ORDER — DEXAMETHASONE SODIUM PHOSPHATE 10 MG/ML IJ SOLN
10.0000 mg | Freq: Once | INTRAMUSCULAR | Status: AC
  Administered 2016-11-21: 08:00:00 10 mg via INTRAVENOUS
  Filled 2016-11-20: qty 1

## 2016-11-20 MED ORDER — BUPIVACAINE HCL 0.25 % IJ SOLN
INTRAMUSCULAR | Status: AC
  Filled 2016-11-20: qty 1

## 2016-11-20 MED ORDER — ONDANSETRON HCL 4 MG/2ML IJ SOLN: 4.0000 mg | Freq: Once | INTRAMUSCULAR | Status: DC | PRN

## 2016-11-20 MED ORDER — CHLORTHALIDONE 25 MG PO TABS: 25.0000 mg | ORAL_TABLET | Freq: Every day | ORAL | Status: DC

## 2016-11-20 MED ORDER — MEPERIDINE HCL 50 MG/ML IJ SOLN: 6.2500 mg | INTRAMUSCULAR | Status: DC | PRN

## 2016-11-20 MED ORDER — AMLODIPINE BESYLATE 5 MG PO TABS: 5.0000 mg | ORAL_TABLET | Freq: Every day | ORAL | Status: DC

## 2016-11-20 MED ORDER — METHOCARBAMOL 500 MG PO TABS: 500.0000 mg | ORAL_TABLET | Freq: Four times a day (QID) | ORAL | Status: DC | PRN

## 2016-11-20 MED ORDER — SODIUM CHLORIDE 0.9 % IV SOLN
INTRAVENOUS | Status: DC
  Administered 2016-11-20: 18:00:00 via INTRAVENOUS

## 2016-11-20 MED ORDER — 0.9 % SODIUM CHLORIDE (POUR BTL) OPTIME
TOPICAL | Status: DC | PRN
  Administered 2016-11-20: 1000 mL

## 2016-11-20 MED ORDER — CEFAZOLIN SODIUM-DEXTROSE 2-4 GM/100ML-% IV SOLN
INTRAVENOUS | Status: AC
  Filled 2016-11-20: qty 100

## 2016-11-20 MED ORDER — STERILE WATER FOR IRRIGATION IR SOLN
Status: DC | PRN
  Administered 2016-11-20: 2000 mL

## 2016-11-20 MED ORDER — PROPOFOL 10 MG/ML IV BOLUS
INTRAVENOUS | Status: AC
  Filled 2016-11-20: qty 60

## 2016-11-20 MED ORDER — DIPHENHYDRAMINE HCL 12.5 MG/5ML PO ELIX: 12.5000 mg | ORAL_SOLUTION | ORAL | Status: DC | PRN

## 2016-11-20 MED ORDER — TRANEXAMIC ACID 1000 MG/10ML IV SOLN
2000.0000 mg | Freq: Once | INTRAVENOUS | Status: AC
  Administered 2016-11-20: 14:00:00 via TOPICAL
  Filled 2016-11-20: qty 20

## 2016-11-20 MED ORDER — ACETAMINOPHEN 10 MG/ML IV SOLN
1000.0000 mg | Freq: Once | INTRAVENOUS | Status: AC
  Administered 2016-11-20: 1000 mg via INTRAVENOUS

## 2016-11-20 MED ORDER — PROPOFOL 10 MG/ML IV BOLUS
INTRAVENOUS | Status: DC | PRN
  Administered 2016-11-20: 30 mg via INTRAVENOUS

## 2016-11-20 MED ORDER — FENTANYL CITRATE (PF) 100 MCG/2ML IJ SOLN
INTRAMUSCULAR | Status: AC
  Filled 2016-11-20: qty 2

## 2016-11-20 MED ORDER — BISACODYL 10 MG RE SUPP: 10.0000 mg | Freq: Every day | RECTAL | Status: DC | PRN

## 2016-11-20 MED ORDER — BUPIVACAINE HCL (PF) 0.5 % IJ SOLN
INTRAMUSCULAR | Status: DC | PRN
  Administered 2016-11-20: 2.5 mL

## 2016-11-20 MED ORDER — DEXAMETHASONE SODIUM PHOSPHATE 10 MG/ML IJ SOLN
10.0000 mg | Freq: Once | INTRAMUSCULAR | Status: AC
  Administered 2016-11-20: 10 mg via INTRAVENOUS

## 2016-11-20 MED ORDER — LACTATED RINGERS IV SOLN
INTRAVENOUS | Status: DC
  Administered 2016-11-20: 12:00:00 via INTRAVENOUS

## 2016-11-20 MED ORDER — FENTANYL CITRATE (PF) 100 MCG/2ML IJ SOLN
INTRAMUSCULAR | Status: DC | PRN
  Administered 2016-11-20: 50 ug via INTRAVENOUS

## 2016-11-20 MED ORDER — DEXAMETHASONE SODIUM PHOSPHATE 10 MG/ML IJ SOLN
INTRAMUSCULAR | Status: AC
  Filled 2016-11-20: qty 1

## 2016-11-20 MED ORDER — OXYCODONE HCL 5 MG PO TABS
5.0000 mg | ORAL_TABLET | ORAL | Status: DC | PRN
  Administered 2016-11-20 – 2016-11-21 (×5): 10 mg via ORAL
  Filled 2016-11-20 (×5): qty 2

## 2016-11-20 MED ORDER — PHENYLEPHRINE HCL 10 MG/ML IJ SOLN
INTRAMUSCULAR | Status: AC
  Filled 2016-11-20: qty 1

## 2016-11-20 MED ORDER — CEFAZOLIN SODIUM-DEXTROSE 2-4 GM/100ML-% IV SOLN
2.0000 g | INTRAVENOUS | Status: AC
  Administered 2016-11-20: 2 g via INTRAVENOUS

## 2016-11-20 SURGICAL SUPPLY — 35 items
BAG DECANTER FOR FLEXI CONT (MISCELLANEOUS) IMPLANT
BAG ZIPLOCK 12X15 (MISCELLANEOUS) ×3 IMPLANT
BLADE SAG 18X100X1.27 (BLADE) ×3 IMPLANT
CAPT HIP TOTAL 2 ×3 IMPLANT
CLOSURE WOUND 1/2 X4 (GAUZE/BANDAGES/DRESSINGS) ×2
CLOTH BEACON ORANGE TIMEOUT ST (SAFETY) ×3 IMPLANT
COVER PERINEAL POST (MISCELLANEOUS) ×3 IMPLANT
COVER SURGICAL LIGHT HANDLE (MISCELLANEOUS) ×3 IMPLANT
DECANTER SPIKE VIAL GLASS SM (MISCELLANEOUS) ×3 IMPLANT
DRAPE STERI IOBAN 125X83 (DRAPES) ×3 IMPLANT
DRAPE U-SHAPE 47X51 STRL (DRAPES) ×6 IMPLANT
DRSG ADAPTIC 3X8 NADH LF (GAUZE/BANDAGES/DRESSINGS) ×3 IMPLANT
DRSG MEPILEX BORDER 4X4 (GAUZE/BANDAGES/DRESSINGS) ×3 IMPLANT
DRSG MEPILEX BORDER 4X8 (GAUZE/BANDAGES/DRESSINGS) ×3 IMPLANT
DURAPREP 26ML APPLICATOR (WOUND CARE) ×3 IMPLANT
ELECT REM PT RETURN 15FT ADLT (MISCELLANEOUS) ×3 IMPLANT
EVACUATOR 1/8 PVC DRAIN (DRAIN) ×3 IMPLANT
GLOVE BIO SURGEON STRL SZ7.5 (GLOVE) ×3 IMPLANT
GLOVE BIO SURGEON STRL SZ8 (GLOVE) ×3 IMPLANT
GLOVE BIOGEL PI IND STRL 8 (GLOVE) ×2 IMPLANT
GLOVE BIOGEL PI INDICATOR 8 (GLOVE) ×4
GOWN STRL REUS W/TWL LRG LVL3 (GOWN DISPOSABLE) ×3 IMPLANT
GOWN STRL REUS W/TWL XL LVL3 (GOWN DISPOSABLE) ×3 IMPLANT
PACK ANTERIOR HIP CUSTOM (KITS) ×3 IMPLANT
STRIP CLOSURE SKIN 1/2X4 (GAUZE/BANDAGES/DRESSINGS) ×4 IMPLANT
SUT ETHIBOND NAB CT1 #1 30IN (SUTURE) ×3 IMPLANT
SUT MNCRL AB 4-0 PS2 18 (SUTURE) ×3 IMPLANT
SUT STRATAFIX 0 PDS 27 VIOLET (SUTURE) ×3
SUT VIC AB 2-0 CT1 27 (SUTURE) ×4
SUT VIC AB 2-0 CT1 TAPERPNT 27 (SUTURE) ×2 IMPLANT
SUTURE STRATFX 0 PDS 27 VIOLET (SUTURE) ×1 IMPLANT
SYR 50ML LL SCALE MARK (SYRINGE) IMPLANT
TRAY FOLEY CATH 14FRSI W/METER (CATHETERS) ×3 IMPLANT
TRAY FOLEY W/METER SILVER 16FR (SET/KITS/TRAYS/PACK) IMPLANT
YANKAUER SUCT BULB TIP 10FT TU (MISCELLANEOUS) ×3 IMPLANT

## 2016-11-20 NOTE — Progress Notes (Signed)
X-ray results noted 

## 2016-11-20 NOTE — Anesthesia Postprocedure Evaluation (Signed)
Anesthesia Post Note  Patient: Molly Lambert  Procedure(s) Performed: Procedure(s) (LRB): RIGHT TOTAL HIP ARTHROPLASTY ANTERIOR APPROACH (Right)     Patient location during evaluation: PACU Anesthesia Type: Spinal Level of consciousness: oriented and awake and alert Pain management: pain level controlled Vital Signs Assessment: post-procedure vital signs reviewed and stable Respiratory status: spontaneous breathing, respiratory function stable and patient connected to nasal cannula oxygen Cardiovascular status: blood pressure returned to baseline and stable Postop Assessment: no headache, no backache and no apparent nausea or vomiting Anesthetic complications: no    Last Vitals:  Vitals:   11/20/16 1600 11/20/16 1601  BP: 126/73   Pulse: 63 61  Resp: 19 15  Temp: 36.6 C   SpO2: 100% 100%    Last Pain:  Vitals:   11/20/16 1601  TempSrc:   PainSc: 4                  [name] DAVID

## 2016-11-20 NOTE — Anesthesia Preprocedure Evaluation (Signed)
Anesthesia Evaluation  Patient identified by MRN, date of birth, ID band Patient awake    Reviewed: Allergy & Precautions, NPO status , Patient's Chart, lab work & pertinent test results  Airway Mallampati: I  TM Distance: >3 FB Neck ROM: Full    Dental   Pulmonary    Pulmonary exam normal        Cardiovascular hypertension, Pt. on medications Normal cardiovascular exam     Neuro/Psych    GI/Hepatic   Endo/Other    Renal/GU      Musculoskeletal   Abdominal   Peds  Hematology   Anesthesia Other Findings   Reproductive/Obstetrics                             Anesthesia Physical Anesthesia Plan  ASA: II  Anesthesia Plan: Spinal   Post-op Pain Management:    Induction: Intravenous  PONV Risk Score and Plan: 2 and Ondansetron and Dexamethasone  Airway Management Planned: Simple Face Mask  Additional Equipment:   Intra-op Plan:   Post-operative Plan:   Informed Consent: I have reviewed the patients History and Physical, chart, labs and discussed the procedure including the risks, benefits and alternatives for the proposed anesthesia with the patient or authorized representative who has indicated his/her understanding and acceptance.     Plan Discussed with: CRNA and Surgeon  Anesthesia Plan Comments:         Anesthesia Quick Evaluation

## 2016-11-20 NOTE — Anesthesia Procedure Notes (Signed)
Spinal  Patient location during procedure: OR Start time: 11/20/2016 12:35 PM End time: 11/20/2016 12:41 PM Reason for block: at surgeon's request Staffing Resident/CRNA: Anne Fu Performed: resident/CRNA  Preanesthetic Checklist Completed: patient identified, site marked, surgical consent, pre-op evaluation, timeout performed, IV checked, risks and benefits discussed and monitors and equipment checked Spinal Block Patient position: sitting Prep: DuraPrep Patient monitoring: heart rate, continuous pulse ox and blood pressure Approach: right paramedian Location: L2-3 Injection technique: single-shot Needle Needle type: Pencan  Needle gauge: 24 G Needle length: 9 cm Assessment Sensory level: T6 Additional Notes Expiration date of kit checked and confirmed. Patient tolerated procedure well, without complications. X 1 attempt with noted clear CSF return. Loss of motor and sensory on exam post injection.

## 2016-11-20 NOTE — Interval H&P Note (Signed)
History and Physical Interval Note:  11/20/2016 12:27 PM  Molly Lambert  has presented today for surgery, with the diagnosis of Osteoarthritis Right Hip  The various methods of treatment have been discussed with the patient and family. After consideration of risks, benefits and other options for treatment, the patient has consented to  Procedure(s): RIGHT TOTAL HIP ARTHROPLASTY ANTERIOR APPROACH (Right) as a surgical intervention .  The patient's history has been reviewed, patient examined, no change in status, stable for surgery.  I have reviewed the patient's chart and labs.  Questions were answered to the patient's satisfaction.     [name]

## 2016-11-20 NOTE — Op Note (Signed)
OPERATIVE REPORT- TOTAL HIP ARTHROPLASTY   PREOPERATIVE DIAGNOSIS: Osteoarthritis of the Right hip.   POSTOPERATIVE DIAGNOSIS: Osteoarthritis of the Right  hip.   PROCEDURE: Right total hip arthroplasty, anterior approach.   SURGEON: Gaynelle Arabian, MD   ASSISTANT: Arlee Muslim, PA-C  ANESTHESIA:  Spinal  ESTIMATED BLOOD LOSS:-250 ml    DRAINS: Hemovac x1.   COMPLICATIONS: None   CONDITION: PACU - hemodynamically stable.   BRIEF CLINICAL NOTE: Molly Lambert is a 82 y.o. female who has advanced end-  stage arthritis of their Right  hip with progressively worsening pain and  dysfunction.The patient has failed nonoperative management and presents for  total hip arthroplasty.   PROCEDURE IN DETAIL: After successful administration of spinal  anesthetic, the traction boots for the [facility] bed were placed on both  feet and the patient was placed onto the [facility] bed, boots placed into the leg  holders. The Right hip was then isolated from the perineum with plastic  drapes and prepped and draped in the usual sterile fashion. ASIS and  greater trochanter were marked and a oblique incision was made, starting  at about 1 cm lateral and 2 cm distal to the ASIS and coursing towards  the anterior cortex of the femur. The skin was cut with a 10 blade  through subcutaneous tissue to the level of the fascia overlying the  tensor fascia lata muscle. The fascia was then incised in line with the  incision at the junction of the anterior third and posterior 2/3rd. The  muscle was teased off the fascia and then the interval between the TFL  and the rectus was developed. The Hohmann retractor was then placed at  the top of the femoral neck over the capsule. The vessels overlying the  capsule were cauterized and the fat on top of the capsule was removed.  A Hohmann retractor was then placed anterior underneath the rectus  femoris to give exposure to the entire anterior capsule. A T-shaped   capsulotomy was performed. The edges were tagged and the femoral head  was identified.       Osteophytes are removed off the superior acetabulum.  The femoral neck was then cut in situ with an oscillating saw. Traction  was then applied to the left lower extremity utilizing the [facility]  traction. The femoral head was then removed. Retractors were placed  around the acetabulum and then circumferential removal of the labrum was  performed. Osteophytes were also removed. Reaming starts at 45 mm to  medialize and  Increased in 2 mm increments to 49 mm. We reamed in  approximately 40 degrees of abduction, 20 degrees anteversion. A 50 mm  pinnacle acetabular shell was then impacted in anatomic position under  fluoroscopic guidance with excellent purchase. We did not need to place  any additional dome screws. A 32 mm neutral + 4 marathon liner was then  placed into the acetabular shell.       The femoral lift was then placed along the lateral aspect of the femur  just distal to the vastus ridge. The leg was  externally rotated and capsule  was stripped off the inferior aspect of the femoral neck down to the  level of the lesser trochanter, this was done with electrocautery. The femur was lifted after this was performed. The  leg was then placed in an extended and adducted position essentially delivering the femur. We also removed the capsule superiorly and the piriformis from the piriformis  fossa to gain excellent exposure of the  proximal femur. Rongeur was used to remove some cancellous bone to get  into the lateral portion of the proximal femur for placement of the  initial starter reamer. The starter broaches was placed  the starter broach  and was shown to go down the center of the canal. Broaching  with the  Corail system was then performed starting at size 8, coursing  Up to size 10. A size 10 had excellent torsional and rotational  and axial stability. The trial high offset neck was then  placed  with a 32 + 1 trial head. The hip was then reduced. We confirmed that  the stem was in the canal both on AP and lateral x-rays. It also has excellent sizing. The hip was reduced with outstanding stability through full extension and full external rotation.. AP pelvis was taken and the leg lengths were measured and found to be equal. Hip was then dislocated again and the femoral head and neck removed. The  femoral broach was removed. Size 10 Corail stem with a high offset  neck was then impacted into the femur following native anteversion. Has  excellent purchase in the canal. Excellent torsional and rotational and  axial stability. It is confirmed to be in the canal on AP and lateral  fluoroscopic views. The 32 + 1 ceramic head was placed and the hip  reduced with outstanding stability. Again AP pelvis was taken and it  confirmed that the leg lengths were equal. The wound was then copiously  irrigated with saline solution and the capsule reattached and repaired  with Ethibond suture. 30 ml of .25% Bupivicaine was  injected into the capsule and into the edge of the tensor fascia lata as well as subcutaneous tissue. The fascia overlying the tensor fascia lata was then closed with a running #1 V-Loc. Subcu was closed with interrupted 2-0 Vicryl and subcuticular running 4-0 Monocryl. Incision was cleaned  and dried. Steri-Strips and a bulky sterile dressing applied. Hemovac  drain was hooked to suction and then the patient was awakened and transported to  recovery in stable condition.        Please note that a surgical assistant was a medical necessity for this procedure to perform it in a safe and expeditious manner. Assistant was necessary to provide appropriate retraction of vital neurovascular structures and to prevent femoral fracture and allow for anatomic placement of the prosthesis.  Gaynelle Arabian, M.D.

## 2016-11-20 NOTE — H&P (View-Only) (Signed)
Molly Lambert DOB: 1935-02-21 Married / Language: English / Race: White Female Date of admission: November 20, 2016  Chief complaint: right hip pain History of Present Illness The patient is a 82 year old female who comes in  for a preoperative History and Physical. The patient is scheduled for a right total hip arthroplasty (anterior) to be performed by Dr. Dione Plover. Aluisio, MD at [facility] on 11/20/2016. The patient is a 82 year old female presenting a couple of month outs from the left total hip arthroplasty. The patient states that she is doing very well at this time. The pain is under excellent control at this time and describes their pain as no pain. They are currently on Ultram for their pain. The patient is currently doing home exercise program. The patient feels that they are progressing well at this time. The patient continues to have issues w/ the right hip-catching, intense pain. Taking Tramadol and Robaxin, her left hip is doing great at this time but the right hip is increasingly problematic. She feels like she cannot fully recover because of the right hip pain. She has documented advanced arthritis of that right hip. She would like to get the right hip replaced now at this time. They have been treated conservatively in the past for the above stated problem and despite conservative measures, they continue to have progressive pain and severe functional limitations and dysfunction. They have failed non-operative management including home exercise and medications. It is felt that they would benefit from undergoing total joint replacement. Risks and benefits of the procedure have been discussed with the patient and they elect to proceed with surgery. There are no active contraindications to surgery such as ongoing infection or rapidly progressive neurological disease.   Problem List/Past Medical Primary osteoarthritis hip Breast Cancer  Right-sided; Radiation Therapy High  blood pressure  Hypercholesterolemia  Hypothyroidism  Varicose veins  Mumps  Measles  Positive MRSA Nasal Swab  2012 Hemorrhoids   Allergies No Known Drug Allergies   Family History  Cancer  Sister. Cerebrovascular Accident  Mother. Drug / Alcohol Addiction  child First Degree Relatives  reported Heart Disease  Father. Hypertension  Father, Mother. Kidney disease  Mother.  Social History  Children  2 Current work status  retired Furniture conservator/restorer weekly; does other Living situation  live with spouse Marital status  married Never consumed alcohol  05/07/2016: Never consumed alcohol No history of drug/alcohol rehab  Not under pain contract  Number of flights of stairs before winded  greater than 5 Tobacco use  Never smoker. 05/07/2016 Advance Directives  Living Will. Post-Surgical Plans  Home With Family.  Medication History Tylenol (500MG  Capsule, Oral) Active. Chlorthalidone (25MG  Tablet, Oral daily) Active. Levothyroxine Sodium (50MCG Tablet, Oral once a day before breakfast) Active. AmLODIPine Besylate (5MG  Tablet, Oral daily) Active. Simvastatin (20MG  Tablet, Oral QHS) Active. TraMADol HCl (50MG  Tablet, Oral as needed) Active. Ibuprofen (200MG  Tablet, Oral as needed) Active.  Past Surgical History Breast Mass; Local Excision  Date: 12/2010. right  Review of Systems General Not Present- Chills, Fatigue, Fever, Memory Loss, Night Sweats, Weight Gain and Weight Loss. Skin Present- Bruising. Not Present- Eczema, Hives, Itching, Lesions and Rash. HEENT Not Present- Dentures, Double Vision, Headache, Hearing Loss, Tinnitus and Visual Loss. Respiratory Not Present- Allergies, Chronic Cough, Coughing up blood, Shortness of breath at rest and Shortness of breath with exertion. Cardiovascular Present- Slow Heart Rate. Not Present- Chest Pain, Difficulty Breathing Lying Down, Murmur, Palpitations, Racing/skipping heartbeats  and  Swelling. Gastrointestinal Not Present- Abdominal Pain, Bloody Stool, Constipation, Diarrhea, Difficulty Swallowing, Heartburn, Jaundice, Loss of appetitie, Nausea and Vomiting. Female Genitourinary Not Present- Blood in Urine, Discharge, Flank Pain, Incontinence, Painful Urination, Urgency, Urinary frequency, Urinary Retention, Urinating at Night and Weak urinary stream. Musculoskeletal Present- Joint Stiffness. Not Present- Back Pain, Joint Pain, Joint Swelling, Morning Stiffness, Muscle Pain, Muscle Weakness and Spasms. Neurological Not Present- Blackout spells, Difficulty with balance, Dizziness, Paralysis, Tremor and Weakness. Psychiatric Not Present- Insomnia. Endocrine Present- Thyroid Problems. Hematology Present- Easy Bruising.  Vitals Weight: 154 lb Height: 64in Weight was reported by patient. Height was reported by patient. Body Surface Area: 1.75 m Body Mass Index: 26.43 kg/m  Pulse: 60 (Regular)  BP: 126/58 (Sitting, Left Arm, Standard) Occasional skipped beat   Physical Exam General Mental Status -Alert, cooperative and good historian. General Appearance-pleasant, Not in acute distress. Orientation-Oriented X3. Build & Nutrition-Well nourished and Well developed.  Head and Neck Head-normocephalic, atraumatic . Neck Global Assessment - supple, no bruit auscultated on the right, no bruit auscultated on the left.  Eye Vision-Wears corrective lenses. Pupil - Bilateral-Regular and Round. Motion - Bilateral-EOMI.  Chest and Lung Exam Auscultation Breath sounds - clear at anterior chest wall and clear at posterior chest wall. Adventitious sounds - No Adventitious sounds.  Cardiovascular Auscultation Rhythm - Regular rate and rhythm. Heart Sounds - S1 WNL and S2 WNL. Murmurs & Other Heart Sounds: Murmur 1 - Location - Aortic Area and Sternal Border - Left. Timing - Early systolic. Grade - II/VI. Character - Low  pitched.  Abdomen Palpation/Percussion Tenderness - Abdomen is non-tender to palpation. Rigidity (guarding) - Abdomen is soft. Auscultation Auscultation of the abdomen reveals - Bowel sounds normal.  Female Genitourinary Note: Not done, not pertinent to present illness   Musculoskeletal Note: Left hip can be flexed 110 rotated and 20 out 30 abducted 30 without discomfort. She has no pain on range of motion of the hip. Right hip can be flexed to 90 no internal rotation no external rotation 10 of abduction. There is pain on attempted range of motion of the right hip.  Radiographs AP pelvis AP and lateral left hip show the prosthesis on the left in excellent position with no periprosthetic abnormalities. On the right she has end-stage bone-on-bone arthritis with near ankylosis of the hip.  Assessment & Plan  S/P left hip joint replacement surgery (Z47.1, [id number])  Primary osteoarthritis of right hip (M16.11)  Note:Surgical Plans: Right Total Hip Replacement - Anterior Approach  Disposition: Home, HEP - Exercise Sheet Provided  PCP: Dr. [name]  Topical TXA  Anesthesia Issues: None  Patient was instructed on what medications to stop prior to surgery.  AVOID RIGHT ARM FOR IV'S AND BP'S  Signed electronically by Joelene Millin, III PA-C

## 2016-11-20 NOTE — Transfer of Care (Signed)
Immediate Anesthesia Transfer of Care Note  Patient: Molly Lambert  Procedure(s) Performed: Procedure(s): RIGHT TOTAL HIP ARTHROPLASTY ANTERIOR APPROACH (Right)  Patient Location: PACU  Anesthesia Type:Spinal  Level of Consciousness:  sedated, patient cooperative and responds to stimulation  Airway & Oxygen Therapy:Patient Spontanous Breathing and Patient connected to face mask oxgen  Post-op Assessment:  Report given to PACU RN and Post -op Vital signs reviewed and stable  Post vital signs:  Reviewed and stable  Last Vitals:  Vitals:   11/20/16 1147  BP: (!) 172/80  Pulse: 70  Resp: 18  Temp: 36.8 C  SpO2: 36%    Complications: No apparent anesthesia complications

## 2016-11-20 NOTE — Progress Notes (Signed)
Portable AP Pelvis X-ray done. 

## 2016-11-21 ENCOUNTER — Encounter (HOSPITAL_COMMUNITY): Payer: Self-pay | Admitting: Orthopedic Surgery

## 2016-11-21 LAB — BASIC METABOLIC PANEL
ANION GAP: 9 (ref 5–15)
BUN: 11 mg/dL (ref 6–20)
CHLORIDE: 102 mmol/L (ref 101–111)
CO2: 27 mmol/L (ref 22–32)
CREATININE: 0.67 mg/dL (ref 0.44–1.00)
Calcium: 8.9 mg/dL (ref 8.9–10.3)
GFR calc Af Amer: >60 mL/min (ref >60)
GFR calc non Af Amer: >60 mL/min (ref >60)
GLUCOSE: 135 mg/dL — AB (ref 65–99)
POTASSIUM: 3.2 mmol/L — AB (ref 3.5–5.1)
Sodium: 138 mmol/L (ref 135–145)

## 2016-11-21 LAB — CBC
HEMATOCRIT: 31.6 % — AB (ref 36.0–46.0)
Hemoglobin: 10.8 g/dL — ABNORMAL LOW (ref 12.0–15.0)
MCH: 29.8 pg (ref 26.0–34.0)
MCHC: 34.2 g/dL (ref 30.0–36.0)
MCV: 87.3 fL (ref 78.0–100.0)
PLATELETS: 277 10*3/uL (ref 150–400)
RBC: 3.62 MIL/uL — AB (ref 3.87–5.11)
RDW: 13.3 % (ref 11.5–15.5)
WBC: 12 10*3/uL — AB (ref 4.0–10.5)

## 2016-11-21 MED ORDER — TRAMADOL HCL 50 MG PO TABS: 50.0000 mg | ORAL_TABLET | Freq: Four times a day (QID) | ORAL | 0 refills | Status: DC | PRN

## 2016-11-21 MED ORDER — RIVAROXABAN 10 MG PO TABS: 10.0000 mg | ORAL_TABLET | Freq: Every day | ORAL | 0 refills | Status: DC

## 2016-11-21 MED ORDER — OXYCODONE HCL 5 MG PO TABS: 5.0000 mg | ORAL_TABLET | ORAL | 0 refills | Status: DC | PRN

## 2016-11-21 MED ORDER — POTASSIUM CHLORIDE CRYS ER 20 MEQ PO TBCR
40.0000 meq | EXTENDED_RELEASE_TABLET | ORAL | Status: AC
  Administered 2016-11-21 (×3): 40 meq via ORAL
  Filled 2016-11-21 (×3): qty 2

## 2016-11-21 MED ORDER — METHOCARBAMOL 500 MG PO TABS
500.0000 mg | ORAL_TABLET | Freq: Four times a day (QID) | ORAL | 0 refills | Status: DC | PRN
Start: 2016-11-21 — End: 2017-10-21

## 2016-11-21 NOTE — Progress Notes (Signed)
Discharge plan confirmed with patient for home exercise program. Patient states she has a RW and 3in1 at home. Marney Doctor RN,BSN,NCM [phone]

## 2016-11-21 NOTE — Discharge Summary (Signed)
Physician Discharge Summary   Patient ID: Molly Lambert MRN: [id number] DOB/AGE: 1934/11/09 82 y.o.  Admit date: 11/20/2016 Discharge date: 11/21/2016  Primary Diagnosis:  Osteoarthritis of the Right hip.   Admission Diagnoses:  Past Medical History:  Diagnosis Date  . Arthritis   . Breast cancer ([place]) 11/2010   Rt breast  . Cataract   . Erythema    reddened area of epidermis spnning concentrated around sinuses on the face; pt reports cracking, itchiness, and flaking  of affected skin; seen derm no absolute dx, will get 2nd opinion   . Hypercholesteremia   . Hypertension    NO CARDIAC MD,  [facility]  PCP  ,  URGENT MED  . Hypothyroidism   . Leg laceration    WITH SURGERY  . Paget's carcinoma of the nipple, right (HCC)    nipple removed  . S/P radiation therapy 03/14/11 - 04/05/11   Right Breast / 4256 cGy in 16 Fractions  . Thyroid disease    Discharge Diagnoses:   Active Problems:   OA (osteoarthritis) of hip  Estimated body mass index is 26.26 kg/m as calculated from the following:   Height as of this encounter: _0  (1.626 m).   Weight as of this encounter: 69.4 kg (153 lb).  Procedure(s) (LRB): RIGHT TOTAL HIP ARTHROPLASTY ANTERIOR APPROACH (Right)   Consults: None  HPI: Molly Lambert is a 82 y.o. female who has advanced end-  stage arthritis of their Right  hip with progressively worsening pain and  dysfunction.The patient has failed nonoperative management and presents for  total hip arthroplasty.   Laboratory Data: Admission on 11/20/2016  Component Date Value Ref Range Status  . WBC 11/21/2016 12.0* 4.0 - 10.5 K/uL Final  . RBC 11/21/2016 3.62* 3.87 - 5.11 MIL/uL Final  . Hemoglobin 11/21/2016 10.8* 12.0 - 15.0 g/dL Final  . HCT 11/21/2016 31.6* 36.0 - 46.0 % Final  . MCV 11/21/2016 87.3  78.0 - 100.0 fL Final  . MCH 11/21/2016 29.8  26.0 - 34.0 pg Final  . MCHC 11/21/2016 34.2  30.0 - 36.0 g/dL Final  . RDW 11/21/2016 13.3  11.5 - 15.5 % Final  .  Platelets 11/21/2016 277  150 - 400 K/uL Final  . Sodium 11/21/2016 138  135 - 145 mmol/L Final  . Potassium 11/21/2016 3.2* 3.5 - 5.1 mmol/L Final  . Chloride 11/21/2016 102  101 - 111 mmol/L Final  . CO2 11/21/2016 27  22 - 32 mmol/L Final  . Glucose, Bld 11/21/2016 135* 65 - 99 mg/dL Final  . BUN 11/21/2016 11  6 - 20 mg/dL Final  . Creatinine, Ser 11/21/2016 0.67  0.44 - 1.00 mg/dL Final  . Calcium 11/21/2016 8.9  8.9 - 10.3 mg/dL Final  . GFR calc non Af Amer 11/21/2016 >60  >60 mL/min Final  . GFR calc Af Amer 11/21/2016 >60  >60 mL/min Final   Comment: (NOTE) The eGFR has been calculated using the CKD EPI equation. This calculation has not been validated in all clinical situations. eGFR's persistently <60 mL/min signify possible Chronic Kidney Disease.   . Anion gap 11/21/2016 9  5 - 15 Final  . Sodium 11/20/2016 137  135 - 145 mmol/L Final  . Potassium 11/20/2016 2.9* 3.5 - 5.1 mmol/L Final  . Chloride 11/20/2016 99* 101 - 111 mmol/L Final  . CO2 11/20/2016 26  22 - 32 mmol/L Final  . Glucose, Bld 11/20/2016 218* 65 - 99 mg/dL Final  . BUN 11/20/2016 12  6 - 20 mg/dL Final  . Creatinine, Ser 11/20/2016 0.86  0.44 - 1.00 mg/dL Final  . Calcium 11/20/2016 9.2  8.9 - 10.3 mg/dL Final  . Total Protein 11/20/2016 6.4* 6.5 - 8.1 g/dL Final  . Albumin 11/20/2016 3.4* 3.5 - 5.0 g/dL Final  . AST 11/20/2016 34  15 - 41 U/L Final  . ALT 11/20/2016 17  14 - 54 U/L Final  . Alkaline Phosphatase 11/20/2016 94  38 - 126 U/L Final  . Total Bilirubin 11/20/2016 0.5  0.3 - 1.2 mg/dL Final  . GFR calc non Af Amer 11/20/2016 >60  >60 mL/min Final  . GFR calc Af Amer 11/20/2016 >60  >60 mL/min Final   Comment: (NOTE) The eGFR has been calculated using the CKD EPI equation. This calculation has not been validated in all clinical situations. eGFR's persistently <60 mL/min signify possible Chronic Kidney Disease.   Georgiann Hahn gap 11/20/2016 12  5 - 15 Final  Hospital Outpatient Visit on  11/11/2016  Component Date Value Ref Range Status  . aPTT 11/11/2016 27  24 - 36 seconds Final  . WBC 11/11/2016 6.4  4.0 - 10.5 K/uL Final  . RBC 11/11/2016 3.97  3.87 - 5.11 MIL/uL Final  . Hemoglobin 11/11/2016 11.8* 12.0 - 15.0 g/dL Final  . HCT 11/11/2016 35.1* 36.0 - 46.0 % Final  . MCV 11/11/2016 88.4  78.0 - 100.0 fL Final  . MCH 11/11/2016 29.7  26.0 - 34.0 pg Final  . MCHC 11/11/2016 33.6  30.0 - 36.0 g/dL Final  . RDW 11/11/2016 13.5  11.5 - 15.5 % Final  . Platelets 11/11/2016 320  150 - 400 K/uL Final  . Prothrombin Time 11/11/2016 12.7  11.4 - 15.2 seconds Final  . INR 11/11/2016 0.96   Final  . ABO/RH(D) 11/11/2016 AB POS   Final  . Antibody Screen 11/11/2016 NEG   Final  . Sample Expiration 11/11/2016 11/23/2016   Final  . Extend sample reason 11/11/2016 NO TRANSFUSIONS OR PREGNANCY IN THE PAST 3 MONTHS   Final  . MRSA, PCR 11/11/2016 NEGATIVE  NEGATIVE Final  . Staphylococcus aureus 11/11/2016 NEGATIVE  NEGATIVE Final   Comment: (NOTE) The Xpert SA Assay (FDA approved for NASAL specimens in patients 61 years of age and older), is one component of a comprehensive surveillance program. It is not intended to diagnose infection nor to guide or monitor treatment.   Office Visit on 10/24/2016  Component Date Value Ref Range Status  . Glucose 10/24/2016 113* 65 - 99 mg/dL Final  . BUN 10/24/2016 22  8 - 27 mg/dL Final  . Creatinine, Ser 10/24/2016 0.83  0.57 - 1.00 mg/dL Final  . GFR calc non Af Amer 10/24/2016 67  >59 mL/min/1.73 Final  . GFR calc Af Amer 10/24/2016 77  >59 mL/min/1.73 Final  . BUN/Creatinine Ratio 10/24/2016 27  12 - 28 Final  . Sodium 10/24/2016 138  134 - 144 mmol/L Final  . Potassium 10/24/2016 3.5  3.5 - 5.2 mmol/L Final  . Chloride 10/24/2016 96  96 - 106 mmol/L Final  . CO2 10/24/2016 26  20 - 29 mmol/L Final  . Calcium 10/24/2016 10.0  8.7 - 10.3 mg/dL Final  . Total Protein 10/24/2016 7.2  6.0 - 8.5 g/dL Final  . Albumin 10/24/2016 4.3  3.5  - 4.7 g/dL Final  . Globulin, Total 10/24/2016 2.9  1.5 - 4.5 g/dL Final  . Albumin/Globulin Ratio 10/24/2016 1.5  1.2 - 2.2 Final  . Bilirubin Total  10/24/2016 0.6  0.0 - 1.2 mg/dL Final  . Alkaline Phosphatase 10/24/2016 127* 39 - 117 IU/L Final  . AST 10/24/2016 18  0 - 40 IU/L Final  . ALT 10/24/2016 10  0 - 32 IU/L Final  . Cholesterol, Total 10/24/2016 231* 100 - 199 mg/dL Final  . Triglycerides 10/24/2016 171* 0 - 149 mg/dL Final  . HDL 10/24/2016 62  >39 mg/dL Final  . VLDL Cholesterol Cal 10/24/2016 34  5 - 40 mg/dL Final  . LDL Calculated 10/24/2016 135* 0 - 99 mg/dL Final  . Chol/HDL Ratio 10/24/2016 3.7  0.0 - 4.4 ratio Final   Comment:                                   T. Chol/HDL Ratio                                             Men  Women                               1/2 Avg.Risk  3.4    3.3                                   Avg.Risk  5.0    4.4                                2X Avg.Risk  9.6    7.1                                3X Avg.Risk 23.4   11.0   . TSH 10/24/2016 0.617  0.450 - 4.500 uIU/mL Final  Admission on 10/13/2016, Discharged on 10/13/2016  Component Date Value Ref Range Status  . Glucose, UA 10/13/2016 NEGATIVE  NEGATIVE mg/dL Final  . Bilirubin Urine 10/13/2016 NEGATIVE  NEGATIVE Final  . Ketones, ur 10/13/2016 NEGATIVE  NEGATIVE mg/dL Final  . Specific Gravity, Urine 10/13/2016 1.020  1.005 - 1.030 Final  . Hgb urine dipstick 10/13/2016 TRACE* NEGATIVE Final  . pH 10/13/2016 7.0  5.0 - 8.0 Final  . Protein, ur 10/13/2016 NEGATIVE  NEGATIVE mg/dL Final  . Urobilinogen, UA 10/13/2016 1.0  0.0 - 1.0 mg/dL Final  . Nitrite 10/13/2016 NEGATIVE  NEGATIVE Final  . Leukocytes, UA 10/13/2016 NEGATIVE  NEGATIVE Final   Biochemical Testing Only. Please order routine urinalysis from main lab if confirmatory testing is needed.     X-Rays:Dg Pelvis Portable  Result Date: 11/20/2016 CLINICAL DATA:  Status post right hip arthroplasty. EXAM: PORTABLE PELVIS 1-2  VIEWS COMPARISON:  Radiographs of Jul 17, 2016. FINDINGS: Previously noted left hip arthroplasty is again noted. Interval placement of total right hip arthroplasty. The femoral and acetabular components appear to be well situated. Surgical drain in other expected postoperative changes are noted in the surrounding soft tissues. No fracture or dislocation is noted. IMPRESSION: Interval placement of right total hip arthroplasty. Electronically Signed   By: Marijo Conception, M.D.   On: 11/20/2016 14:58   Dg C-arm 1-60 Min-no Report  Result Date: 11/20/2016 Fluoroscopy was utilized by the requesting physician.  No radiographic interpretation.    EKG: Orders placed or performed during the hospital encounter of 07/17/16  . EKG 12-Lead  . EKG 12-Lead     Hospital Course: Patient was admitted to [facility] and taken to the OR and underwent the above state procedure without complications.  Patient tolerated the procedure well and was later transferred to the recovery room and then to the orthopaedic floor for postoperative care.  They were given PO and IV analgesics for pain control following their surgery.  They were given 24 hours of postoperative antibiotics of  Anti-infectives    Start     Dose/Rate Route Frequency Ordered Stop   11/20/16 1830  ceFAZolin (ANCEF) IVPB 1 g/50 mL premix     1 g 100 mL/hr over 30 Minutes Intravenous Every 6 hours 11/20/16 1729 11/21/16 0046   11/20/16 1136  ceFAZolin (ANCEF) 2-4 GM/100ML-% IVPB    Comments:  Waldron Session   : cabinet override      11/20/16 1136 11/20/16 1246   11/20/16 1133  ceFAZolin (ANCEF) IVPB 2g/100 mL premix     2 g 200 mL/hr over 30 Minutes Intravenous On call to O.R. 11/20/16 1133 11/20/16 1246     and started on DVT prophylaxis in the form of Xarelto.   PT and OT were ordered for total hip protocol.  The patient was allowed to be WBAT with therapy. Discharge planning was consulted to help with postop disposition and equipment  needs.  Patient had a good night on the evening of surgery.  They started to get up OOB with therapy on day one.  Hemovac drain was pulled without difficulty. Dressing was checked and was clean and dry. Patient was seen in rounds by Dr. Wynelle Link and it was felt that as long as they did well with therapy, they would be ready to go home late that same day.   Please note that the patient was hypokalemic upon admission and was given K+ supplementation with improvement.  She is on a diuretic at home and probably runs chronically low on her K+ level.  Recommend that she follow up with her PCP for recheck of labs and consideration of supplementation or med changes.   Diet: Cardiac diet Activity:WBAT Follow-up:in 2 weeks Disposition - Home Discharged Condition: good   Discharge Instructions    Call MD / Call 911    Complete by:  As directed    If you experience chest pain or shortness of breath, CALL 911 and be transported to the hospital emergency room.  If you develope a fever above 101 F, pus (white drainage) or increased drainage or redness at the wound, or calf pain, call your surgeon's office.   Change dressing    Complete by:  As directed    You may change your dressing dressing daily with sterile 4 x 4 inch gauze dressing and paper tape.  Do not submerge the incision under water.   Constipation Prevention    Complete by:  As directed    Drink plenty of fluids.  Prune juice may be helpful.  You may use a stool softener, such as Colace (over the counter) 100 mg twice a day.  Use MiraLax (over the counter) for constipation as needed.   Diet - low sodium heart healthy    Complete by:  As directed    Discharge instructions    Complete by:  As directed  Take Xarelto for two and a half more weeks, then discontinue Xarelto. Once the patient has completed the blood thinner regimen, then take a Baby 81 mg Aspirin daily for three more weeks.   Pick up stool softner and laxative for home use  following surgery while on pain medications. Do not submerge incision under water. Please use good Rule washing techniques while changing dressing each day. May shower starting three days after surgery. Please use a clean towel to pat the incision dry following showers. Continue to use ice for pain and swelling after surgery. Do not use any lotions or creams on the incision until instructed by your surgeon.  Wear both TED hose on both legs during the day every day for three weeks, but may remove the TED hose at night at home.  Postoperative Constipation Protocol  Constipation - defined medically as fewer than three stools per week and severe constipation as less than one stool per week.  One of the most common issues patients have following surgery is constipation.  Even if you have a regular bowel pattern at home, your normal regimen is likely to be disrupted due to multiple reasons following surgery.  Combination of anesthesia, postoperative narcotics, change in appetite and fluid intake all can affect your bowels.  In order to avoid complications following surgery, here are some recommendations in order to help you during your recovery period.  Colace (docusate) - Pick up an over-the-counter form of Colace or another stool softener and take twice a day as long as you are requiring postoperative pain medications.  Take with a full glass of water daily.  If you experience loose stools or diarrhea, hold the colace until you stool forms back up.  If your symptoms do not get better within 1 week or if they get worse, check with your doctor.  Dulcolax (bisacodyl) - Pick up over-the-counter and take as directed by the product packaging as needed to assist with the movement of your bowels.  Take with a full glass of water.  Use this product as needed if not relieved by Colace only.   MiraLax (polyethylene glycol) - Pick up over-the-counter to have on [place].  MiraLax is a solution that will increase the  amount of water in your bowels to assist with bowel movements.  Take as directed and can mix with a glass of water, juice, soda, coffee, or tea.  Take if you go more than two days without a movement. Do not use MiraLax more than once per day. Call your doctor if you are still constipated or irregular after using this medication for 7 days in a row.  If you continue to have problems with postoperative constipation, please contact the office for further assistance and recommendations.  If you experience "the worst abdominal pain ever" or develop nausea or vomiting, please contact the office immediatly for further recommendations for treatment.   Do not sit on low chairs, stoools or toilet seats, as it may be difficult to get up from low surfaces    Complete by:  As directed    Driving restrictions    Complete by:  As directed    No driving until released by the physician.   Increase activity slowly as tolerated    Complete by:  As directed    Lifting restrictions    Complete by:  As directed    No lifting until released by the physician.   Patient may shower    Complete by:  As directed  You may shower without a dressing once there is no drainage.  Do not wash over the wound.  If drainage remains, do not shower until drainage stops.   TED hose    Complete by:  As directed    Use stockings (TED hose) for 3 weeks on both leg(s).  You may remove them at night for sleeping.   Weight bearing as tolerated    Complete by:  As directed    Laterality:  right   Extremity:  Lower     Allergies as of 11/21/2016   No Known Allergies     Medication List    STOP taking these medications   ibuprofen 200 MG tablet Commonly known as:  ADVIL,MOTRIN     TAKE these medications   acetaminophen 500 MG tablet Commonly known as:  TYLENOL Take 1,000 mg by mouth every 8 (eight) hours as needed for mild pain or headache.   amLODipine 5 MG tablet Commonly known as:  NORVASC Take 1 tablet (5 mg total)  by mouth daily.   chlorthalidone 25 MG tablet Commonly known as:  HYGROTON Take 1 tablet (25 mg total) by mouth daily.   levothyroxine 50 MCG tablet Commonly known as:  SYNTHROID, LEVOTHROID Take 1 tablet (50 mcg total) by mouth daily before breakfast.   methocarbamol 500 MG tablet Commonly known as:  ROBAXIN Take 1 tablet (500 mg total) by mouth every 6 (six) hours as needed for muscle spasms.   oxyCODONE 5 MG immediate release tablet Commonly known as:  Oxy IR/ROXICODONE Take 1-2 tablets (5-10 mg total) by mouth every 4 (four) hours as needed for moderate pain or severe pain.   rivaroxaban 10 MG Tabs tablet Commonly known as:  XARELTO Take 1 tablet (10 mg total) by mouth daily with breakfast. Take Xarelto for two and a half more weeks following discharge from the hospital, then discontinue Xarelto. Once the patient has completed the blood thinner regimen, then take a Baby 81 mg Aspirin daily for three more weeks.   simvastatin 20 MG tablet Commonly known as:  ZOCOR Take 1 tablet (20 mg total) by mouth daily.   traMADol 50 MG tablet Commonly known as:  ULTRAM Take 1-2 tablets (50-100 mg total) by mouth every 6 (six) hours as needed for moderate pain. What changed:  how much to take  when to take this  reasons to take this            Discharge Care Instructions        Start     Ordered   11/22/16 0000  rivaroxaban (XARELTO) 10 MG TABS tablet  Daily with breakfast    Question:  Supervising Provider  Answer:  Gaynelle Arabian   11/21/16 0842   11/21/16 0000  methocarbamol (ROBAXIN) 500 MG tablet  Every 6 hours PRN    Question:  Supervising Provider  Answer:  Gaynelle Arabian   11/21/16 0842   11/21/16 0000  oxyCODONE (OXY IR/ROXICODONE) 5 MG immediate release tablet  Every 4 hours PRN    Question:  Supervising Provider  Answer:  Gaynelle Arabian   11/21/16 0842   11/21/16 0000  traMADol (ULTRAM) 50 MG tablet  Every 6 hours PRN    Question:  Supervising Provider   Answer:  Gaynelle Arabian   11/21/16 0842   11/21/16 0000  Call MD / Call 911    Comments:  If you experience chest pain or shortness of breath, CALL 911 and be transported to the hospital emergency room.  If you develope a fever above 101 F, pus (white drainage) or increased drainage or redness at the wound, or calf pain, call your surgeon's office.   11/21/16 0842   11/21/16 0000  Discharge instructions    Comments:  Take Xarelto for two and a half more weeks, then discontinue Xarelto. Once the patient has completed the blood thinner regimen, then take a Baby 81 mg Aspirin daily for three more weeks.   Pick up stool softner and laxative for home use following surgery while on pain medications. Do not submerge incision under water. Please use good [place] washing techniques while changing dressing each day. May shower starting three days after surgery. Please use a clean towel to pat the incision dry following showers. Continue to use ice for pain and swelling after surgery. Do not use any lotions or creams on the incision until instructed by your surgeon.  Wear both TED hose on both legs during the day every day for three weeks, but may remove the TED hose at night at home.  Postoperative Constipation Protocol  Constipation - defined medically as fewer than three stools per week and severe constipation as less than one stool per week.  One of the most common issues patients have following surgery is constipation.  Even if you have a regular bowel pattern at home, your normal regimen is likely to be disrupted due to multiple reasons following surgery.  Combination of anesthesia, postoperative narcotics, change in appetite and fluid intake all can affect your bowels.  In order to avoid complications following surgery, here are some recommendations in order to help you during your recovery period.  Colace (docusate) - Pick up an over-the-counter form of Colace or another stool softener and take  twice a day as long as you are requiring postoperative pain medications.  Take with a full glass of water daily.  If you experience loose stools or diarrhea, hold the colace until you stool forms back up.  If your symptoms do not get better within 1 week or if they get worse, check with your doctor.  Dulcolax (bisacodyl) - Pick up over-the-counter and take as directed by the product packaging as needed to assist with the movement of your bowels.  Take with a full glass of water.  Use this product as needed if not relieved by Colace only.   MiraLax (polyethylene glycol) - Pick up over-the-counter to have on [place].  MiraLax is a solution that will increase the amount of water in your bowels to assist with bowel movements.  Take as directed and can mix with a glass of water, juice, soda, coffee, or tea.  Take if you go more than two days without a movement. Do not use MiraLax more than once per day. Call your doctor if you are still constipated or irregular after using this medication for 7 days in a row.  If you continue to have problems with postoperative constipation, please contact the office for further assistance and recommendations.  If you experience "the worst abdominal pain ever" or develop nausea or vomiting, please contact the office immediatly for further recommendations for treatment.   11/21/16 0842   11/21/16 0000  Diet - low sodium heart healthy     11/21/16 0842   11/21/16 0000  Constipation Prevention    Comments:  Drink plenty of fluids.  Prune juice may be helpful.  You may use a stool softener, such as Colace (over the counter) 100 mg twice a day.  Use MiraLax (  over the counter) for constipation as needed.   11/21/16 0842   11/21/16 0000  Increase activity slowly as tolerated     11/21/16 0842   11/21/16 0000  Patient may shower    Comments:  You may shower without a dressing once there is no drainage.  Do not wash over the wound.  If drainage remains, do not shower until drainage  stops.   11/21/16 0842   11/21/16 0000  Weight bearing as tolerated    Question Answer Comment  Laterality right   Extremity Lower      11/21/16 0842   11/21/16 0000  Driving restrictions    Comments:  No driving until released by the physician.   11/21/16 0842   11/21/16 0000  Lifting restrictions    Comments:  No lifting until released by the physician.   11/21/16 0842   11/21/16 0000  Change dressing    Comments:  You may change your dressing dressing daily with sterile 4 x 4 inch gauze dressing and paper tape.  Do not submerge the incision under water.   11/21/16 0842   11/21/16 0000  TED hose    Comments:  Use stockings (TED hose) for 3 weeks on both leg(s).  You may remove them at night for sleeping.   11/21/16 0842   11/21/16 0000  Do not sit on low chairs, stoools or toilet seats, as it may be difficult to get up from low surfaces     11/21/16 0842     Follow-up Information    Gaynelle Arabian, MD. Schedule an appointment as soon as possible for a visit on 12/03/2016.   Specialty:  Orthopedic Surgery Contact information: [address] [place] [address] [phone]           Signed: Arlee Muslim, PA-C Orthopaedic Surgery 11/21/2016, 8:43 AM

## 2016-11-21 NOTE — Discharge Instructions (Addendum)
° °Dr. Frank Aluisio °Total Joint Specialist °[facility] °[address] °[place], [place] [address] °[phone] ° °ANTERIOR APPROACH TOTAL HIP REPLACEMENT POSTOPERATIVE DIRECTIONS ° ° °Hip Rehabilitation, Guidelines Following Surgery  °The results of a hip operation are greatly improved after range of motion and muscle strengthening exercises. Follow all safety measures which are given to protect your hip. If any of these exercises cause increased pain or swelling in your joint, decrease the amount until you are comfortable again. Then slowly increase the exercises. Call your caregiver if you have problems or questions.  ° °HOME CARE INSTRUCTIONS  °Remove items at home which could result in a fall. This includes throw rugs or furniture in walking pathways.  °· ICE to the affected hip every three hours for 30 minutes at a time and then as needed for pain and swelling.  Continue to use ice on the hip for pain and swelling from surgery. You may notice swelling that will progress down to the foot and ankle.  This is normal after surgery.  Elevate the leg when you are not up walking on it.   °· Continue to use the breathing machine which will help keep your temperature down.  It is common for your temperature to cycle up and down following surgery, especially at night when you are not up moving around and exerting yourself.  The breathing machine keeps your lungs expanded and your temperature down. ° ° °DIET °You may resume your previous home diet once your are discharged from the hospital. ° °DRESSING / WOUND CARE / SHOWERING °You may shower 3 days after surgery, but keep the wounds dry during showering.  You may use an occlusive plastic wrap (Press'n Seal for example), NO SOAKING/SUBMERGING IN THE BATHTUB.  If the bandage gets wet, change with a clean dry gauze.  If the incision gets wet, pat the wound dry with a clean towel. °You may start showering once you are discharged home but do not  submerge the incision under water. Just pat the incision dry and apply a dry gauze dressing on daily. °Change the surgical dressing daily and reapply a dry dressing each time. ° °ACTIVITY °Walk with your walker as instructed. °Use walker as long as suggested by your caregivers. °Avoid periods of inactivity such as sitting longer than an hour when not asleep. This helps prevent blood clots.  °You may resume a sexual relationship in one month or when given the OK by your doctor.  °You may return to work once you are cleared by your doctor.  °Do not drive a car for 6 weeks or until released by you surgeon.  °Do not drive while taking narcotics. ° °WEIGHT BEARING °Weight bearing as tolerated with assist device (walker, cane, etc) as directed, use it as long as suggested by your surgeon or therapist, typically at least 4-6 weeks. ° °POSTOPERATIVE CONSTIPATION PROTOCOL °Constipation - defined medically as fewer than three stools per week and severe constipation as less than one stool per week. ° °One of the most common issues patients have following surgery is constipation.  Even if you have a regular bowel pattern at home, your normal regimen is likely to be disrupted due to multiple reasons following surgery.  Combination of anesthesia, postoperative narcotics, change in appetite and fluid intake all can affect your bowels.  In order to avoid complications following surgery, here are some recommendations in order to help you during your recovery period. ° °Colace (docusate) - Pick up an over-the-counter   form of Colace or another stool softener and take twice a day as long as you are requiring postoperative pain medications.  Take with a full glass of water daily.  If you experience loose stools or diarrhea, hold the colace until you stool forms back up.  If your symptoms do not get better within 1 week or if they get worse, check with your doctor. ° °Dulcolax (bisacodyl) - Pick up over-the-counter and take as directed  by the product packaging as needed to assist with the movement of your bowels.  Take with a full glass of water.  Use this product as needed if not relieved by Colace only.  ° °MiraLax (polyethylene glycol) - Pick up over-the-counter to have on [place].  MiraLax is a solution that will increase the amount of water in your bowels to assist with bowel movements.  Take as directed and can mix with a glass of water, juice, soda, coffee, or tea.  Take if you go more than two days without a movement. °Do not use MiraLax more than once per day. Call your doctor if you are still constipated or irregular after using this medication for 7 days in a row. ° °If you continue to have problems with postoperative constipation, please contact the office for further assistance and recommendations.  If you experience "the worst abdominal pain ever" or develop nausea or vomiting, please contact the office immediatly for further recommendations for treatment. ° °ITCHING ° If you experience itching with your medications, try taking only a single pain pill, or even half a pain pill at a time.  You can also use Benadryl over the counter for itching or also to help with sleep.  ° °TED HOSE STOCKINGS °Wear the elastic stockings on both legs for three weeks following surgery during the day but you may remove then at night for sleeping. ° °MEDICATIONS °See your medication summary on the “After Visit Summary” that the nursing staff will review with you prior to discharge.  You may have some home medications which will be placed on hold until you complete the course of blood thinner medication.  It is important for you to complete the blood thinner medication as prescribed by your surgeon.  Continue your approved medications as instructed at time of discharge. ° °PRECAUTIONS °If you experience chest pain or shortness of breath - call 911 immediately for transfer to the hospital emergency department.  °If you develop a fever greater that 101 F,  purulent drainage from wound, increased redness or drainage from wound, foul odor from the wound/dressing, or calf pain - CONTACT YOUR SURGEON.   °                                                °FOLLOW-UP APPOINTMENTS °Make sure you keep all of your appointments after your operation with your surgeon and caregivers. You should call the office at the above phone number and make an appointment for approximately two weeks after the date of your surgery or on the date instructed by your surgeon outlined in the "After Visit Summary". ° °RANGE OF MOTION AND STRENGTHENING EXERCISES  °These exercises are designed to help you keep full movement of your hip joint. Follow your caregiver's or physical therapist's instructions. Perform all exercises about fifteen times, three times per day or as directed. Exercise both hips, even if you   have had only one joint replacement. These exercises can be done on a training (exercise) mat, on the floor, on a table or on a bed. Use whatever works the best and is most comfortable for you. Use music or television while you are exercising so that the exercises are a pleasant break in your day. This will make your life better with the exercises acting as a break in routine you can look forward to.  °Lying on your back, slowly slide your foot toward your buttocks, raising your knee up off the floor. Then slowly slide your foot back down until your leg is straight again.  °Lying on your back spread your legs as far apart as you can without causing discomfort.  °Lying on your side, raise your upper leg and foot straight up from the floor as far as is comfortable. Slowly lower the leg and repeat.  °Lying on your back, tighten up the muscle in the front of your thigh (quadriceps muscles). You can do this by keeping your leg straight and trying to raise your heel off the floor. This helps strengthen the largest muscle supporting your knee.  °Lying on your back, tighten up the muscles of your  buttocks both with the legs straight and with the knee bent at a comfortable angle while keeping your heel on the floor.  ° °IF YOU ARE TRANSFERRED TO A SKILLED REHAB FACILITY °If the patient is transferred to a skilled rehab facility following release from the hospital, a list of the current medications will be sent to the facility for the patient to continue.  When discharged from the skilled rehab facility, please have the facility set up the patient's Home Health Physical Therapy prior to being released. Also, the skilled facility will be responsible for providing the patient with their medications at time of release from the facility to include their pain medication, the muscle relaxants, and their blood thinner medication. If the patient is still at the rehab facility at time of the two week follow up appointment, the skilled rehab facility will also need to assist the patient in arranging follow up appointment in our office and any transportation needs. ° °MAKE SURE YOU:  °Understand these instructions.  °Get help right away if you are not doing well or get worse.  ° ° °Pick up stool softner and laxative for home use following surgery while on pain medications. °Do not submerge incision under water. °Please use good [place] washing techniques while changing dressing each day. °May shower starting three days after surgery. °Please use a clean towel to pat the incision dry following showers. °Continue to use ice for pain and swelling after surgery. °Do not use any lotions or creams on the incision until instructed by your surgeon. ° °Take Xarelto for two and a half more weeks following discharge from the hospital, then discontinue Xarelto. °Once the patient has completed the blood thinner regimen, then take a Baby 81 mg Aspirin daily for three more weeks. ° ° ° ° °Information on my medicine - XARELTO® (Rivaroxaban) ° °Why was Xarelto® prescribed for you? °Xarelto® was prescribed for you to reduce the risk of blood  clots forming after orthopedic surgery. The medical term for these abnormal blood clots is venous thromboembolism (VTE). ° °What do you need to know about xarelto® ? °Take your Xarelto® ONCE DAILY at the same time every day. °You may take it either with or without food. ° °If you have difficulty swallowing the tablet whole, you may   it and mix in applesauce just prior to taking your dose.  Take Xarelto exactly as prescribed by your doctor and DO NOT stop taking Xarelto without talking to the doctor who prescribed the medication.  Stopping without other VTE prevention medication to take the place of Xarelto may increase your risk of developing a clot.  After discharge, you should have regular check-up appointments with your healthcare provider that is prescribing your Xarelto.    What do you do if you miss a dose? If you miss a dose, take it as soon as you remember on the same day then continue your regularly scheduled once daily regimen the next day. Do not take two doses of Xarelto on the same day.   Important Safety Information A possible side effect of Xarelto is bleeding. You should call your healthcare provider right away if you experience any of the following: ? Bleeding from an injury or your nose that does not stop. ? Unusual colored urine (red or dark brown) or unusual colored stools (red or black). ? Unusual bruising for unknown reasons. ? A serious fall or if you hit your head (even if there is no bleeding).  Some medicines may interact with Xarelto and might increase your risk of bleeding while on Xarelto. To help avoid this, consult your healthcare provider or pharmacist prior to using any new prescription or non-prescription medications, including herbals, vitamins, non-steroidal anti-inflammatory drugs (NSAIDs) and supplements.  This website has more information on Xarelto: [email or web address]  Please note that the patient was hypokalemic upon admission and was given K+  supplementation with improvement.  She is on a diuretic at home and probably runs chronically low on her K+ level.  Recommend that she follow up with her PCP for recheck of labs and consideration of supplementation or med changes.

## 2016-11-21 NOTE — Evaluation (Signed)
Physical Therapy Evaluation Patient Details Name: Molly Lambert MRN: [id number] DOB: [birth year] Today's Date: 11/21/2016   History of Present Illness  Pt s/p R THR and with hx of breast CA and L THR (5/18)  Clinical Impression  Pt s/p R THR and presents with decreased R LE strength/ROM and post op pain limiting functional mobility.  Pt should progress to dc home with family assist.    Follow Up Recommendations DC plan and follow up therapy as arranged by surgeon    Equipment Recommendations  None recommended by PT    Recommendations for Other Services       Precautions / Restrictions Precautions Precautions: Fall Restrictions Weight Bearing Restrictions: No      Mobility  Bed Mobility Overal bed mobility: Needs Assistance Bed Mobility: Sit to Supine       Sit to supine: Min guard   General bed mobility comments: cues for sequence and use of L LE to self assist  Transfers Overall transfer level: Needs assistance Equipment used: Rolling walker (2 wheeled) Transfers: Sit to/from Stand Sit to Stand: Min assist;Min guard         General transfer comment: cues for LE management and use of UEs to self assist  Ambulation/Gait Ambulation/Gait assistance: Min guard Ambulation Distance (Feet): 120 Feet Assistive device: Rolling walker (2 wheeled) Gait Pattern/deviations: Step-to pattern;Step-through pattern;Decreased step length - right;Decreased step length - left;Shuffle;Trunk flexed Gait velocity: decr Gait velocity interpretation: Below normal speed for age/gender General Gait Details: cues for posture, position from RW and initial sequence  Stairs            Wheelchair Mobility    Modified Rankin (Stroke Patients Only)       Balance                                             Pertinent Vitals/Pain Pain Assessment: 0-10 Pain Score: 5  Pain Location: R hip Pain Descriptors / Indicators: Aching;Sore Pain Intervention(s):  Limited activity within patient's tolerance;Premedicated before session;Monitored during session;Ice applied    Home Living Family/patient expects to be discharged to:: Private residence Living Arrangements: Spouse/significant other Available Help at Discharge: Family Type of Home: House Home Access: Stairs to enter Entrance Stairs-Rails: Right Entrance Stairs-Number of Steps: 3 Home Layout: One level Home Equipment: Environmental consultant - 2 wheels;Cane - single point Additional Comments: husband can help a little; he has dementia.  will have 24/7    Prior Function Level of Independence: Independent               [place] Dominance        Extremity/Trunk Assessment   Upper Extremity Assessment Upper Extremity Assessment: Overall WFL for tasks assessed    Lower Extremity Assessment Lower Extremity Assessment: RLE deficits/detail RLE Deficits / Details: Strength at hip 2+/5 with AAROM at hip to 85 flex and 15 abd       Communication   Communication: No difficulties  Cognition Arousal/Alertness: Awake/alert Behavior During Therapy: WFL for tasks assessed/performed Overall Cognitive Status: Within Functional Limits for tasks assessed                                        General Comments      Exercises Total Joint Exercises Ankle Circles/Pumps: AROM;Both;15 reps;Supine Quad Sets:  AROM;Both;10 reps;Supine Heel Slides: AAROM;Right;20 reps;Supine Hip ABduction/ADduction: AAROM;Right;15 reps;Supine   Assessment/Plan    PT Assessment Patient needs continued PT services  PT Problem List Decreased strength;Decreased range of motion;Decreased activity tolerance;Decreased mobility;Decreased knowledge of use of DME;Pain       PT Treatment Interventions DME instruction;Gait training;Stair training;Functional mobility training;Therapeutic activities;Therapeutic exercise;Patient/family education    PT Goals (Current goals can be found in the Care Plan section)  Acute  Rehab PT Goals Patient Stated Goal: Regain IND and resume previous lifestyle PT Goal Formulation: With patient Time For Goal Achievement: 11/23/16 Potential to Achieve Goals: Good    Frequency 7X/week   Barriers to discharge        Co-evaluation               AM-PAC PT "6 Clicks" Daily Activity  Outcome Measure Difficulty turning over in bed (including adjusting bedclothes, sheets and blankets)?: A Lot Difficulty moving from lying on back to sitting on the side of the bed? : A Lot Difficulty sitting down on and standing up from a chair with arms (e.g., wheelchair, bedside commode, etc,.)?: A Little Help needed moving to and from a bed to chair (including a wheelchair)?: A Little Help needed walking in hospital room?: A Little Help needed climbing 3-5 steps with a railing? : A Little 6 Click Score: 16    End of Session Equipment Utilized During Treatment: Gait belt Activity Tolerance: Patient tolerated treatment well Patient left: in bed;with call bell/phone within reach;with bed alarm set Nurse Communication: Mobility status PT Visit Diagnosis: Difficulty in walking, not elsewhere classified (R26.2)    Time: 7591-6384 PT Time Calculation (min) (ACUTE ONLY): 28 min   Charges:   PT Evaluation $PT Eval Low Complexity: 1 Low PT Treatments $Therapeutic Exercise: 8-22 mins   PT G Codes:        Pg [phone]   Caili Escalera 11/21/2016, 12:32 PM

## 2016-11-21 NOTE — Progress Notes (Signed)
Subjective: 1 Day Post-Op Procedure(s) (LRB): RIGHT TOTAL HIP ARTHROPLASTY ANTERIOR APPROACH (Right) Patient reports pain as mild.   Patient seen in rounds with Dr. Wynelle Lambert. Patient is well, but has had some minor complaints of pain in the hip, requiring pain medications We will start therapy today.  If they do well with therapy and meets all goals, then will allow home later this afternoon following therapy. Plan is to go Home after hospital stay.  Objective: Vital signs in last 24 hours: Temp:  [96.7 F (35.9 C)-98.8 F (37.1 C)] 97.8 F (36.6 C) (09/27 0601) Pulse Rate:  [53-85] 67 (09/27 0601) Resp:  [11-20] 17 (09/27 0601) BP: (103-172)/(42-95) 131/91 (09/27 0601) SpO2:  [93 %-100 %] 98 % (09/27 0601) Weight:  [69.4 kg (153 lb)] 69.4 kg (153 lb) (09/26 1730)  Intake/Output from previous day:  Intake/Output Summary (Last 24 hours) at 11/21/16 0837 Last data filed at 11/21/16 0602  Gross per 24 hour  Intake          4911.58 ml  Output             1930 ml  Net          2981.58 ml    Intake/Output this shift: No intake/output data recorded.  Labs:  Recent Labs  11/21/16 0613  HGB 10.8*    Recent Labs  11/21/16 0613  WBC 12.0*  RBC 3.62*  HCT 31.6*  PLT 277    Recent Labs  11/20/16 1957 11/21/16 0613  NA 137 138  K 2.9* 3.2*  CL 99* 102  CO2 26 27  BUN 12 11  CREATININE 0.86 0.67  GLUCOSE 218* 135*  CALCIUM 9.2 8.9   No results for input(s): LABPT, INR in the last 72 hours.  EXAM General - Patient is Alert, Appropriate and Oriented Extremity - Neurovascular intact Sensation intact distally Intact pulses distally Dorsiflexion/Plantar flexion intact Dressing - dressing C/D/I Motor Function - intact, moving foot and toes well on exam.  Hemovac pulled without difficulty.  Past Medical History:  Diagnosis Date  . Arthritis   . Breast cancer ([facility]) 11/2010   Rt breast  . Cataract   . Erythema    reddened area of epidermis spnning  concentrated around sinuses on the face; pt reports cracking, itchiness, and flaking  of affected skin; seen derm no absolute dx, will get 2nd opinion   . Hypercholesteremia   . Hypertension    NO CARDIAC MD,  [facility]  PCP  ,  URGENT MED  . Hypothyroidism   . Leg laceration    WITH SURGERY  . Paget's carcinoma of the nipple, right ([facility])    nipple removed  . S/P radiation therapy 03/14/11 - 04/05/11   Right Breast / 4256 cGy in 16 Fractions  . Thyroid disease     Assessment/Plan: 1 Day Post-Op Procedure(s) (LRB): RIGHT TOTAL HIP ARTHROPLASTY ANTERIOR APPROACH (Right) Active Problems:   OA (osteoarthritis) of hip  Estimated body mass index is 26.26 kg/m as calculated from the following:   Height as of this encounter: 5\' 4"  (1.626 m).   Weight as of this encounter: 69.4 kg (153 lb). Advance diet Up with therapy Discharge home - HEP - Exercise Sheet Provided  DVT Prophylaxis - Xarelto Weight Bearing As Tolerated right Leg Hemovac Pulled Begin Therapy  If meets goals and able to go home: Up with therapy Diet - Cardiac diet Follow up - in 2 weeks Activity - WBAT Disposition - Home Condition Upon Discharge -  Stable D/C Meds - See DC Summary DVT Prophylaxis - Xarelto  Arlee Muslim, PA-C Orthopaedic Surgery 11/21/2016, 8:37 AM

## 2016-11-21 NOTE — Progress Notes (Signed)
Physical Therapy Treatment Patient Details Name: Molly Lambert MRN: [id number] DOB: 12/06/1934 Today's Date: 11/21/2016    History of Present Illness Pt s/p R THR and with hx of breast CA and L THR (5/18)    PT Comments    Pt progressing well with mobility and eager for dc home.  Reviewed therex, stairs and car transfers.   Follow Up Recommendations  DC plan and follow up therapy as arranged by surgeon     Equipment Recommendations  None recommended by PT    Recommendations for Other Services       Precautions / Restrictions Precautions Precautions: Fall Restrictions Weight Bearing Restrictions: No    Mobility  Bed Mobility Overal bed mobility: Needs Assistance Bed Mobility: Supine to Sit     Supine to sit: Min guard     General bed mobility comments: cues for sequence and use of L LE to self assist  Transfers Overall transfer level: Needs assistance Equipment used: Rolling walker (2 wheeled) Transfers: Sit to/from Stand Sit to Stand: Min guard;Supervision         General transfer comment: cues for LE management and use of UEs to self assist  Ambulation/Gait Ambulation/Gait assistance: Min guard;Supervision Ambulation Distance (Feet): 160 Feet Assistive device: Rolling walker (2 wheeled) Gait Pattern/deviations: Step-to pattern;Step-through pattern;Decreased step length - right;Decreased step length - left;Shuffle;Trunk flexed Gait velocity: decr Gait velocity interpretation: Below normal speed for age/gender General Gait Details: cues for posture, position from RW and initial sequence   Stairs Stairs: Yes   Stair Management: One rail Left;Forwards;With cane;Step to pattern Number of Stairs: 5 General stair comments: cues for sequence and foot/cane placement  Wheelchair Mobility    Modified Rankin (Stroke Patients Only)       Balance                                            Cognition Arousal/Alertness:  Awake/alert Behavior During Therapy: WFL for tasks assessed/performed Overall Cognitive Status: Within Functional Limits for tasks assessed                                        Exercises Total Joint Exercises Ankle Circles/Pumps: AROM;Both;15 reps;Supine Quad Sets: AROM;Both;10 reps;Supine Heel Slides: AAROM;Right;20 reps;Supine Hip ABduction/ADduction: AAROM;Right;15 reps;Supine    General Comments        Pertinent Vitals/Pain Pain Assessment: 0-10 Pain Score: 4  Pain Location: R hip Pain Descriptors / Indicators: Aching;Sore Pain Intervention(s): Limited activity within patient's tolerance;Monitored during session;Premedicated before session;Ice applied    Home Living                      Prior Function            PT Goals (current goals can now be found in the care plan section) Acute Rehab PT Goals Patient Stated Goal: Regain IND and resume previous lifestyle PT Goal Formulation: With patient Time For Goal Achievement: 11/23/16 Potential to Achieve Goals: Good Progress towards PT goals: Progressing toward goals    Frequency    7X/week      PT Plan Current plan remains appropriate    Co-evaluation              AM-PAC PT "6 Clicks" Daily Activity  Outcome Measure  Difficulty  turning over in bed (including adjusting bedclothes, sheets and blankets)?: A Lot Difficulty moving from lying on back to sitting on the side of the bed? : A Little Difficulty sitting down on and standing up from a chair with arms (e.g., wheelchair, bedside commode, etc,.)?: A Little Help needed moving to and from a bed to chair (including a wheelchair)?: A Little Help needed walking in hospital room?: A Little Help needed climbing 3-5 steps with a railing? : A Little 6 Click Score: 17    End of Session Equipment Utilized During Treatment: Gait belt Activity Tolerance: Patient tolerated treatment well Patient left: with call bell/phone within  reach;in chair Nurse Communication: Mobility status PT Visit Diagnosis: Difficulty in walking, not elsewhere classified (R26.2)     Time: 1350-1415 PT Time Calculation (min) (ACUTE ONLY): 25 min  Charges:  $Gait Training: 8-22 mins $Therapeutic Exercise: 8-22 mins                    G Codes:       Pg [phone]    Rickardo Brinegar 11/21/2016, 5:25 PM

## 2016-12-20 ENCOUNTER — Ambulatory Visit: Payer: Medicare Other | Admitting: Family Medicine

## 2017-02-23 ENCOUNTER — Other Ambulatory Visit: Payer: Self-pay | Admitting: Family Medicine

## 2017-02-23 DIAGNOSIS — I10 Essential (primary) hypertension: Secondary | ICD-10-CM

## 2017-03-12 ENCOUNTER — Other Ambulatory Visit: Payer: Self-pay | Admitting: Oncology

## 2017-03-12 DIAGNOSIS — Z9889 Other specified postprocedural states: Secondary | ICD-10-CM

## 2017-04-16 ENCOUNTER — Ambulatory Visit
Admission: RE | Admit: 2017-04-16 | Discharge: 2017-04-16 | Disposition: A | Discharge status: Home / Self Care | Payer: Medicare Other | Source: Ambulatory Visit | Attending: Oncology | Admitting: Oncology

## 2017-04-16 DIAGNOSIS — Z9889 Other specified postprocedural states: Secondary | ICD-10-CM

## 2017-04-16 HISTORY — DX: Personal history of irradiation: Z92.3

## 2017-04-22 ENCOUNTER — Other Ambulatory Visit: Payer: Self-pay | Admitting: Family Medicine

## 2017-04-22 DIAGNOSIS — I10 Essential (primary) hypertension: Secondary | ICD-10-CM

## 2017-04-24 ENCOUNTER — Other Ambulatory Visit: Payer: Self-pay

## 2017-04-24 ENCOUNTER — Ambulatory Visit (INDEPENDENT_AMBULATORY_CARE_PROVIDER_SITE_OTHER): Payer: Medicare Other | Admitting: Family Medicine

## 2017-04-24 ENCOUNTER — Ambulatory Visit: Payer: Medicare Other | Admitting: Family Medicine

## 2017-04-24 ENCOUNTER — Encounter: Payer: Self-pay | Admitting: Family Medicine

## 2017-04-24 VITALS — BP 138/70 | HR 76 | Temp 99.0°F | Resp 16 | Ht 63.98 in | Wt 149.0 lb

## 2017-04-24 DIAGNOSIS — M25511 Pain in right shoulder: Secondary | ICD-10-CM | POA: Diagnosis not present

## 2017-04-24 DIAGNOSIS — I10 Essential (primary) hypertension: Secondary | ICD-10-CM

## 2017-04-24 DIAGNOSIS — M7989 Other specified soft tissue disorders: Secondary | ICD-10-CM

## 2017-04-24 DIAGNOSIS — E7800 Pure hypercholesterolemia, unspecified: Secondary | ICD-10-CM | POA: Diagnosis not present

## 2017-04-24 DIAGNOSIS — E039 Hypothyroidism, unspecified: Secondary | ICD-10-CM | POA: Diagnosis not present

## 2017-04-24 MED ORDER — AMLODIPINE BESYLATE 5 MG PO TABS: 5.0000 mg | ORAL_TABLET | Freq: Every day | ORAL | 1 refills | Status: DC

## 2017-04-24 MED ORDER — SIMVASTATIN 20 MG PO TABS: 20.0000 mg | ORAL_TABLET | Freq: Every day | ORAL | 1 refills | Status: DC

## 2017-04-24 MED ORDER — TRAMADOL HCL 50 MG PO TABS: 50.0000 mg | ORAL_TABLET | Freq: Four times a day (QID) | ORAL | 0 refills | Status: DC | PRN

## 2017-04-24 MED ORDER — CHLORTHALIDONE 25 MG PO TABS: 25.0000 mg | ORAL_TABLET | Freq: Every day | ORAL | 1 refills | Status: DC

## 2017-04-24 MED ORDER — LEVOTHYROXINE SODIUM 50 MCG PO TABS: 50.0000 ug | ORAL_TABLET | Freq: Every day | ORAL | 3 refills | Status: DC

## 2017-04-24 NOTE — Patient Instructions (Addendum)
I will check a blood clot test, and if that is elevated will need to check an ultrasound of your leg. Ok to continue elevating  I will check some labs today, but if cholesterol is elevated will need to recheck that level after fasting for 8 hours.   Please follow up to discuss your right shoulder pain with orthopedics as a suspect some of that pain may be due to arthritis. I'm also happy to see you if you're not able to be seen by orthopedics. For now Tylenol is best, but ok to take tramadol temporarily for pain if needed.   No change in thyroid or blood pressure medications for now.  Thank you for coming in today.  Return to the clinic or go to the nearest emergency room if any of your symptoms worsen or new symptoms occur.      IF you received an x-ray today, you will receive an invoice from [facility]. Please contact [facility] at [phone] with questions or concerns regarding your invoice.   IF you received labwork today, you will receive an invoice from [facility]. Please contact [facility] at [phone] with questions or concerns regarding your invoice.   Our billing staff will not be able to assist you with questions regarding bills from these companies.  You will be contacted with the lab results as soon as they are available. The fastest way to get your results is to activate your My Chart account. Instructions are located on the last page of this paperwork. If you have not heard from [place] regarding the results in 2 weeks, please contact this office.

## 2017-04-24 NOTE — Progress Notes (Signed)
Subjective:  By signing my name below, I, Theresia Bough, attest that this documentation has been prepared under the direction and in the presence of Carlota Raspberry Ranell Patrick, MD.  Electronically Signed: Theresia Bough, Medical Scribe 04/24/17 at 11:28 AM   Patient ID: Molly Lambert, female    DOB: 1934/12/17, 83 y.o.   MRN: [id number] Chief Complaint  Patient presents with  . Chronic Conditions    6 month follow-up, pt states she has been having swelling in the left ankle x 2 weeks.     HPI Molly Lambert is a 83 y.o. female who presents to Primary Care at [facility] for follow up.   HTN She takes Chlorthalidone 25 mg QD and Norvasc 5 mg QD. She has not been checking her BP at home. She denies CP, SOB or dizziness.  Lab Results  Component Value Date   CREATININE 0.67 11/21/2016   Of note she did have Hypokalemia on last BMP in Sept 2018 after a right Total Hip Replacement in the hospital.   Hypothyroidism  She takes Synthroid 50 mcg QD.  Lab Results  Component Value Date   TSH 0.617 10/24/2016   Today, she denies changes in hot/cold sensation, hair loss, skin changes, weight Lambert.   HLD She takes Zocor 20 mg QD. She notes she is not fasting today.  Lab Results  Component Value Date   CHOL 231 (H) 10/24/2016   HDL 62 10/24/2016   LDLCALC 135 (H) 10/24/2016   LDLDIRECT 131 (H) 08/22/2011   TRIG 171 (H) 10/24/2016   CHOLHDL 3.7 10/24/2016   Lab Results  Component Value Date   ALT 17 11/20/2016   AST 34 11/20/2016   ALKPHOS 94 11/20/2016   BILITOT 0.5 11/20/2016   Left ankle swelling She reports she has been having left ankle swelling onset 2 weeks ago. She states the swelling is localized to the foot and the ankle. She notes that her symptoms have been gradually improving with leg elevation. No hx of injury. She denies leg pain or any other complaints at this time.   She has no hx of DVT. She is s/p right hip replacement 5 months ago and left hip replacement 9 months ago. She  was placed on Xarelto 9 months ago but was discontinued in 10/2016 due to hematuria. After her second total hip replacement in 10/2016 she was started on ASA and took it for 3 weeks.   Right Shoulder pain She endorses pain in her right shoulder for the past few months. She has not seen an orthopedist. She has been taking Tramadol for relief of her pain and is requesting a refill. No hx of fall or injury.   Patient Active Problem List   Diagnosis Date Noted  . OA (osteoarthritis) of hip 07/17/2016  . Breast microcalcifications 12/27/2011  . History of breast cancer 12/27/2011  . Hypothyroidism 08/25/2011  . Hypercholesteremia 08/25/2011  . HTN (hypertension) 08/25/2011  . Malignant neoplasm of central portion of right breast in female, estrogen receptor positive ([place]) 02/27/2011  . Breast mass, right 11/28/2010   Past Medical History:  Diagnosis Date  . Arthritis   . Breast cancer ([place]) 11/2010   Rt breast  . Cataract   . Erythema    reddened area of epidermis spnning concentrated around sinuses on the face; pt reports cracking, itchiness, and flaking  of affected skin; seen derm no absolute dx, will get 2nd opinion   . Hypercholesteremia   . Hypertension    NO  CARDIAC MD,  [facility]  PCP  ,  URGENT MED  . Hypothyroidism   . Leg laceration    WITH SURGERY  . Paget's carcinoma of the nipple, right ([facility])    nipple removed  . Personal history of radiation therapy   . S/P radiation therapy 03/14/11 - 04/05/11   Right Breast / 4256 cGy in 16 Fractions  . Thyroid disease    Past Surgical History:  Procedure Laterality Date  . BREAST BIOPSY    . BREAST LUMPECTOMY  01/22/11   RIGHT BREAST LUMPECTOMY WITH BIOSPY OF 1 SENTINEL NODE, INVASIVE GRADE II DUCTAL CARCINOMA , INTERMEDIATE GRADE DUCTAL CARCINOMA IN SITU , DERMAL SKIN INVOLVED, MARGINS NEGATIVE,( 0/1)  NODE POSITIVE, ER+, PR+, LOW s-PHASE, HER 2 NEU- NO AMPLIFICATION  . BREAST SURGERY  11/28/10   SKIN-PUNCH BIOSPY RIGHT  BREAST(NIPPLE), ER+, RP+, LOW S-PHASE, HER 2NEU NEGAITIVE  . EYE SURGERY      BILATERAL CATARACT SURGERY  . JOINT REPLACEMENT     left hip  . PARTIAL MASTECTOMY WITH NEEDLE LOCALIZATION  01/10/2012   Procedure: PARTIAL MASTECTOMY WITH NEEDLE LOCALIZATION;  Surgeon: Marcello Moores A. Cornett, MD;  Location: [facility];  Service: General;  Laterality: Right;  right breast needle localized partial mastectomy and removal of nipple   . TOTAL HIP ARTHROPLASTY Left 07/17/2016   Procedure: LEFT TOTAL HIP ARTHROPLASTY ANTERIOR APPROACH;  Surgeon: Gaynelle Arabian, MD;  Location: [facility];  Service: Orthopedics;  Laterality: Left;  . TOTAL HIP ARTHROPLASTY Right 11/20/2016   Procedure: RIGHT TOTAL HIP ARTHROPLASTY ANTERIOR APPROACH;  Surgeon: Gaynelle Arabian, MD;  Location: [facility];  Service: Orthopedics;  Laterality: Right;  . TUBAL LIGATION  1972   Allergies  Allergen Reactions  . Xarelto [Rivaroxaban]    Prior to Admission medications   Medication Sig Start Date End Date Taking? Authorizing Provider  acetaminophen (TYLENOL) 500 MG tablet Take 1,000 mg by mouth every 8 (eight) hours as needed for mild pain or headache.   Yes [provider]  amLODipine (NORVASC) 5 MG tablet TAKE 1 TABLET BY MOUTH ONCE DAILY 04/23/17  Yes Wendie Agreste, MD  chlorthalidone (HYGROTON) 25 MG tablet Take 1 tablet (25 mg total) by mouth daily. 10/24/16  Yes Wendie Agreste, MD  levothyroxine (SYNTHROID, LEVOTHROID) 50 MCG tablet Take 1 tablet (50 mcg total) by mouth daily before breakfast. 10/24/16  Yes Wendie Agreste, MD  methocarbamol (ROBAXIN) 500 MG tablet Take 1 tablet (500 mg total) by mouth every 6 (six) hours as needed for muscle spasms. 11/21/16  Yes Perkins, Alexzandrew L, PA-C  oxyCODONE (OXY IR/ROXICODONE) 5 MG immediate release tablet Take 1-2 tablets (5-10 mg total) by mouth every 4 (four) hours as needed for moderate pain or severe pain. 11/21/16  Yes Perkins, Alexzandrew L, PA-C  simvastatin  (ZOCOR) 20 MG tablet Take 1 tablet (20 mg total) by mouth daily. 10/24/16  Yes Wendie Agreste, MD  traMADol (ULTRAM) 50 MG tablet Take 1-2 tablets (50-100 mg total) by mouth every 6 (six) hours as needed for moderate pain. 11/21/16  Yes Perkins, Alexzandrew L, PA-C   Social History   Socioeconomic History  . Marital status: Married    Spouse name: Not on file  . Number of children: 3  . Years of education: 12  . Highest education level: Not on file  Social Needs  . Financial resource strain: Not on file  . Food insecurity - worry: Not on file  . Food insecurity - inability: Not on  file  . Transportation needs - medical: Not on file  . Transportation needs - non-medical: Not on file  Occupational History  . Occupation: Waitress    Comment: Secretary/administrator  Tobacco Use  . Smoking status: Never Smoker  . Smokeless tobacco: Never Used  Substance and Sexual Activity  . Alcohol use: No    Alcohol/week: 0.0 oz    Comment: Rarely/On Special Occasions  . Drug use: No  . Sexual activity: Not on file    Comment: G3, P3, MENARCHE, AGE 17, MENOPAUSE AGE 60, NO BC AND HRT X 10 YEARS  Other Topics Concern  . Not on file  Social History Narrative   Married for 56 years      3 children, one deceased   57 grand-children, 3 great-grandchildren   Review of Systems  Constitutional: Negative for unexpected weight change.  Respiratory: Negative for shortness of breath.   Cardiovascular: Positive for leg swelling (left ankle, improving ). Negative for chest pain.  Musculoskeletal:       (-) leg pain  Neurological: Negative for dizziness.       Objective:   Physical Exam  Constitutional: She is oriented to person, place, and time. She appears well-developed and well-nourished. No distress.  HENT:  Head: Normocephalic and atraumatic.  Eyes: Conjunctivae are normal. Pupils are equal, round, and reactive to light. No scleral icterus.  Neck: Neck supple.  Cardiovascular: Normal  rate.  Edema of left lower leg at the ankle slightly into the left foot. DP pulse intact. Cap refill less than 1 second. Calf is non tender. Negative Homan's  Calf circumference 15 cm below the patella Right: 33 cm Left: 33 cm   Pulmonary/Chest: Effort normal. No respiratory distress.  Musculoskeletal: Normal range of motion.  Equal ROM bilateral shoulders. Equal rotator cuff strength bilaterally.   Neurological: She is alert and oriented to person, place, and time.  Skin: She is not diaphoretic.    Vitals:   04/24/17 1022  BP: 138/70  Pulse: 76  Resp: 16  Temp: 99 F (37.2 C)  TempSrc: Oral  SpO2: 96%  Weight: 149 lb (67.6 kg)  Height: 5' 3.98" (1.625 m)      Assessment & Plan:   Molly Lambert is a 83 y.o. female Left leg swelling - Plan: D-dimer, quantitative (not at [facility])  - Unilateral, no calf pain, appears to be more ankle swelling and improving.. Borderline elevated d-dimer, will still order ultrasound but at that level and her age unlikely true positive.  Hypercholesteremia - Plan: simvastatin (ZOCOR) 20 MG tablet, Lipid panel  -Tolerating Zocor, lipid panel pending. No changes for now  Hypothyroidism, unspecified type - Plan: TSH, levothyroxine (SYNTHROID, LEVOTHROID) 50 MCG tablet  -Check TSH, continue same dose Synthroid  Essential hypertension - Plan: chlorthalidone (HYGROTON) 25 MG tablet, amLODipine (NORVASC) 5 MG tablet, Comprehensive metabolic panel  -Stable, tolerating chlorthalidone and amlodipine at current dose. Continue same, labs pending  Pain in joint of right shoulder - Plan: traMADol (ULTRAM) 50 MG tablet  -Likely degenerative changes, agreed to refill tramadol temporarily until she can have further follow-up for possible imaging/further evaluation of shoulder issue given time discussing other issues in office at present  Meds ordered this encounter  Medications  . simvastatin (ZOCOR) 20 MG tablet    Sig: Take 1 tablet (20 mg total) by mouth  daily.    Dispense:  90 tablet    Refill:  1  . levothyroxine (SYNTHROID, LEVOTHROID) 50 MCG tablet  Sig: Take 1 tablet (50 mcg total) by mouth daily before breakfast.    Dispense:  90 tablet    Refill:  3  . chlorthalidone (HYGROTON) 25 MG tablet    Sig: Take 1 tablet (25 mg total) by mouth daily.    Dispense:  90 tablet    Refill:  1  . amLODipine (NORVASC) 5 MG tablet    Sig: Take 1 tablet (5 mg total) by mouth daily.    Dispense:  90 tablet    Refill:  1  . traMADol (ULTRAM) 50 MG tablet    Sig: Take 1 tablet (50 mg total) by mouth every 6 (six) hours as needed for moderate pain.    Dispense:  20 tablet    Refill:  0   Patient Instructions   I will check a blood clot test, and if that is elevated will need to check an ultrasound of your leg. Ok to continue elevating  I will check some labs today, but if cholesterol is elevated will need to recheck that level after fasting for 8 hours.   Please follow up to discuss your right shoulder pain with orthopedics as a suspect some of that pain may be due to arthritis. I'm also happy to see you if you're not able to be seen by orthopedics. For now Tylenol is best, but ok to take tramadol temporarily for pain if needed.   No change in thyroid or blood pressure medications for now.  Thank you for coming in today.  Return to the clinic or go to the nearest emergency room if any of your symptoms worsen or new symptoms occur.      IF you received an x-ray today, you will receive an invoice from [facility]. Please contact [facility] at [phone] with questions or concerns regarding your invoice.   IF you received labwork today, you will receive an invoice from [place]. Please contact [facility] at [phone] with questions or concerns regarding your invoice.   Our billing staff will not be able to assist you with questions regarding bills from these companies.  You will be contacted with the lab results as  soon as they are available. The fastest way to get your results is to activate your My Chart account. Instructions are located on the last page of this paperwork. If you have not heard from [place] regarding the results in 2 weeks, please contact this office.       I personally performed the services described in this documentation, which was scribed in my presence. The recorded information has been reviewed and considered for accuracy and completeness, addended by me as needed, and agree with information above.  Signed,   [name], MD [facility] at [place].  04/26/17 11:19 AM

## 2017-04-25 LAB — LIPID PANEL
CHOLESTEROL TOTAL: 202 mg/dL — AB (ref 100–199)
Chol/HDL Ratio: 2.8 ratio (ref 0.0–4.4)
HDL: 72 mg/dL (ref >39)
LDL CALC: 96 mg/dL (ref 0–99)
TRIGLYCERIDES: 172 mg/dL — AB (ref 0–149)
VLDL Cholesterol Cal: 34 mg/dL (ref 5–40)

## 2017-04-25 LAB — COMPREHENSIVE METABOLIC PANEL
ALK PHOS: 109 IU/L (ref 39–117)
ALT: 15 IU/L (ref 0–32)
AST: 17 IU/L (ref 0–40)
Albumin/Globulin Ratio: 1.4 (ref 1.2–2.2)
Albumin: 3.8 g/dL (ref 3.5–4.7)
BUN/Creatinine Ratio: 23 (ref 12–28)
BUN: 18 mg/dL (ref 8–27)
Bilirubin Total: 0.4 mg/dL (ref 0.0–1.2)
CO2: 24 mmol/L (ref 20–29)
CREATININE: 0.78 mg/dL (ref 0.57–1.00)
Calcium: 9.5 mg/dL (ref 8.7–10.3)
Chloride: 103 mmol/L (ref 96–106)
GFR calc Af Amer: 82 mL/min/{1.73_m2} (ref >59)
GFR calc non Af Amer: 72 mL/min/{1.73_m2} (ref >59)
GLOBULIN, TOTAL: 2.7 g/dL (ref 1.5–4.5)
Glucose: 88 mg/dL (ref 65–99)
Potassium: 4.3 mmol/L (ref 3.5–5.2)
SODIUM: 140 mmol/L (ref 134–144)
Total Protein: 6.5 g/dL (ref 6.0–8.5)

## 2017-04-25 LAB — TSH: TSH: 1.07 u[IU]/mL (ref 0.450–4.500)

## 2017-04-25 LAB — D-DIMER, QUANTITATIVE: D-DIMER: 0.51 mg/L FEU — ABNORMAL HIGH (ref 0.00–0.49)

## 2017-04-26 ENCOUNTER — Other Ambulatory Visit: Payer: Self-pay | Admitting: Family Medicine

## 2017-04-26 ENCOUNTER — Encounter: Payer: Self-pay | Admitting: Family Medicine

## 2017-04-26 DIAGNOSIS — M7989 Other specified soft tissue disorders: Secondary | ICD-10-CM

## 2017-04-26 DIAGNOSIS — R7989 Other specified abnormal findings of blood chemistry: Secondary | ICD-10-CM

## 2017-04-29 ENCOUNTER — Telehealth: Payer: Self-pay | Admitting: Family Medicine

## 2017-04-29 NOTE — Telephone Encounter (Signed)
Notes recorded by Wendie Agreste, MD on 04/27/2017 at 5:09 PM EST Baby ASA probably would be ok, but if still having swelling, would proceed on with the ultrasound as ordered  Pt  Notified  Of  Above   Notes   From Dr  Carlota Raspberry  Pt states  The  Swelling is  Much better in fact  She  Says  It is  Not  Swollen  At all  Now   Pt  verbalizes  Knowledge  Of Baby ASA

## 2017-04-30 NOTE — Progress Notes (Signed)
This encounter was created in error - please disregard.

## 2017-06-25 IMAGING — DX DG CHEST 2V
2 series · 2 of 2 positions shown · non-contrast
Comparison: 01/15/2011

CLINICAL DATA: Preop hip replacement

EXAM:
CHEST  2 VIEW

[chest pa]
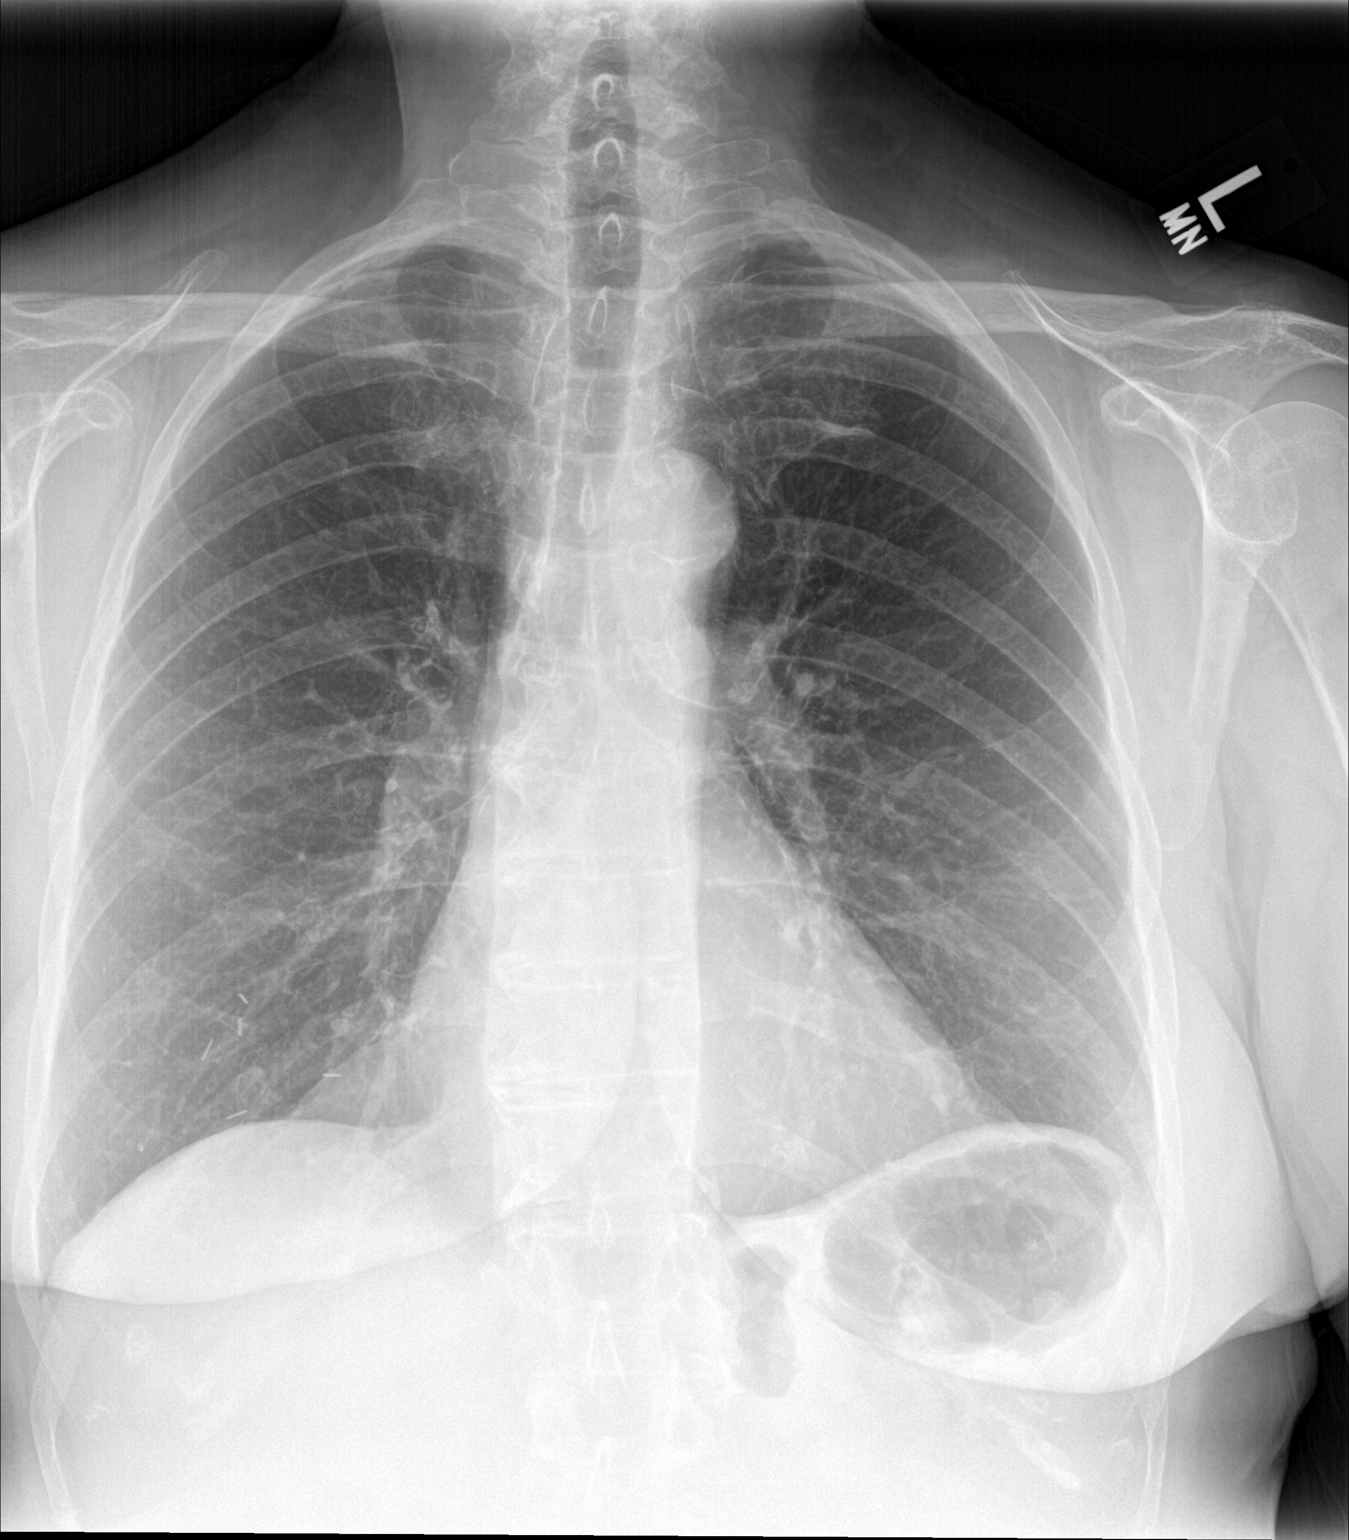

[chest lat]
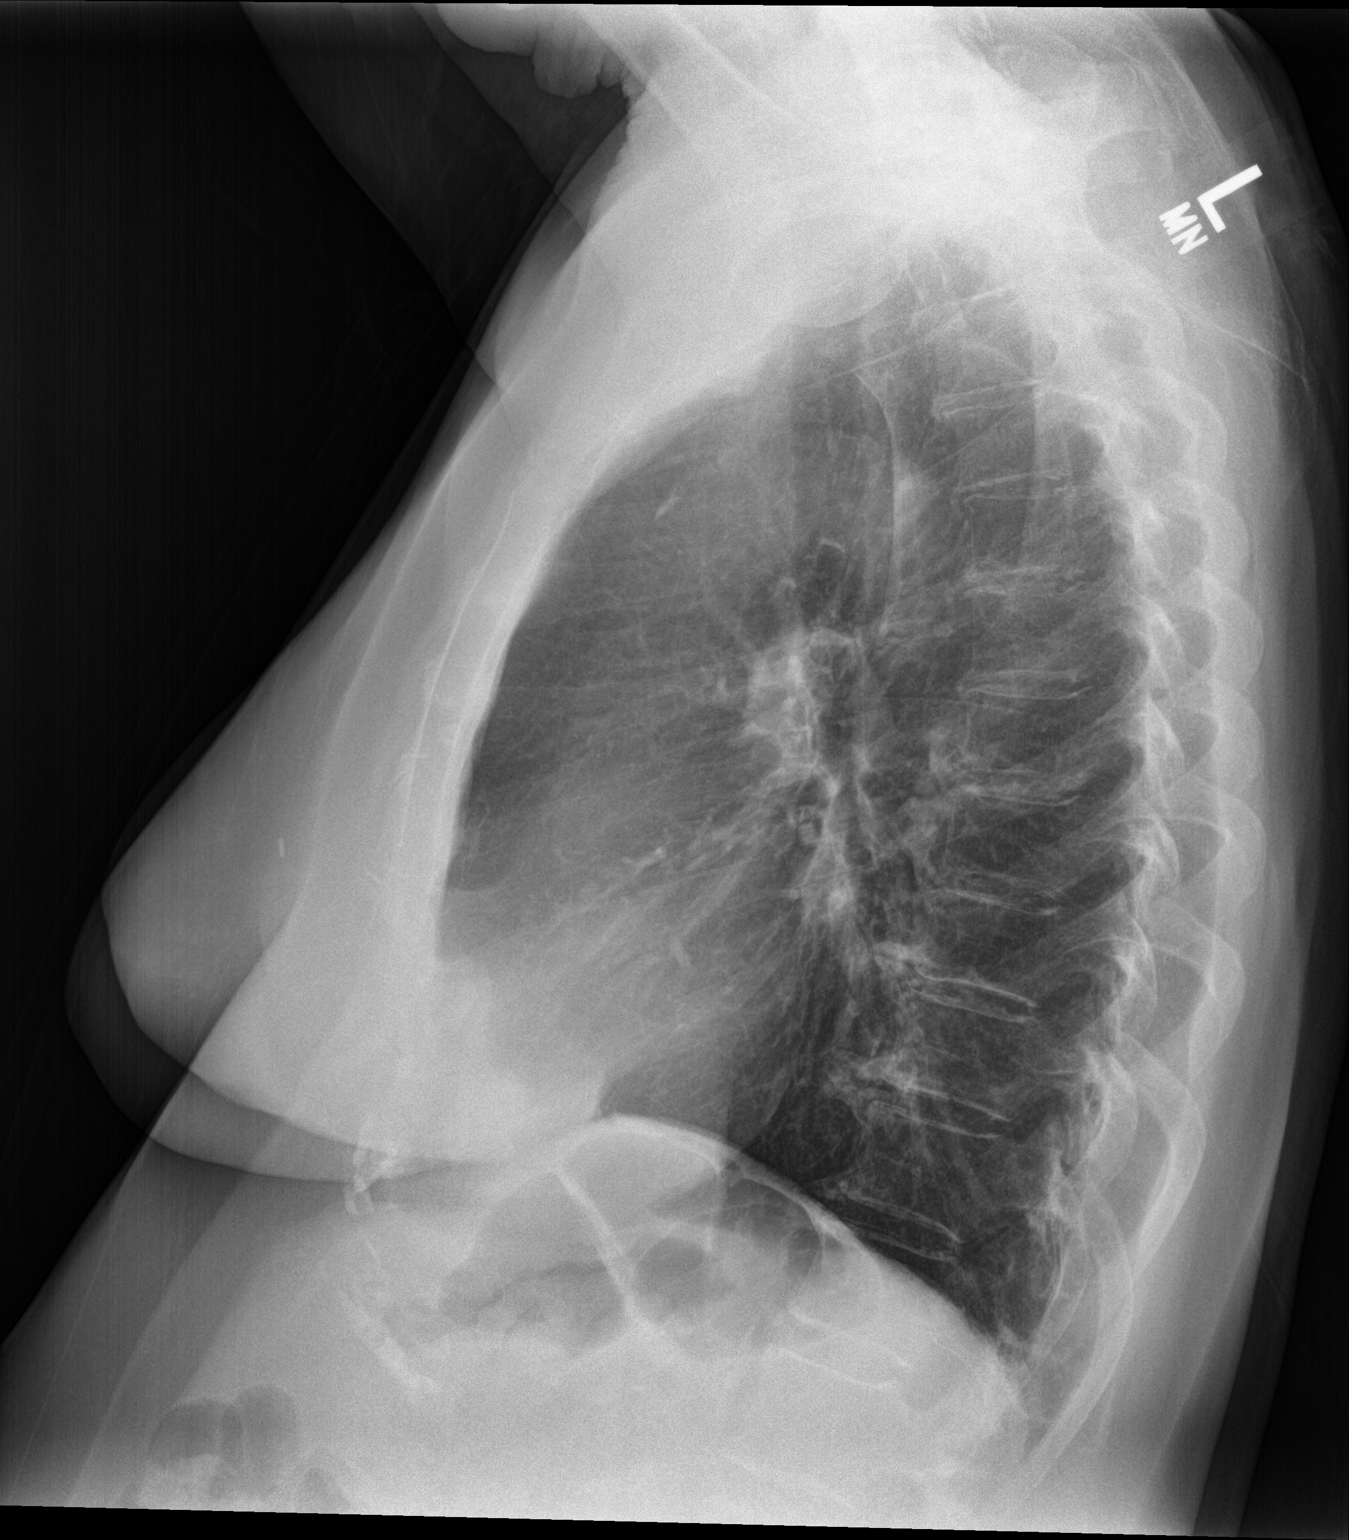

[2 of 2 positions shown; findings below may reference images not displayed]

FINDINGS: There is no focal parenchymal opacity. There is no pleural effusion
or pneumothorax. The heart and mediastinal contours are
unremarkable.

There are multiple surgical clips in the right breast from partial
mastectomy.

The osseous structures are unremarkable.
IMPRESSION: No active cardiopulmonary disease.

## 2017-07-18 ENCOUNTER — Other Ambulatory Visit: Payer: Self-pay | Admitting: Family Medicine

## 2017-07-18 DIAGNOSIS — I10 Essential (primary) hypertension: Secondary | ICD-10-CM

## 2017-08-17 IMAGING — DX DG PORTABLE PELVIS
2 series · 2 of 2 positions shown · non-contrast
Comparison: None.

CLINICAL DATA: Left hip replacement.

EXAM:
PORTABLE PELVIS 1-2 VIEWS

[pelvis ap (1 of 2)]
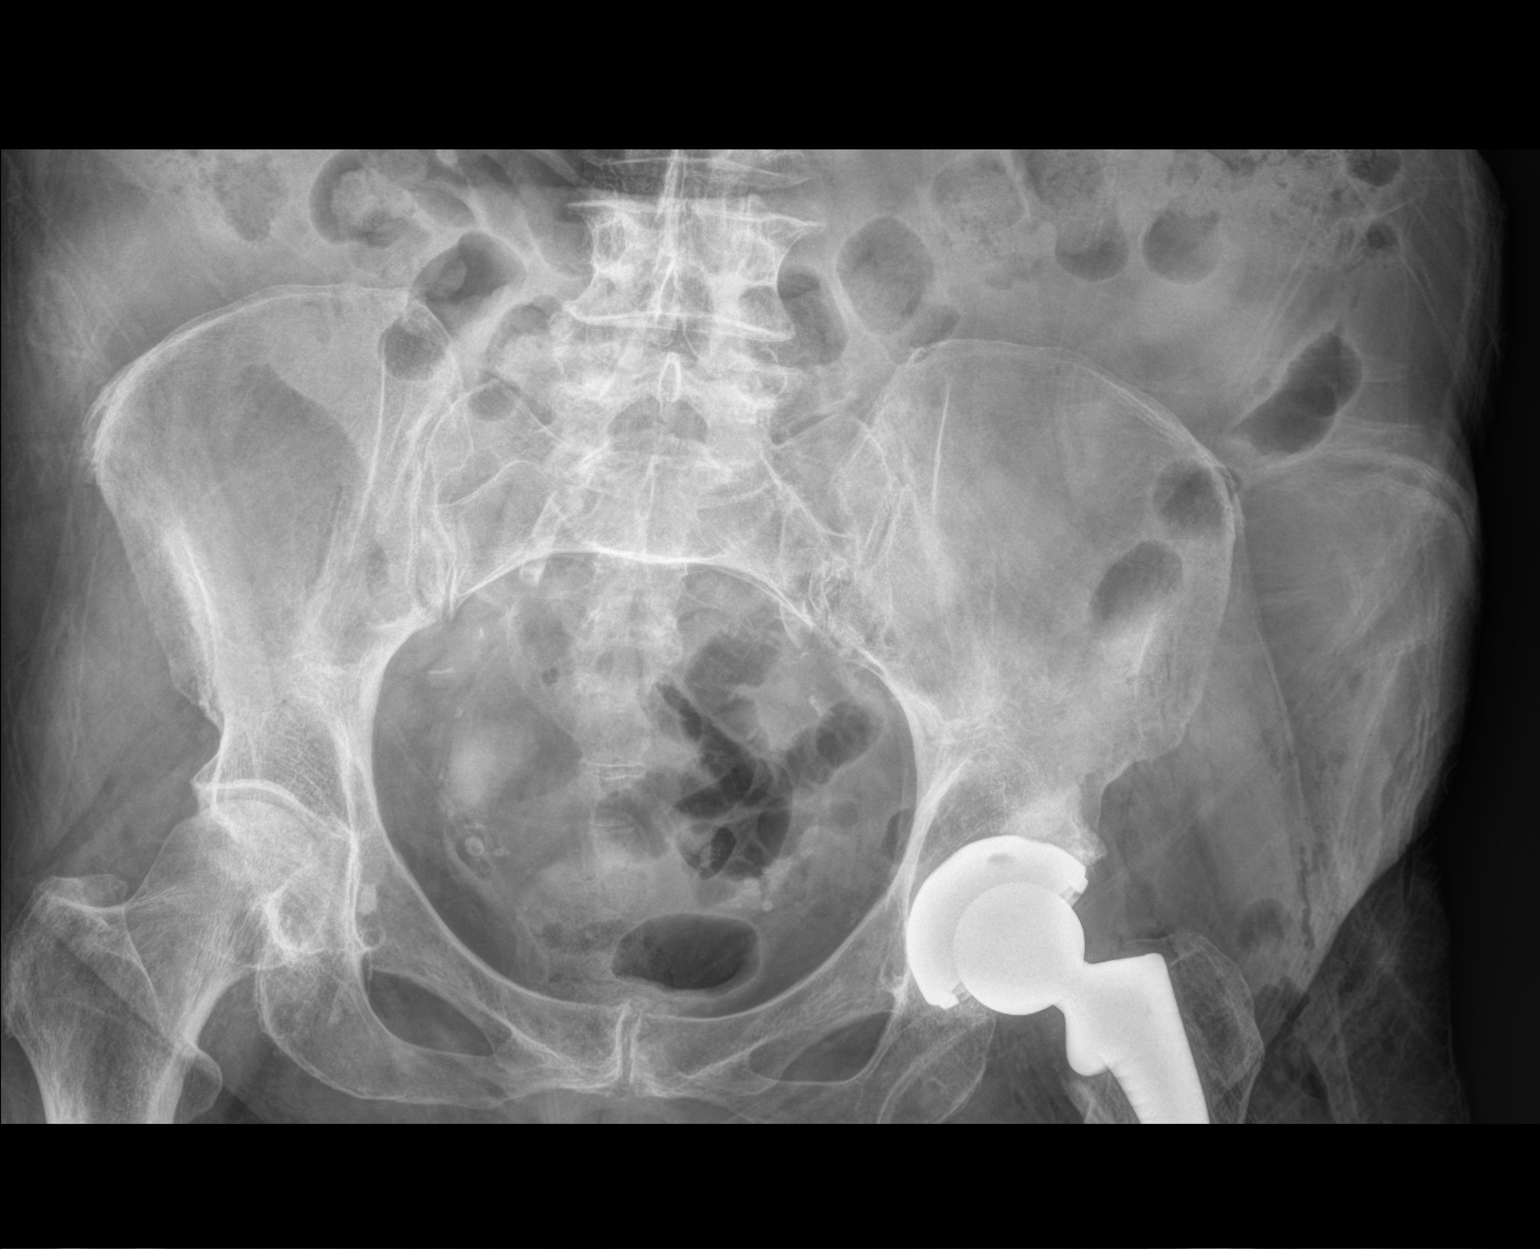

[pelvis ap (2 of 2)]
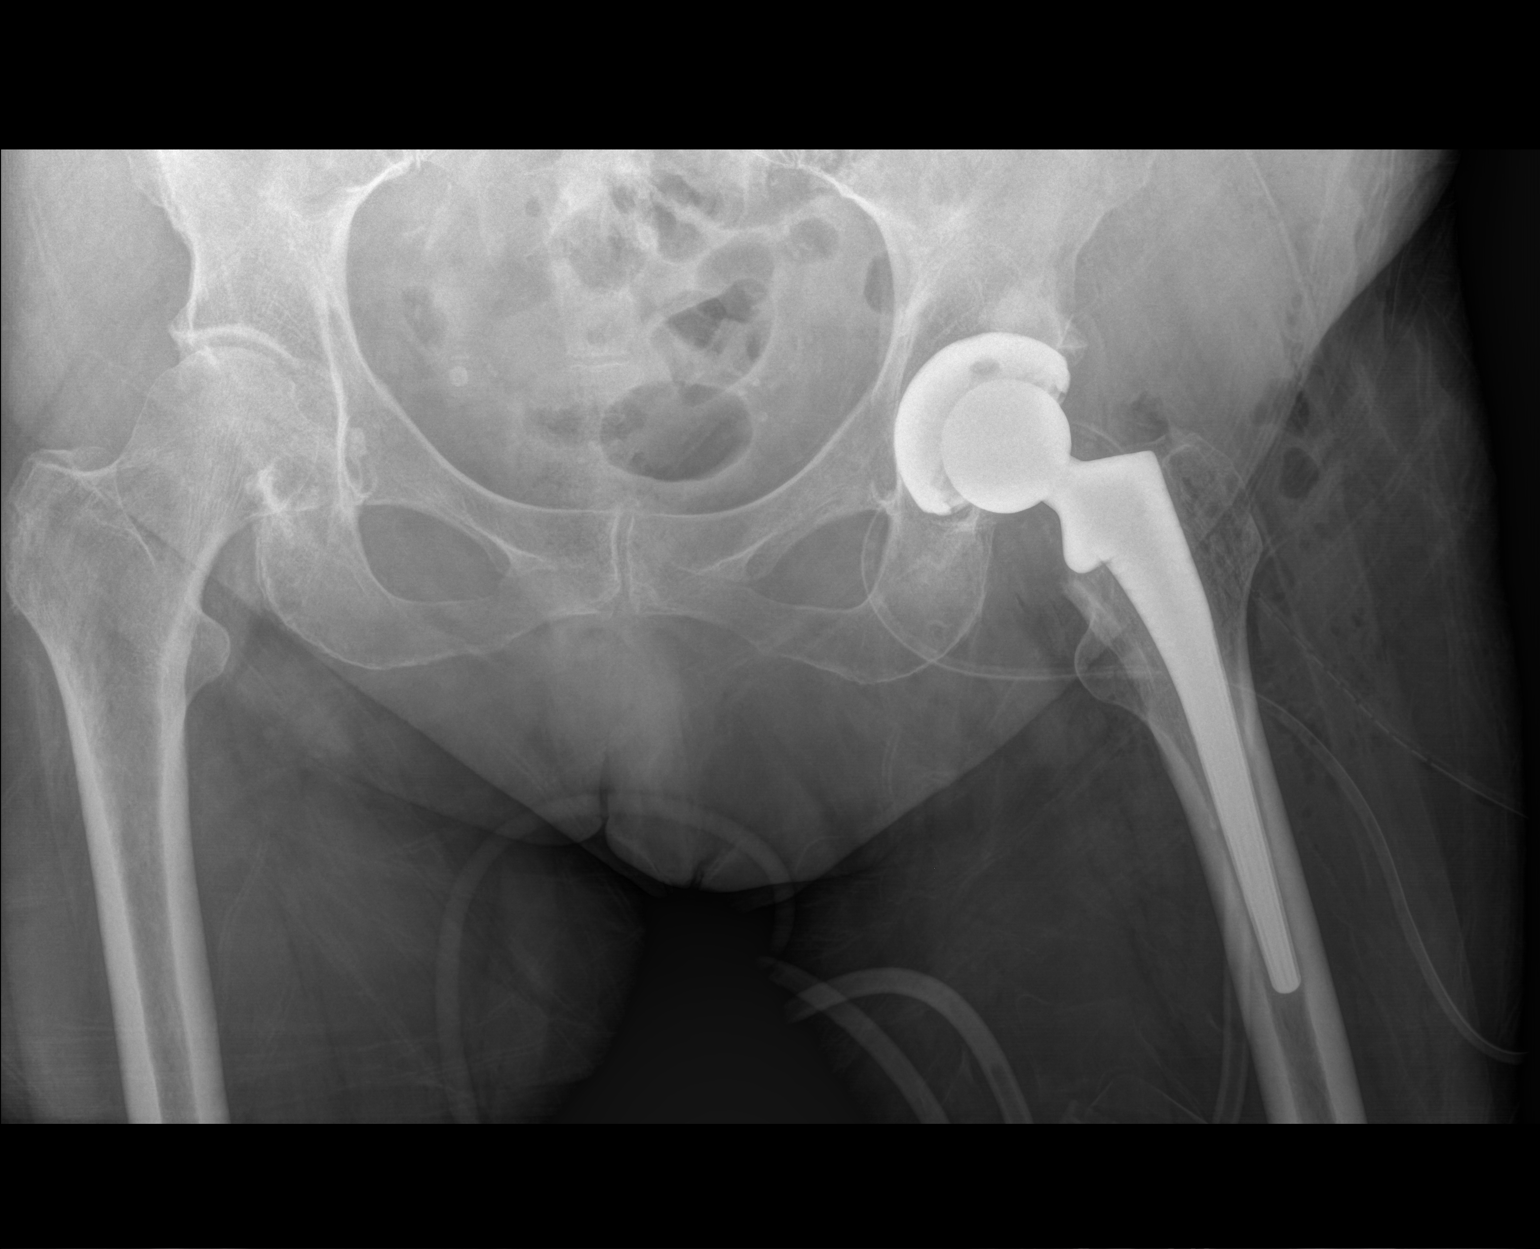

[2 of 2 positions shown; findings below may reference images not displayed]

FINDINGS: Left hip total arthroplasty is noted. Alignment is anatomic on a
single AP view. A drain is in place. The pelvis otherwise intact.
Degenerative changes are again noted in the lower lumbar spine and
SI joints.
IMPRESSION: 1. Left total hip arthroplasty without radiographic evidence for
complication.

## 2017-10-20 ENCOUNTER — Other Ambulatory Visit: Payer: Self-pay | Admitting: Family Medicine

## 2017-10-20 DIAGNOSIS — E7800 Pure hypercholesterolemia, unspecified: Secondary | ICD-10-CM

## 2017-10-21 ENCOUNTER — Ambulatory Visit (INDEPENDENT_AMBULATORY_CARE_PROVIDER_SITE_OTHER): Payer: Medicare Other | Admitting: Family Medicine

## 2017-10-21 ENCOUNTER — Other Ambulatory Visit: Payer: Self-pay

## 2017-10-21 ENCOUNTER — Encounter: Payer: Self-pay | Admitting: Family Medicine

## 2017-10-21 VITALS — BP 142/70 | HR 81 | Temp 99.1°F | Ht 64.0 in | Wt 151.0 lb

## 2017-10-21 DIAGNOSIS — E039 Hypothyroidism, unspecified: Secondary | ICD-10-CM | POA: Diagnosis not present

## 2017-10-21 DIAGNOSIS — E7800 Pure hypercholesterolemia, unspecified: Secondary | ICD-10-CM | POA: Diagnosis not present

## 2017-10-21 DIAGNOSIS — J029 Acute pharyngitis, unspecified: Secondary | ICD-10-CM | POA: Diagnosis not present

## 2017-10-21 DIAGNOSIS — I10 Essential (primary) hypertension: Secondary | ICD-10-CM | POA: Diagnosis not present

## 2017-10-21 LAB — POCT RAPID STREP A (OFFICE): RAPID STREP A SCREEN: NEGATIVE

## 2017-10-21 MED ORDER — AMLODIPINE BESYLATE 5 MG PO TABS: 5.0000 mg | ORAL_TABLET | Freq: Every day | ORAL | 2 refills | Status: DC

## 2017-10-21 MED ORDER — SIMVASTATIN 20 MG PO TABS: ORAL_TABLET | ORAL | 2 refills | Status: DC

## 2017-10-21 MED ORDER — LEVOTHYROXINE SODIUM 50 MCG PO TABS: 50.0000 ug | ORAL_TABLET | Freq: Every day | ORAL | 3 refills | Status: DC

## 2017-10-21 MED ORDER — CHLORTHALIDONE 25 MG PO TABS: ORAL_TABLET | ORAL | 2 refills | Status: DC

## 2017-10-21 NOTE — Progress Notes (Signed)
Subjective:  By signing my name below, I, Essence Howell, attest that this documentation has been prepared under the direction and in the presence of Wendie Agreste, MD Electronically Signed: Ladene Lambert, ED Scribe 10/21/2017 at 11:56 AM.   Patient ID: Molly Lambert, female    DOB: [birth year], 83 y.o.   MRN: [id number]  Chief Complaint  Patient presents with  . chronic condition    6 month f/u   . Sore Throat    started yesterday    HPI Molly Lambert is a 83 y.o. female who presents to Primary Care at [facility] for f/u. Pt is fasting at this visit.  HTN Lab Results  Component Value Date   CREATININE 0.78 04/24/2017   BP Readings from Last 3 Encounters:  10/21/17 (!) 142/70  04/24/17 138/70  11/21/16 129/68  Norvasc 5 mg qd, chlorthalidone 25 mg qd. - Pt does not check her BP outside of the office. Denies any side-effects.  Hyperlipidemia Lab Results  Component Value Date   CHOL 202 (H) 04/24/2017   HDL 72 04/24/2017   LDLCALC 96 04/24/2017   LDLDIRECT 131 (H) 08/22/2011   TRIG 172 (H) 04/24/2017   CHOLHDL 2.8 04/24/2017   Lab Results  Component Value Date   ALT 15 04/24/2017   AST 17 04/24/2017   ALKPHOS 109 04/24/2017   BILITOT 0.4 04/24/2017  Zocor 20 mg qd. - Denies new side-effects.  Hypothyroidism Synthroid 50 mcg qd. - Denies heat/cold intolerance, palpitations, changes in skin, hair, nails, weight.  Sore Throat Sore throat started yesterday. Reports associated chills and some congestion. Denies fever or sick contacts. She has tried gargling but no meds tried PTA.  Patient Active Problem List   Diagnosis Date Noted  . OA (osteoarthritis) of hip 07/17/2016  . Breast microcalcifications 12/27/2011  . History of breast cancer 12/27/2011  . Hypothyroidism 08/25/2011  . Hypercholesteremia 08/25/2011  . HTN (hypertension) 08/25/2011  . Malignant neoplasm of central portion of right breast in female, estrogen receptor positive ([place]) 02/27/2011  . Breast  mass, right 11/28/2010   Past Medical History:  Diagnosis Date  . Arthritis   . Breast cancer ([place]) 11/2010   Rt breast  . Cataract   . Erythema    reddened area of epidermis spnning concentrated around sinuses on the face; pt reports cracking, itchiness, and flaking  of affected skin; seen derm no absolute dx, will get 2nd opinion   . Hypercholesteremia   . Hypertension    NO CARDIAC MD,  [facility]  PCP  ,  URGENT MED  . Hypothyroidism   . Leg laceration    WITH SURGERY  . Paget's carcinoma of the nipple, right (HCC)    nipple removed  . Personal history of radiation therapy   . S/P radiation therapy 03/14/11 - 04/05/11   Right Breast / 4256 cGy in 16 Fractions  . Thyroid disease    Past Surgical History:  Procedure Laterality Date  . BREAST BIOPSY    . BREAST LUMPECTOMY  01/22/11   RIGHT BREAST LUMPECTOMY WITH BIOSPY OF 1 SENTINEL NODE, INVASIVE GRADE II DUCTAL CARCINOMA , INTERMEDIATE GRADE DUCTAL CARCINOMA IN SITU , DERMAL SKIN INVOLVED, MARGINS NEGATIVE,( 0/1)  NODE POSITIVE, ER+, PR+, LOW s-PHASE, HER 2 NEU- NO AMPLIFICATION  . BREAST SURGERY  11/28/10   SKIN-PUNCH BIOSPY RIGHT BREAST(NIPPLE), ER+, RP+, LOW S-PHASE, HER 2NEU NEGAITIVE  . EYE SURGERY      BILATERAL CATARACT SURGERY  . JOINT REPLACEMENT  left hip  . PARTIAL MASTECTOMY WITH NEEDLE LOCALIZATION  01/10/2012   Procedure: PARTIAL MASTECTOMY WITH NEEDLE LOCALIZATION;  Surgeon: Marcello Moores A. Cornett, MD;  Location: [place];  Service: General;  Laterality: Right;  right breast needle localized partial mastectomy and removal of nipple   . TOTAL HIP ARTHROPLASTY Left 07/17/2016   Procedure: LEFT TOTAL HIP ARTHROPLASTY ANTERIOR APPROACH;  Surgeon: Gaynelle Arabian, MD;  Location: [facility];  Service: Orthopedics;  Laterality: Left;  . TOTAL HIP ARTHROPLASTY Right 11/20/2016   Procedure: RIGHT TOTAL HIP ARTHROPLASTY ANTERIOR APPROACH;  Surgeon: Gaynelle Arabian, MD;  Location: [facility];  Service: Orthopedics;   Laterality: Right;  . TUBAL LIGATION  1972   Allergies  Allergen Reactions  . Xarelto [Rivaroxaban]    Prior to Admission medications   Medication Sig Start Date End Date Taking? Authorizing Provider  acetaminophen (TYLENOL) 500 MG tablet Take 1,000 mg by mouth every 8 (eight) hours as needed for mild pain or headache.    [provider]  amLODipine (NORVASC) 5 MG tablet Take 1 tablet (5 mg total) by mouth daily. 04/24/17   Wendie Agreste, MD  chlorthalidone (HYGROTON) 25 MG tablet TAKE 1 TABLET(25 MG) BY MOUTH DAILY 07/22/17   Wendie Agreste, MD  levothyroxine (SYNTHROID, LEVOTHROID) 50 MCG tablet Take 1 tablet (50 mcg total) by mouth daily before breakfast. 04/24/17   Wendie Agreste, MD  methocarbamol (ROBAXIN) 500 MG tablet Take 1 tablet (500 mg total) by mouth every 6 (six) hours as needed for muscle spasms. 11/21/16   Perkins, Alexzandrew L, PA-C  oxyCODONE (OXY IR/ROXICODONE) 5 MG immediate release tablet Take 1-2 tablets (5-10 mg total) by mouth every 4 (four) hours as needed for moderate pain or severe pain. 11/21/16   Perkins, Alexzandrew L, PA-C  simvastatin (ZOCOR) 20 MG tablet TAKE 1 TABLET(20 MG) BY MOUTH DAILY 10/20/17   Wendie Agreste, MD  traMADol (ULTRAM) 50 MG tablet Take 1 tablet (50 mg total) by mouth every 6 (six) hours as needed for moderate pain. 04/24/17   Wendie Agreste, MD   Social History   Socioeconomic History  . Marital status: Married    Spouse name: Not on file  . Number of children: 3  . Years of education: 61  . Highest education level: Not on file  Occupational History  . Occupation: Waitress    Comment: Secretary/administrator  Social Needs  . Financial resource strain: Not on file  . Food insecurity:    Worry: Not on file    Inability: Not on file  . Transportation needs:    Medical: Not on file    Non-medical: Not on file  Tobacco Use  . Smoking status: Never Smoker  . Smokeless tobacco: Never Used  Substance and Sexual  Activity  . Alcohol use: No    Alcohol/week: 0.0 standard drinks    Comment: Rarely/On Special Occasions  . Drug use: No  . Sexual activity: Not on file    Comment: G3, P3, MENARCHE, AGE 50, MENOPAUSE AGE 72, NO BC AND HRT X 10 YEARS  Lifestyle  . Physical activity:    Days per week: Not on file    Minutes per session: Not on file  . Stress: Not on file  Relationships  . Social connections:    Talks on phone: Not on file    Gets together: Not on file    Attends religious service: Not on file    Active member of club  or organization: Not on file    Attends meetings of clubs or organizations: Not on file    Relationship status: Not on file  . Intimate partner violence:    Fear of current or ex partner: Not on file    Emotionally abused: Not on file    Physically abused: Not on file    Forced sexual activity: Not on file  Other Topics Concern  . Not on file  Social History Narrative   Married for 56 years      3 children, one deceased   30 grand-children, 3 great-grandchildren   Review of Systems  Constitutional: Positive for chills. Negative for fatigue, fever and unexpected weight change.  HENT: Positive for congestion (mild) and sore throat.   Respiratory: Negative for chest tightness and shortness of breath.   Cardiovascular: Negative for chest pain, palpitations and leg swelling.  Gastrointestinal: Negative for abdominal pain and blood in stool.  Endocrine: Negative for cold intolerance and heat intolerance.  Musculoskeletal: Negative for myalgias.  Skin: Negative for color change.  Neurological: Negative for dizziness, syncope, light-headedness and headaches.      Objective:   Physical Exam  Constitutional: She is oriented to person, place, and time. She appears well-developed and well-nourished. No distress.  HENT:  Head: Normocephalic and atraumatic.  Right Ear: Hearing, tympanic membrane, external ear and ear canal normal.  Left Ear: Hearing, tympanic membrane,  external ear and ear canal normal.  Nose: Nose normal. Right sinus exhibits no maxillary sinus tenderness and no frontal sinus tenderness. Left sinus exhibits no maxillary sinus tenderness and no frontal sinus tenderness.  Mouth/Throat: Posterior oropharyngeal erythema (minimal) present. No oropharyngeal exudate.  TMs: Cerumen. Unable to visualize TMs.  Eyes: Pupils are equal, round, and reactive to light. Conjunctivae and EOM are normal.  Neck: Carotid bruit is not present. No thyroid mass and no thyromegaly present.  No palpable thyroid modules/masses.  Cardiovascular: Normal rate, regular rhythm, normal heart sounds and intact distal pulses.  No murmur heard. Pulmonary/Chest: Effort normal and breath sounds normal. No respiratory distress. She has no wheezes. She has no rhonchi.  Abdominal: Soft. She exhibits no pulsatile midline mass. There is no tenderness.  Lymphadenopathy:    She has no cervical adenopathy.  Neurological: She is alert and oriented to person, place, and time.  Skin: Skin is warm and dry. No rash noted.  Psychiatric: She has a normal mood and affect. Her behavior is normal.  Vitals reviewed.  Vitals:   10/21/17 1140 10/21/17 1143  BP: (!) 154/78 (!) 142/70  Pulse: 81   Temp: 99.1 F (37.3 C)   TempSrc: Oral   SpO2: 95%   Weight: 151 lb (68.5 kg)   Height: 5\' 4"  (1.626 m)       Assessment & Plan:    Molly Lambert is a 83 y.o. female Sore throat - Plan: POCT rapid strep A, Culture, Group A Strep  -Suspected viral illness, strep throat culture obtained but unlikely.  Symptomatic care discussed with RTC precautions  -Excessive cerumen was noted, but asymptomatic at present.  RTC precautions given for possible lavage  Hypothyroidism, unspecified type - Plan: levothyroxine (SYNTHROID, LEVOTHROID) 50 MCG tablet, TSH  -Check TSH, continue Synthroid same dose for now.  Essential hypertension - Plan: chlorthalidone (HYGROTON) 25 MG tablet, amLODipine (NORVASC) 5  MG tablet, Comprehensive metabolic panel  -Borderline elevated, but better with recheck.  Monitor home readings, check labs, continue same dose of meds above for now.  RTC precautions  Hypercholesteremia - Plan: simvastatin (ZOCOR) 20 MG tablet, Lipid panel, Comprehensive metabolic panel  -Tolerating Zocor, check labs, continue same dose.  Meds ordered this encounter  Medications  . levothyroxine (SYNTHROID, LEVOTHROID) 50 MCG tablet    Sig: Take 1 tablet (50 mcg total) by mouth daily before breakfast.    Dispense:  90 tablet    Refill:  3  . chlorthalidone (HYGROTON) 25 MG tablet    Sig: TAKE 1 TABLET(25 MG) BY MOUTH DAILY    Dispense:  90 tablet    Refill:  2  . amLODipine (NORVASC) 5 MG tablet    Sig: Take 1 tablet (5 mg total) by mouth daily.    Dispense:  90 tablet    Refill:  2  . simvastatin (ZOCOR) 20 MG tablet    Sig: TAKE 1 TABLET(20 MG) BY MOUTH DAILY    Dispense:  90 tablet    Refill:  2   Patient Instructions     See info on sore throat. Strep test today was negative. You may have a early virus. cepacol or other lozenge can help. Fluids and rest. Return to the clinic or go to the nearest emergency room if any of your symptoms worsen or new symptoms occur.  Keep a record of your blood pressures outside of the office and if over 150/90 - would recommend changing meds.  Excess ear wax seen, see info below. If ears remain blocked, please return to have those cleaned. Thanks for coming in today.    Earwax Buildup, Adult The ears produce a substance called earwax that helps keep bacteria out of the ear and protects the skin in the ear canal. Occasionally, earwax can build up in the ear and cause discomfort or hearing loss. What increases the risk? This condition is more likely to develop in people who:  Are female.  Are elderly.  Naturally produce more earwax.  Clean their ears often with cotton swabs.  Use earplugs often.  Use in-ear headphones often.  Wear  hearing aids.  Have narrow ear canals.  Have earwax that is overly thick or sticky.  Have eczema.  Are dehydrated.  Have excess hair in the ear canal.  What are the signs or symptoms? Symptoms of this condition include:  Reduced or muffled hearing.  A feeling of fullness in the ear or feeling that the ear is plugged.  Fluid coming from the ear.  Ear pain.  Ear itch.  Ringing in the ear.  Coughing.  An obvious piece of earwax that can be seen inside the ear canal.  How is this diagnosed? This condition may be diagnosed based on:  Your symptoms.  Your medical history.  An ear exam. During the exam, your health care provider will look into your ear with an instrument called an otoscope.  You may have tests, including a hearing test. How is this treated? This condition may be treated by:  Using ear drops to soften the earwax.  Having the earwax removed by a health care provider. The health care provider may: ? Flush the ear with water. ? Use an instrument that has a loop on the end (curette). ? Use a suction device.  Surgery to remove the wax buildup. This may be done in severe cases.  Follow these instructions at home:  Take over-the-counter and prescription medicines only as told by your health care provider.  Do not put any objects, including cotton swabs, into your ear. You can clean the opening of your ear  canal with a washcloth or facial tissue.  Follow instructions from your health care provider about cleaning your ears. Do not over-clean your ears.  Drink enough fluid to keep your urine clear or pale yellow. This will help to thin the earwax.  Keep all follow-up visits as told by your health care provider. If earwax builds up in your ears often or if you use hearing aids, consider seeing your health care provider for routine, preventive ear cleanings. Ask your health care provider how often you should schedule your cleanings.  If you have hearing  aids, clean them according to instructions from the manufacturer and your health care provider. Contact a health care provider if:  You have ear pain.  You develop a fever.  You have blood, pus, or other fluid coming from your ear.  You have hearing loss.  You have ringing in your ears that does not go away.  Your symptoms do not improve with treatment.  You feel like the room is spinning (vertigo). Summary  Earwax can build up in the ear and cause discomfort or hearing loss.  The most common symptoms of this condition include reduced or muffled hearing and a feeling of fullness in the ear or feeling that the ear is plugged.  This condition may be diagnosed based on your symptoms, your medical history, and an ear exam.  This condition may be treated by using ear drops to soften the earwax or by having the earwax removed by a health care provider.  Do not put any objects, including cotton swabs, into your ear. You can clean the opening of your ear canal with a washcloth or facial tissue. This information is not intended to replace advice given to you by your health care provider. Make sure you discuss any questions you have with your health care provider. Document Released: 03/21/2004 Document Revised: 04/24/2016 Document Reviewed: 04/24/2016 Elsevier Interactive Patient Education  2018 [place].    Sore Throat A sore throat is pain, burning, irritation, or scratchiness in the throat. When you have a sore throat, you may feel pain or tenderness in your throat when you swallow or talk. Many things can cause a sore throat, including:  An infection.  Seasonal allergies.  Dryness in the air.  Irritants, such as smoke or pollution.  Gastroesophageal reflux disease (GERD).  A tumor.  A sore throat is often the first sign of another sickness. It may happen with other symptoms, such as coughing, sneezing, fever, and swollen neck glands. Most sore throats go away without  medical treatment. Follow these instructions at home:  Take over-the-counter medicines only as told by your health care provider.  Drink enough fluids to keep your urine clear or pale yellow.  Rest as needed.  To help with pain, try: ? Sipping warm liquids, such as broth, herbal tea, or warm water. ? Eating or drinking cold or frozen liquids, such as frozen ice pops. ? Gargling with a salt-water mixture 3-4 times a day or as needed. To make a salt-water mixture, completely dissolve -1 tsp of salt in 1 cup of warm water. ? Sucking on hard candy or throat lozenges. ? Putting a cool-mist humidifier in your bedroom at night to moisten the air. ? Sitting in the bathroom with the door closed for 5-10 minutes while you run hot water in the shower.  Do not use any tobacco products, such as cigarettes, chewing tobacco, and e-cigarettes. If you need help quitting, ask your health care provider. Contact  a health care provider if:  You have a fever for more than 2-3 days.  You have symptoms that last (are persistent) for more than 2-3 days.  Your throat does not get better within 7 days.  You have a fever and your symptoms suddenly get worse. Get help right away if:  You have difficulty breathing.  You cannot swallow fluids, soft foods, or your saliva.  You have increased swelling in your throat or neck.  You have persistent nausea and vomiting. This information is not intended to replace advice given to you by your health care provider. Make sure you discuss any questions you have with your health care provider. Document Released: 03/21/2004 Document Revised: 10/08/2015 Document Reviewed: 12/02/2014 Elsevier Interactive Patient Education  [employer].   If you have lab work done today you will be contacted with your lab results within the next 2 weeks.  If you have not heard from [place] then please contact us. The fastest way to get your results is to register for My Chart.   IF  you received an x-ray today, you will receive an invoice from [facility]. Please contact [facility] at [phone] with questions or concerns regarding your invoice.   IF you received labwork today, you will receive an invoice from [place]. Please contact [facility] at [phone] with questions or concerns regarding your invoice.   Our billing staff will not be able to assist you with questions regarding bills from these companies.  You will be contacted with the lab results as soon as they are available. The fastest way to get your results is to activate your My Chart account. Instructions are located on the last page of this paperwork. If you have not heard from [place] regarding the results in 2 weeks, please contact this office.      I personally performed the services described in this documentation, which was scribed in my presence. The recorded information has been reviewed and considered for accuracy and completeness, addended by me as needed, and agree with information above.  Signed,   [name], MD [facility] [place].  10/25/17 3:21 PM

## 2017-10-21 NOTE — Patient Instructions (Addendum)
See info on sore throat. Strep test today was negative. You may have a early virus. cepacol or other lozenge can help. Fluids and rest. Return to the clinic or go to the nearest emergency room if any of your symptoms worsen or new symptoms occur.  Keep a record of your blood pressures outside of the office and if over 150/90 - would recommend changing meds.  Excess ear wax seen, see info below. If ears remain blocked, please return to have those cleaned. Thanks for coming in today.    Earwax Buildup, Adult The ears produce a substance called earwax that helps keep bacteria out of the ear and protects the skin in the ear canal. Occasionally, earwax can build up in the ear and cause discomfort or hearing loss. What increases the risk? This condition is more likely to develop in people who:  Are female.  Are elderly.  Naturally produce more earwax.  Clean their ears often with cotton swabs.  Use earplugs often.  Use in-ear headphones often.  Wear hearing aids.  Have narrow ear canals.  Have earwax that is overly thick or sticky.  Have eczema.  Are dehydrated.  Have excess hair in the ear canal.  What are the signs or symptoms? Symptoms of this condition include:  Reduced or muffled hearing.  A feeling of fullness in the ear or feeling that the ear is plugged.  Fluid coming from the ear.  Ear pain.  Ear itch.  Ringing in the ear.  Coughing.  An obvious piece of earwax that can be seen inside the ear canal.  How is this diagnosed? This condition may be diagnosed based on:  Your symptoms.  Your medical history.  An ear exam. During the exam, your health care provider will look into your ear with an instrument called an otoscope.  You may have tests, including a hearing test. How is this treated? This condition may be treated by:  Using ear drops to soften the earwax.  Having the earwax removed by a health care provider. The health care provider  may: ? Flush the ear with water. ? Use an instrument that has a loop on the end (curette). ? Use a suction device.  Surgery to remove the wax buildup. This may be done in severe cases.  Follow these instructions at home:  Take over-the-counter and prescription medicines only as told by your health care provider.  Do not put any objects, including cotton swabs, into your ear. You can clean the opening of your ear canal with a washcloth or facial tissue.  Follow instructions from your health care provider about cleaning your ears. Do not over-clean your ears.  Drink enough fluid to keep your urine clear or pale yellow. This will help to thin the earwax.  Keep all follow-up visits as told by your health care provider. If earwax builds up in your ears often or if you use hearing aids, consider seeing your health care provider for routine, preventive ear cleanings. Ask your health care provider how often you should schedule your cleanings.  If you have hearing aids, clean them according to instructions from the manufacturer and your health care provider. Contact a health care provider if:  You have ear pain.  You develop a fever.  You have blood, pus, or other fluid coming from your ear.  You have hearing loss.  You have ringing in your ears that does not go away.  Your symptoms do not improve with treatment.  You  feel like the room is spinning (vertigo). Summary  Earwax can build up in the ear and cause discomfort or hearing loss.  The most common symptoms of this condition include reduced or muffled hearing and a feeling of fullness in the ear or feeling that the ear is plugged.  This condition may be diagnosed based on your symptoms, your medical history, and an ear exam.  This condition may be treated by using ear drops to soften the earwax or by having the earwax removed by a health care provider.  Do not put any objects, including cotton swabs, into your ear. You can  clean the opening of your ear canal with a washcloth or facial tissue. This information is not intended to replace advice given to you by your health care provider. Make sure you discuss any questions you have with your health care provider. Document Released: 03/21/2004 Document Revised: 04/24/2016 Document Reviewed: 04/24/2016 Elsevier Interactive Patient Education  2018 [place].    Sore Throat A sore throat is pain, burning, irritation, or scratchiness in the throat. When you have a sore throat, you may feel pain or tenderness in your throat when you swallow or talk. Many things can cause a sore throat, including:  An infection.  Seasonal allergies.  Dryness in the air.  Irritants, such as smoke or pollution.  Gastroesophageal reflux disease (GERD).  A tumor.  A sore throat is often the first sign of another sickness. It may happen with other symptoms, such as coughing, sneezing, fever, and swollen neck glands. Most sore throats go away without medical treatment. Follow these instructions at home:  Take over-the-counter medicines only as told by your health care provider.  Drink enough fluids to keep your urine clear or pale yellow.  Rest as needed.  To help with pain, try: ? Sipping warm liquids, such as broth, herbal tea, or warm water. ? Eating or drinking cold or frozen liquids, such as frozen ice pops. ? Gargling with a salt-water mixture 3-4 times a day or as needed. To make a salt-water mixture, completely dissolve -1 tsp of salt in 1 cup of warm water. ? Sucking on hard candy or throat lozenges. ? Putting a cool-mist humidifier in your bedroom at night to moisten the air. ? Sitting in the bathroom with the door closed for 5-10 minutes while you run hot water in the shower.  Do not use any tobacco products, such as cigarettes, chewing tobacco, and e-cigarettes. If you need help quitting, ask your health care provider. Contact a health care provider if:  You  have a fever for more than 2-3 days.  You have symptoms that last (are persistent) for more than 2-3 days.  Your throat does not get better within 7 days.  You have a fever and your symptoms suddenly get worse. Get help right away if:  You have difficulty breathing.  You cannot swallow fluids, soft foods, or your saliva.  You have increased swelling in your throat or neck.  You have persistent nausea and vomiting. This information is not intended to replace advice given to you by your health care provider. Make sure you discuss any questions you have with your health care provider. Document Released: 03/21/2004 Document Revised: 10/08/2015 Document Reviewed: 12/02/2014 Elsevier Interactive Patient Education  [employer].   If you have lab work done today you will be contacted with your lab results within the next 2 weeks.  If you have not heard from [place] then please contact us.  The fastest way to get your results is to register for My Chart.   IF you received an x-ray today, you will receive an invoice from [facility]. Please contact [facility] at [phone] with questions or concerns regarding your invoice.   IF you received labwork today, you will receive an invoice from [facility]. Please contact [facility] at [phone] with questions or concerns regarding your invoice.   Our billing staff will not be able to assist you with questions regarding bills from these companies.  You will be contacted with the lab results as soon as they are available. The fastest way to get your results is to activate your My Chart account. Instructions are located on the last page of this paperwork. If you have not heard from [place] regarding the results in 2 weeks, please contact this office.

## 2017-10-22 LAB — COMPREHENSIVE METABOLIC PANEL
ALT: 13 IU/L (ref 0–32)
AST: 14 IU/L (ref 0–40)
Albumin/Globulin Ratio: 1.5 (ref 1.2–2.2)
Albumin: 4.2 g/dL (ref 3.5–4.7)
Alkaline Phosphatase: 92 IU/L (ref 39–117)
BUN/Creatinine Ratio: 20 (ref 12–28)
BUN: 17 mg/dL (ref 8–27)
Bilirubin Total: 0.4 mg/dL (ref 0.0–1.2)
CO2: 27 mmol/L (ref 20–29)
Calcium: 9.6 mg/dL (ref 8.7–10.3)
Chloride: 95 mmol/L — ABNORMAL LOW (ref 96–106)
Creatinine, Ser: 0.85 mg/dL (ref 0.57–1.00)
GFR, EST AFRICAN AMERICAN: 74 mL/min/{1.73_m2} (ref >59)
GFR, EST NON AFRICAN AMERICAN: 64 mL/min/{1.73_m2} (ref >59)
GLUCOSE: 109 mg/dL — AB (ref 65–99)
Globulin, Total: 2.8 g/dL (ref 1.5–4.5)
Potassium: 3.5 mmol/L (ref 3.5–5.2)
Sodium: 140 mmol/L (ref 134–144)
TOTAL PROTEIN: 7 g/dL (ref 6.0–8.5)

## 2017-10-22 LAB — LIPID PANEL
CHOL/HDL RATIO: 3.1 ratio (ref 0.0–4.4)
Cholesterol, Total: 223 mg/dL — ABNORMAL HIGH (ref 100–199)
HDL: 71 mg/dL (ref >39)
LDL CALC: 125 mg/dL — AB (ref 0–99)
Triglycerides: 134 mg/dL (ref 0–149)
VLDL Cholesterol Cal: 27 mg/dL (ref 5–40)

## 2017-10-22 LAB — TSH: TSH: 0.529 u[IU]/mL (ref 0.450–4.500)

## 2017-10-24 LAB — CULTURE, GROUP A STREP

## 2017-10-28 ENCOUNTER — Telehealth: Payer: Self-pay | Admitting: Family Medicine

## 2017-10-28 NOTE — Telephone Encounter (Signed)
Copied from [place] [id number]. Topic: General - Other >> Oct 28, 2017 11:15 AM Yvette Rack wrote: Reason for CRM: Pt returned call for lab results. Pt requests call back. Cb# [id number]

## 2017-10-31 NOTE — Telephone Encounter (Signed)
Left detailed message regarding labs per ROI on voicemail  Strep throat culture was negative for typical strep throat bacteria, but some bacteria were noted. If her symptoms have improved, may not need further treatment. If still symptomatic, sometimes antibiotics can help with those types of infections. Let me know and I can send that in. Thyroid test was normal. Cholesterol has increased. Can increase her simvastatin to 2 pills/day with repeat testing in 6 weeks but if any new side effects on that dose, return to 1/day. If tolerated I can send in more medication. Electrolytes overall looked okay. Blood sugar was slightly elevated. Would recommend repeat testing in the next 4 to 6 weeks with a hemoglobin A1c for diabetes screening test. Advised to call office with any further questions or concerns. DGaddy, CMA

## 2017-12-21 IMAGING — DX DG PORTABLE PELVIS
1 series · 1 of 1 positions shown · non-contrast
Comparison: Radiographs July 17, 2016.

CLINICAL DATA: Status post right hip arthroplasty.

EXAM:
PORTABLE PELVIS 1-2 VIEWS

[pelvis ap]
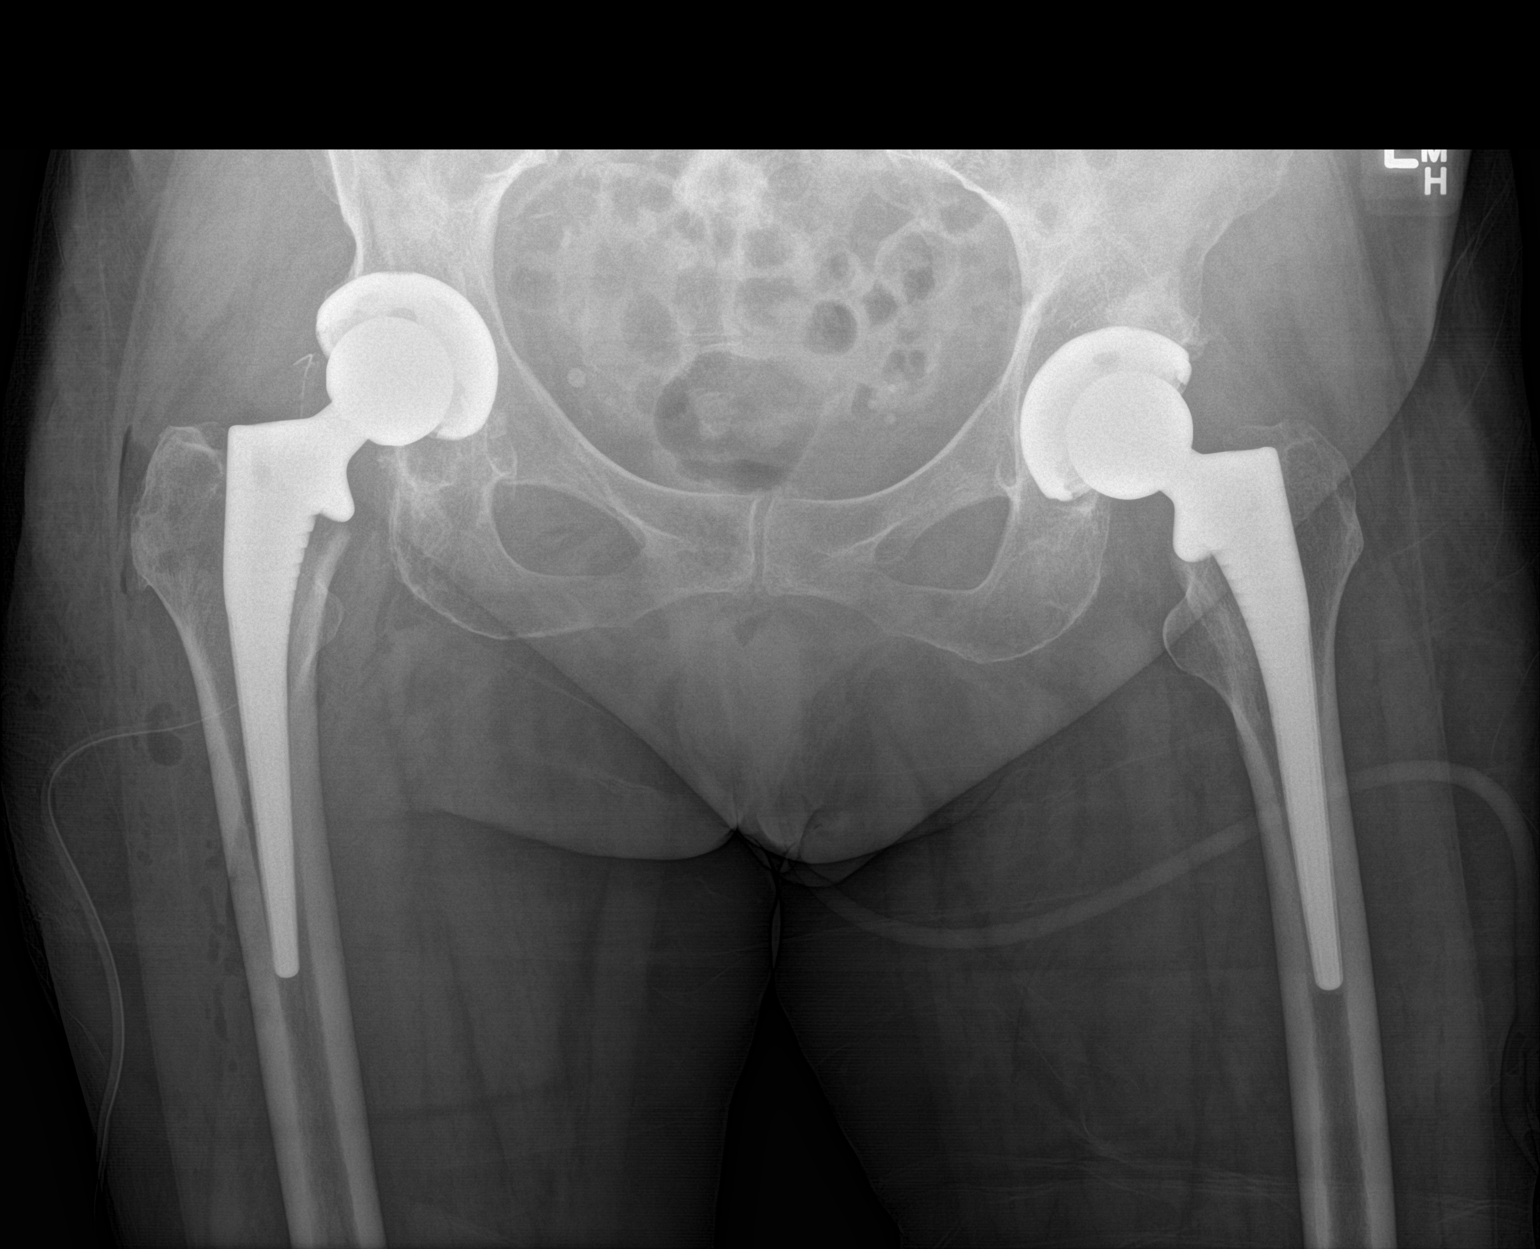

[1 of 1 positions shown; findings below may reference images not displayed]

FINDINGS: Previously noted left hip arthroplasty is again noted. Interval
placement of total right hip arthroplasty. The femoral and
acetabular components appear to be well situated. Surgical drain in
other expected postoperative changes are noted in the surrounding
soft tissues. No fracture or dislocation is noted.
IMPRESSION: Interval placement of right total hip arthroplasty.

## 2018-01-13 ENCOUNTER — Other Ambulatory Visit: Payer: Self-pay | Admitting: Family Medicine

## 2018-01-13 DIAGNOSIS — Z1231 Encounter for screening mammogram for malignant neoplasm of breast: Secondary | ICD-10-CM

## 2018-02-05 ENCOUNTER — Telehealth: Payer: Self-pay | Admitting: Family Medicine

## 2018-02-05 NOTE — Telephone Encounter (Signed)
Called and spoke with pt regarding their appt with Dr. Carlota Raspberry on 04/27/18. Due to providers schedule change, I was able to reschedule pt to 04/28/18 at 10:40 AM. I advised of time, building and late policy. Pt acknowledged.

## 2018-02-10 ENCOUNTER — Other Ambulatory Visit: Payer: Self-pay | Admitting: Family Medicine

## 2018-02-10 DIAGNOSIS — E7800 Pure hypercholesterolemia, unspecified: Secondary | ICD-10-CM

## 2018-02-10 NOTE — Telephone Encounter (Signed)
Requested Prescriptions  Pending Prescriptions Disp Refills  . simvastatin (ZOCOR) 20 MG tablet [Pharmacy Med Name: SIMVASTATIN 20MG  TABLETS] 90 tablet 2    Sig: TAKE 1 TABLET(20 MG) BY MOUTH DAILY     Cardiovascular:  Antilipid - Statins Failed - 02/10/2018  3:08 PM      Failed - Total Cholesterol in normal range and within 360 days    Cholesterol, Total  Date Value Ref Range Status  10/21/2017 223 (H) 100 - 199 mg/dL Final         Failed - LDL in normal range and within 360 days    LDL Calculated  Date Value Ref Range Status  10/21/2017 125 (H) 0 - 99 mg/dL Final         Passed - HDL in normal range and within 360 days    HDL  Date Value Ref Range Status  10/21/2017 71 >39 mg/dL Final         Passed - Triglycerides in normal range and within 360 days    Triglycerides  Date Value Ref Range Status  10/21/2017 134 0 - 149 mg/dL Final         Passed - Patient is not pregnant      Passed - Valid encounter within last 12 months    Recent Outpatient Visits          3 months ago Sore throat   [facility], Ranell Patrick, MD   9 months ago Left leg swelling   [facility], Ranell Patrick, MD   1 year ago Hypothyroidism, unspecified type   [facility], Ranell Patrick, MD   1 year ago Hypertension, unspecified type   [facility], Ranell Patrick, MD   1 year ago Rash of face   [facility], Ranell Patrick, MD      Future Appointments            In 2 months Carlota Raspberry Ranell Patrick, MD [facility] [place], [facility]

## 2018-04-17 ENCOUNTER — Ambulatory Visit
Admission: RE | Admit: 2018-04-17 | Discharge: 2018-04-17 | Disposition: A | Discharge status: Home / Self Care | Payer: Medicare Other | Source: Ambulatory Visit | Attending: Family Medicine | Admitting: Family Medicine

## 2018-04-17 DIAGNOSIS — Z1231 Encounter for screening mammogram for malignant neoplasm of breast: Secondary | ICD-10-CM

## 2018-04-27 ENCOUNTER — Ambulatory Visit: Payer: Medicare Other | Admitting: Family Medicine

## 2018-04-28 ENCOUNTER — Ambulatory Visit: Payer: Medicare Other | Admitting: Family Medicine

## 2018-06-16 ENCOUNTER — Telehealth (INDEPENDENT_AMBULATORY_CARE_PROVIDER_SITE_OTHER): Payer: Medicare Other | Admitting: Family Medicine

## 2018-06-16 ENCOUNTER — Other Ambulatory Visit: Payer: Self-pay

## 2018-06-16 DIAGNOSIS — E7800 Pure hypercholesterolemia, unspecified: Secondary | ICD-10-CM

## 2018-06-16 DIAGNOSIS — I10 Essential (primary) hypertension: Secondary | ICD-10-CM

## 2018-06-16 DIAGNOSIS — E039 Hypothyroidism, unspecified: Secondary | ICD-10-CM

## 2018-06-16 MED ORDER — LEVOTHYROXINE SODIUM 50 MCG PO TABS: 50.0000 ug | ORAL_TABLET | Freq: Every day | ORAL | 3 refills | Status: DC

## 2018-06-16 MED ORDER — CHLORTHALIDONE 25 MG PO TABS: ORAL_TABLET | ORAL | 2 refills | Status: DC

## 2018-06-16 MED ORDER — AMLODIPINE BESYLATE 5 MG PO TABS: 5.0000 mg | ORAL_TABLET | Freq: Every day | ORAL | 2 refills | Status: DC

## 2018-06-16 MED ORDER — SIMVASTATIN 20 MG PO TABS: 20.0000 mg | ORAL_TABLET | Freq: Every day | ORAL | 2 refills | Status: DC

## 2018-06-16 NOTE — Progress Notes (Signed)
Virtual Visit via Telephone Note  I connected with Molly Lambert on 06/16/18 at 11:46 AM by telephone and verified that I am speaking with the correct person using two identifiers.   I discussed the limitations, risks, security and privacy concerns of performing an evaluation and management service by telephone and the availability of in person appointments. I also discussed with the patient that there may be a patient responsible charge related to this service. The patient expressed understanding and agreed to proceed, consent obtained  Chief complaint: HTN. Hypothyroidism  History of Present Illness:  Hypertension: BP Readings from Last 3 Encounters:  10/21/17 (!) 142/70  04/24/17 138/70  11/21/16 129/68   Lab Results  Component Value Date   CREATININE 0.85 10/21/2017  Currently on amlodipine 5 mg daily, chlorthalidone 25mg .  Home readings at daughter's house: usually borderline. Constitutional: Negative for fatigue and unexpected weight change.  Eyes: Negative for visual disturbance.  Respiratory: Negative for cough, chest tightness and shortness of breath.   Cardiovascular: Negative for chest pain, palpitations and leg swelling.  Gastrointestinal: Negative for abdominal pain and blood in stool.  Neurological: Negative for dizziness, light-headedness and headaches.     Hyperlipidemia:  Lab Results  Component Value Date   CHOL 223 (H) 10/21/2017   HDL 71 10/21/2017   LDLCALC 125 (H) 10/21/2017   LDLDIRECT 131 (H) 08/22/2011   TRIG 134 10/21/2017   CHOLHDL 3.1 10/21/2017   Lab Results  Component Value Date   ALT 13 10/21/2017   AST 14 10/21/2017   ALKPHOS 92 10/21/2017   BILITOT 0.4 10/21/2017  Slight elevation of LDL in August. Recommended trial of 40mg  dose of zocor, with repeat testing. On 20mg  now. Tried 2 per day for about a week - no issues. No new myalgias.    Hypothyroidism: Lab Results  Component Value Date   TSH 0.529 10/21/2017  Control  previously, has remained on Synthroid 50 mcg daily.    Patient Active Problem List   Diagnosis Date Noted   OA (osteoarthritis) of hip 07/17/2016   Breast microcalcifications 12/27/2011   History of breast cancer 12/27/2011   Hypothyroidism 08/25/2011   Hypercholesteremia 08/25/2011   HTN (hypertension) 08/25/2011   Malignant neoplasm of central portion of right breast in female, estrogen receptor positive ([place]) 02/27/2011   Breast mass, right 11/28/2010   Past Medical History:  Diagnosis Date   Arthritis    Breast cancer ([place]) 11/2010   Rt breast   Cataract    Erythema    reddened area of epidermis spnning concentrated around sinuses on the face; pt reports cracking, itchiness, and flaking  of affected skin; seen derm no absolute dx, will get 2nd opinion    Hypercholesteremia    Hypertension    NO CARDIAC MD,  [facility]  PCP  ,  URGENT MED   Hypothyroidism    Leg laceration    WITH SURGERY   Paget's carcinoma of the nipple, right ([place])    nipple removed   Personal history of radiation therapy    S/P radiation therapy 03/14/11 - 04/05/11   Right Breast / 4256 cGy in 16 Fractions   Thyroid disease    Past Surgical History:  Procedure Laterality Date   BREAST BIOPSY     BREAST LUMPECTOMY  01/22/11   RIGHT BREAST LUMPECTOMY WITH BIOSPY OF 1 SENTINEL NODE, INVASIVE GRADE II DUCTAL CARCINOMA , INTERMEDIATE GRADE DUCTAL CARCINOMA IN SITU , DERMAL SKIN INVOLVED, MARGINS NEGATIVE,( 0/1)  NODE POSITIVE, ER+, PR+, LOW  s-PHASE, HER 2 NEU- NO AMPLIFICATION   BREAST SURGERY  11/28/10   SKIN-PUNCH BIOSPY RIGHT BREAST(NIPPLE), ER+, RP+, LOW S-PHASE, HER 2NEU NEGAITIVE   EYE SURGERY      BILATERAL CATARACT SURGERY   JOINT REPLACEMENT     left hip   PARTIAL MASTECTOMY WITH NEEDLE LOCALIZATION  01/10/2012   Procedure: PARTIAL MASTECTOMY WITH NEEDLE LOCALIZATION;  Surgeon: Marcello Moores A. Cornett, MD;  Location: [facility];  Service: General;  Laterality:  Right;  right breast needle localized partial mastectomy and removal of nipple    TOTAL HIP ARTHROPLASTY Left 07/17/2016   Procedure: LEFT TOTAL HIP ARTHROPLASTY ANTERIOR APPROACH;  Surgeon: Gaynelle Arabian, MD;  Location: [facility];  Service: Orthopedics;  Laterality: Left;   TOTAL HIP ARTHROPLASTY Right 11/20/2016   Procedure: RIGHT TOTAL HIP ARTHROPLASTY ANTERIOR APPROACH;  Surgeon: Gaynelle Arabian, MD;  Location: [facility];  Service: Orthopedics;  Laterality: Right;   TUBAL LIGATION  1972   Allergies  Allergen Reactions   Xarelto [Rivaroxaban]    Prior to Admission medications   Medication Sig Start Date End Date Taking? Authorizing Provider  amLODipine (NORVASC) 5 MG tablet Take 1 tablet (5 mg total) by mouth daily. 10/21/17  Yes Wendie Agreste, MD  chlorthalidone (HYGROTON) 25 MG tablet TAKE 1 TABLET(25 MG) BY MOUTH DAILY 10/21/17  Yes Wendie Agreste, MD  levothyroxine (SYNTHROID, LEVOTHROID) 50 MCG tablet Take 1 tablet (50 mcg total) by mouth daily before breakfast. 10/21/17  Yes Wendie Agreste, MD  simvastatin (ZOCOR) 20 MG tablet TAKE 1 TABLET(20 MG) BY MOUTH DAILY 02/10/18  Yes Wendie Agreste, MD   Social History   Socioeconomic History   Marital status: Married    Spouse name: Not on file   Number of children: 3   Years of education: 12   Highest education level: Not on file  Occupational History   Occupation: Waitress    Comment: Conservation officer, historic buildings strain: Not on file   Food insecurity:    Worry: Not on file    Inability: Not on file   Transportation needs:    Medical: Not on file    Non-medical: Not on file  Tobacco Use   Smoking status: Never Smoker   Smokeless tobacco: Never Used  Substance and Sexual Activity   Alcohol use: No    Alcohol/week: 0.0 standard drinks    Comment: Rarely/On Special Occasions   Drug use: No   Sexual activity: Not on file    Comment: G3, P3, MENARCHE, AGE 28, MENOPAUSE AGE  68, NO BC AND HRT X 10 YEARS  Lifestyle   Physical activity:    Days per week: Not on file    Minutes per session: Not on file   Stress: Not on file  Relationships   Social connections:    Talks on phone: Not on file    Gets together: Not on file    Attends religious service: Not on file    Active member of club or organization: Not on file    Attends meetings of clubs or organizations: Not on file    Relationship status: Not on file   Intimate partner violence:    Fear of current or ex partner: Not on file    Emotionally abused: Not on file    Physically abused: Not on file    Forced sexual activity: Not on file  Other Topics Concern   Not on file  Social History  Narrative   Married for 56 years      3 children, one deceased   6 grand-children, 3 great-grandchildren     Observations/Objective: No distress, normal responses.   Assessment and Plan: Hypothyroidism, unspecified type - Plan: levothyroxine (SYNTHROID) 50 MCG tablet, TSH  -Stable previously, no new symptoms.  Given current pandemic will delay lab work for 2 to 3 months.  Continue Synthroid same dose  Essential hypertension - Plan: Comprehensive metabolic panel, chlorthalidone (HYGROTON) 25 MG tablet, amLODipine (NORVASC) 5 MG tablet  -Borderline at home, ranges discussed if she can monitor from home, and repeat visit if elevated readings.  Tolerating current regimen.  Plan on labs next 2 to 3 months.  Hypercholesteremia - Plan: Comprehensive metabolic panel, Lipid panel, simvastatin (ZOCOR) 20 MG tablet  -Tolerated 40 mg dosing, can try that again or alternating 20/40 mg daily.  Meds refilled, plan on labs in 2 to 3 months.  Follow Up Instructions:  2-3 mo for labs, 74mo appt.   Patient Instructions    Keep a record of your blood pressures outside of the office and if over 150/90 let me know. Continue amlodipine same dose for now.   You can try 2 simvastatin each day. Let me know if that is tolerated,  and I can change the dose at the pharmacy Fasting labwork in next 2-3 months as restrictions lighten with current pandemic.  appointment with me in 6 months.   No change in thyroid meds for now.   Take care.    If you have lab work done today you will be contacted with your lab results within the next 2 weeks.  If you have not heard from [place] then please contact us. The fastest way to get your results is to register for My Chart.   IF you received an x-ray today, you will receive an invoice from [facility]. Please contact [facility] at [phone] with questions or concerns regarding your invoice.   IF you received labwork today, you will receive an invoice from [facility]. Please contact [facility] at [phone] with questions or concerns regarding your invoice.   Our billing staff will not be able to assist you with questions regarding bills from these companies.  You will be contacted with the lab results as soon as they are available. The fastest way to get your results is to activate your My Chart account. Instructions are located on the last page of this paperwork. If you have not heard from [place] regarding the results in 2 weeks, please contact this office.          I discussed the assessment and treatment plan with the patient. The patient was provided an opportunity to ask questions and all were answered. The patient agreed with the plan and demonstrated an understanding of the instructions.   The patient was advised to call back or seek an in-person evaluation if the symptoms worsen or if the condition fails to improve as anticipated.  I provided 8 minutes of non-face-to-face time during this encounter.  Signed,   [name], MD [facility] [place].  06/16/18

## 2018-06-16 NOTE — Patient Instructions (Addendum)
  Keep a record of your blood pressures outside of the office and if over 150/90 let me know. Continue amlodipine same dose for now.   You can try 2 simvastatin each day. Let me know if that is tolerated, and I can change the dose at the pharmacy Fasting labwork in next 2-3 months as restrictions lighten with current pandemic.  appointment with me in 6 months.   No change in thyroid meds for now.   Take care.    If you have lab work done today you will be contacted with your lab results within the next 2 weeks.  If you have not heard from [place] then please contact us. The fastest way to get your results is to register for My Chart.   IF you received an x-ray today, you will receive an invoice from [facility]. Please contact [facility] at [phone] with questions or concerns regarding your invoice.   IF you received labwork today, you will receive an invoice from [facility]. Please contact [facility] at [phone] with questions or concerns regarding your invoice.   Our billing staff will not be able to assist you with questions regarding bills from these companies.  You will be contacted with the lab results as soon as they are available. The fastest way to get your results is to activate your My Chart account. Instructions are located on the last page of this paperwork. If you have not heard from [place] regarding the results in 2 weeks, please contact this office.

## 2018-06-16 NOTE — Progress Notes (Signed)
CC-High blood pressure and thyroid- Patient have not been taking blood pressure at home. She said she feels fine and not having any problems. Need a refill on levothyroxine 50 mg. She stated she getting outside and doing well not having any problems.

## 2019-03-01 ENCOUNTER — Other Ambulatory Visit: Payer: Self-pay | Admitting: Family Medicine

## 2019-03-01 DIAGNOSIS — I10 Essential (primary) hypertension: Secondary | ICD-10-CM

## 2019-03-01 NOTE — Telephone Encounter (Signed)
30 day courtesy refill- pt needs an appointment for further refills. Requested Prescriptions  Pending Prescriptions Disp Refills  . chlorthalidone (HYGROTON) 25 MG tablet [Pharmacy Med Name: CHLORTHALIDONE 25MG  TABLETS] 30 tablet 0    Sig: TAKE 1 TABLET(25 MG) BY MOUTH DAILY     Cardiovascular: Diuretics - Thiazide Failed - 03/01/2019  8:40 AM      Failed - Ca in normal range and within 360 days    Calcium  Date Value Ref Range Status  10/21/2017 9.6 8.7 - 10.3 mg/dL Final  04/16/2016 10.0 8.4 - 10.4 mg/dL Final         Failed - Cr in normal range and within 360 days    Creatinine  Date Value Ref Range Status  04/16/2016 0.9 0.6 - 1.1 mg/dL Final   Creatinine, Ser  Date Value Ref Range Status  10/21/2017 0.85 0.57 - 1.00 mg/dL Final         Failed - K in normal range and within 360 days    Potassium  Date Value Ref Range Status  10/21/2017 3.5 3.5 - 5.2 mmol/L Final  04/16/2016 3.7 3.5 - 5.1 mEq/L Final         Failed - Na in normal range and within 360 days    Sodium  Date Value Ref Range Status  10/21/2017 140 134 - 144 mmol/L Final  04/16/2016 138 136 - 145 mEq/L Final         Failed - Last BP in normal range    BP Readings from Last 1 Encounters:  10/21/17 (!) 142/70         Failed - Valid encounter within last 6 months    Recent Outpatient Visits          8 months ago Hypothyroidism, unspecified type   [facility], Ranell Patrick, MD   1 year ago Sore throat   [facility], Ranell Patrick, MD   1 year ago Left leg swelling   [facility], Ranell Patrick, MD   2 years ago Hypothyroidism, unspecified type   [facility], Ranell Patrick, MD   2 years ago Hypertension, unspecified type   [facility], Ranell Patrick, MD

## 2019-05-22 ENCOUNTER — Other Ambulatory Visit: Payer: Self-pay | Admitting: Family Medicine

## 2019-05-22 DIAGNOSIS — I10 Essential (primary) hypertension: Secondary | ICD-10-CM

## 2019-05-22 NOTE — Telephone Encounter (Signed)
Requested medication (s) are due for refill today: yes  Requested medication (s) are on the active medication list: yes  Last refill:  06/16/18  Future visit scheduled: no  Notes to clinic:  5 months overdue for OV   Requested Prescriptions  Pending Prescriptions Disp Refills   amLODipine (NORVASC) 5 MG tablet [Pharmacy Med Name: AMLODIPINE BESYLATE 5MG  TABLETS] 90 tablet 2    Sig: TAKE 1 TABLET(5 MG) BY MOUTH DAILY      Cardiovascular:  Calcium Channel Blockers Failed - 05/22/2019  8:37 AM      Failed - Last BP in normal range    BP Readings from Last 1 Encounters:  10/21/17 (!) 142/70          Failed - Valid encounter within last 6 months    Recent Outpatient Visits           11 months ago Hypothyroidism, unspecified type   [facility], Ranell Patrick, MD   1 year ago Sore throat   [facility], Ranell Patrick, MD   2 years ago Left leg swelling   [facility], Ranell Patrick, MD   2 years ago Hypothyroidism, unspecified type   [facility], Ranell Patrick, MD   2 years ago Hypertension, unspecified type   [facility], Ranell Patrick, MD

## 2019-05-24 NOTE — Telephone Encounter (Signed)
Please assist in scheduling appt for pt.   [place] refill sent

## 2019-05-31 NOTE — Telephone Encounter (Signed)
Called pt and [email or web address].

## 2019-06-16 ENCOUNTER — Ambulatory Visit: Payer: Medicare Other | Admitting: Family Medicine

## 2019-06-18 ENCOUNTER — Encounter: Payer: Self-pay | Admitting: Family Medicine

## 2019-06-28 ENCOUNTER — Encounter: Payer: Self-pay | Admitting: Family Medicine

## 2019-06-28 ENCOUNTER — Other Ambulatory Visit: Payer: Self-pay

## 2019-06-28 ENCOUNTER — Ambulatory Visit (INDEPENDENT_AMBULATORY_CARE_PROVIDER_SITE_OTHER): Payer: Medicare Other | Admitting: Family Medicine

## 2019-06-28 VITALS — BP 160/83 | HR 75 | Temp 97.9°F | Ht 64.0 in | Wt 164.0 lb

## 2019-06-28 DIAGNOSIS — E039 Hypothyroidism, unspecified: Secondary | ICD-10-CM | POA: Diagnosis not present

## 2019-06-28 DIAGNOSIS — R0981 Nasal congestion: Secondary | ICD-10-CM

## 2019-06-28 DIAGNOSIS — E7800 Pure hypercholesterolemia, unspecified: Secondary | ICD-10-CM

## 2019-06-28 DIAGNOSIS — R21 Rash and other nonspecific skin eruption: Secondary | ICD-10-CM | POA: Diagnosis not present

## 2019-06-28 DIAGNOSIS — I10 Essential (primary) hypertension: Secondary | ICD-10-CM

## 2019-06-28 MED ORDER — CHLORTHALIDONE 25 MG PO TABS: 25.0000 mg | ORAL_TABLET | Freq: Every day | ORAL | 1 refills | Status: DC

## 2019-06-28 MED ORDER — AMLODIPINE BESYLATE 10 MG PO TABS: 10.0000 mg | ORAL_TABLET | Freq: Every day | ORAL | 1 refills | Status: DC

## 2019-06-28 MED ORDER — FLUTICASONE PROPIONATE 50 MCG/ACT NA SUSP: 1.0000 | Freq: Every day | NASAL | 6 refills | Status: DC

## 2019-06-28 MED ORDER — METRONIDAZOLE 1 % EX GEL: Freq: Every day | CUTANEOUS | 1 refills | Status: DC

## 2019-06-28 MED ORDER — SIMVASTATIN 20 MG PO TABS: 20.0000 mg | ORAL_TABLET | Freq: Every day | ORAL | 1 refills | Status: DC

## 2019-06-28 MED ORDER — LEVOTHYROXINE SODIUM 50 MCG PO TABS: 50.0000 ug | ORAL_TABLET | Freq: Every day | ORAL | 3 refills | Status: DC

## 2019-06-28 NOTE — Patient Instructions (Addendum)
Try metronidazole gel once per day for face rash.  See information below on rosacea.  If that is not helping in the next month, I would recommend follow-up with dermatology.  Stop that medication if worse symptoms.  Try fluticasone nasal spray for sinus congestion.  Use the technique we discussed using the opposite Bernhard and keeping her head forward, not leaning back, do not sniff afterwards.  Blood pressure is too high.  I have increased her amlodipine to 10 mg/day.  Continue chlorthalidone same dose.  Watch for any lightheadedness or dizziness with higher dose of medication and if that occurs, be seen right away.  Simvastatin was reordered, can restart that medication but I am checking some blood work today.  Return to the clinic or go to the nearest emergency room if any of your symptoms worsen or new symptoms occur.   Rosacea Rosacea is a long-term (chronic) condition that affects the skin of the face, including the cheeks, nose, forehead, and chin. This condition can also affect the eyes. Rosacea causes blood vessels near the surface of the skin to enlarge, which results in redness. What are the causes? The cause of this condition is not known. Certain triggers can make rosacea worse, including:  Hot baths.  Exercise.  Sunlight.  Very hot or cold temperatures.  Hot or spicy foods and drinks.  Drinking alcohol.  Stress.  Taking blood pressure medicine.  Long-term use of topical steroids on the face. What increases the risk? You are more likely to develop this condition if you:  Are older than 83 years of age.  Are a woman.  Have light-colored skin (light complexion).  Have a family history of rosacea. What are the signs or symptoms? Symptoms of this condition include:  Redness of the face.  Red bumps or pimples on the face.  A red, enlarged nose.  Blushing easily.  Red lines on the skin.  Irritated, burning, or itchy feeling in the eyes.  Swollen  eyelids.  Drainage from the eyes.  Feeling like there is something in your eye. How is this diagnosed? This condition is diagnosed with a medical history and physical exam. How is this treated? There is no cure for this condition, but treatment can help to control your symptoms. Your health care provider may recommend that you see a skin specialist (dermatologist). Treatment may include:  Medicines that are applied to the skin or taken by mouth (orally). This can include antibiotic medicines.  Laser treatment to improve the appearance of the skin.  Surgery. This is rare. Your health care provider will also recommend the best way to take care of your skin. Even after your skin improves, you will likely need to continue treatment to prevent your rosacea from coming back. Follow these instructions at home: Skin care Take care of your skin as told by your health care provider. You may be told to do these things:  Wash your skin gently two or more times each day.  Use mild soap.  Use a sunscreen or sunblock with SPF 30 or greater.  Use gentle cosmetics that are meant for sensitive skin.  Shave with an electric shaver instead of a blade. Lifestyle  Try to keep track of what foods trigger this condition. Avoid any triggers. These may include: ? Spicy foods. ? Seafood. ? Cheese. ? Hot liquids. ? Nuts. ? Chocolate. ? Iodized salt.  Do not drink alcohol.  Avoid extremely cold or hot temperatures.  Try to reduce your stress. If you  need help, talk with your health care provider.  When you exercise, do these things to stay cool: ? Limit sun exposure to your face. ? Use a fan. ? Do shorter and more frequent intervals of exercise. General instructions  Take and apply over-the-counter and prescription medicines only as told by your health care provider.  If you were prescribed an antibiotic medicine, apply it or take it as told by your health care provider. Do not stop using  the antibiotic even if your condition improves.  If your eyelids are affected, apply warm compresses to them. Do this as told by your health care provider.  Keep all follow-up visits as told by your health care provider. This is important. Contact a health care provider if:  Your symptoms get worse.  Your symptoms do not improve after 2 months of treatment.  You have new symptoms.  You have any changes in vision or you have problems with your eyes, such as redness or itching.  You feel depressed.  You lose your appetite.  You have trouble concentrating. Summary  Rosacea is a long-term (chronic) condition that affects the skin of the face, including the cheeks, nose, forehead, and chin.  Take care of your skin as told by your health care provider.  Take and apply over-the-counter and prescription medicines only as told by your health care provider.  Contact a health care provider if your symptoms get worse or if you have any changes in vision or other problems with your eyes, such as redness or itching.  Keep all follow-up visits as told by your health care provider. This is important. This information is not intended to replace advice given to you by your health care provider. Make sure you discuss any questions you have with your health care provider. Document Revised: 07/16/2017 Document Reviewed: 07/16/2017 Elsevier Patient Education  [employer].    If you have lab work done today you will be contacted with your lab results within the next 2 weeks.  If you have not heard from [place] then please contact us. The fastest way to get your results is to register for My Chart.   IF you received an x-ray today, you will receive an invoice from [facility]. Please contact [facility] at [phone] with questions or concerns regarding your invoice.   IF you received labwork today, you will receive an invoice from [facility]. Please contact [facility] at  [phone] with questions or concerns regarding your invoice.   Our billing staff will not be able to assist you with questions regarding bills from these companies.  You will be contacted with the lab results as soon as they are available. The fastest way to get your results is to activate your My Chart account. Instructions are located on the last page of this paperwork. If you have not heard from [place] regarding the results in 2 weeks, please contact this office.

## 2019-06-28 NOTE — Progress Notes (Signed)
Subjective:  Patient ID: Molly Lambert, female    DOB: 1935/01/30  Age: 85 y.o. MRN: [id number]  CC:  Chief Complaint  Patient presents with  . Hypothyroidism    pt hasn't had any pysical symptoms of this condition. medication works well with no side effects.  . Hyperlipidemia    pt hasn't has any physical symptoms of this condition medication works well with no side effects.  . Hypertension    pt reports he BP isn't consistant sometimes its high sometimes its low. pt checks her BP every other day sometimes once a week. pt states she gets light headed every once in a while, but no other physical symptoms.  medication works well with no side effects.  . Rash    pt has a Rash on her face around her noes. rash looks read with no welps pt states it is itchy sometimes.    HPI Molly Lambert presents for   Rash of face: Comes and goes since 2017. Itches. Past 1 month current flair.  Tx: otc anti itch cream - burns at times. Steroid cream helps. Dove soap and water.  Saw derm about 5 yrs ago - unknown cause. Eczema vs rosacea - not sure if metronidazole.  Working in sun, spicy foods may make it worse.  No alcohol.  Some chronic runny nose. Uses allergy nasal drops.  Plans on getting covid vaccine soon.   Hypothyroidism: Lab Results  Component Value Date   TSH 0.529 10/21/2017  synthroid 69mcg Qd.  Taking medication daily.  No new hot or cold intolerance. No new hair or skin changes, heart palpitations or new fatigue. No new weight changes.   Hypertension: norvasc 5mg , chlorthalidone 25 qd. No leg swelling.  No missed doses usually, no missed doses recently.  Home readings:160/80.  BP Readings from Last 3 Encounters:  06/28/19 (!) 160/83  10/21/17 (!) 142/70  04/24/17 138/70   Lab Results  Component Value Date   CREATININE 0.85 10/21/2017    Hyperlipidemia: zocor 20mg  qd - thinks she stopped it - not sure why. Off past few months. No known side effects.  Lab Results    Component Value Date   CHOL 223 (H) 10/21/2017   HDL 71 10/21/2017   LDLCALC 125 (H) 10/21/2017   LDLDIRECT 131 (H) 08/22/2011   TRIG 134 10/21/2017   CHOLHDL 3.1 10/21/2017   Lab Results  Component Value Date   ALT 13 10/21/2017   AST 14 10/21/2017   ALKPHOS 92 10/21/2017   BILITOT 0.4 10/21/2017        History Patient Active Problem List   Diagnosis Date Noted  . OA (osteoarthritis) of hip 07/17/2016  . Breast microcalcifications 12/27/2011  . History of breast cancer 12/27/2011  . Hypothyroidism 08/25/2011  . Hypercholesteremia 08/25/2011  . HTN (hypertension) 08/25/2011  . Malignant neoplasm of central portion of right breast in female, estrogen receptor positive ([place]) 02/27/2011  . Breast mass, right 11/28/2010   Past Medical History:  Diagnosis Date  . Arthritis   . Breast cancer ([place]) 11/2010   Rt breast  . Cataract   . Erythema    reddened area of epidermis spnning concentrated around sinuses on the face; pt reports cracking, itchiness, and flaking  of affected skin; seen derm no absolute dx, will get 2nd opinion   . Hypercholesteremia   . Hypertension    NO CARDIAC MD,  [facility]  PCP  ,  URGENT MED  . Hypothyroidism   . Leg laceration  WITH SURGERY  . Paget's carcinoma of the nipple, right ([facility])    nipple removed  . Personal history of radiation therapy   . S/P radiation therapy 03/14/11 - 04/05/11   Right Breast / 4256 cGy in 16 Fractions  . Thyroid disease    Past Surgical History:  Procedure Laterality Date  . BREAST BIOPSY    . BREAST LUMPECTOMY  01/22/11   RIGHT BREAST LUMPECTOMY WITH BIOSPY OF 1 SENTINEL NODE, INVASIVE GRADE II DUCTAL CARCINOMA , INTERMEDIATE GRADE DUCTAL CARCINOMA IN SITU , DERMAL SKIN INVOLVED, MARGINS NEGATIVE,( 0/1)  NODE POSITIVE, ER+, PR+, LOW s-PHASE, HER 2 NEU- NO AMPLIFICATION  . BREAST SURGERY  11/28/10   SKIN-PUNCH BIOSPY RIGHT BREAST(NIPPLE), ER+, RP+, LOW S-PHASE, HER 2NEU NEGAITIVE  . EYE SURGERY       BILATERAL CATARACT SURGERY  . JOINT REPLACEMENT     left hip  . PARTIAL MASTECTOMY WITH NEEDLE LOCALIZATION  01/10/2012   Procedure: PARTIAL MASTECTOMY WITH NEEDLE LOCALIZATION;  Surgeon: Marcello Moores A. Cornett, MD;  Location: [facility];  Service: General;  Laterality: Right;  right breast needle localized partial mastectomy and removal of nipple   . TOTAL HIP ARTHROPLASTY Left 07/17/2016   Procedure: LEFT TOTAL HIP ARTHROPLASTY ANTERIOR APPROACH;  Surgeon: Gaynelle Arabian, MD;  Location: [facility];  Service: Orthopedics;  Laterality: Left;  . TOTAL HIP ARTHROPLASTY Right 11/20/2016   Procedure: RIGHT TOTAL HIP ARTHROPLASTY ANTERIOR APPROACH;  Surgeon: Gaynelle Arabian, MD;  Location: [facility];  Service: Orthopedics;  Laterality: Right;  . TUBAL LIGATION  1972   Allergies  Allergen Reactions  . Xarelto [Rivaroxaban]    Prior to Admission medications   Medication Sig Start Date End Date Taking? Authorizing Provider  amLODipine (NORVASC) 5 MG tablet TAKE 1 TABLET(5 MG) BY MOUTH DAILY 05/24/19  Yes Wendie Agreste, MD  chlorthalidone (HYGROTON) 25 MG tablet TAKE 1 TABLET(25 MG) BY MOUTH DAILY 03/01/19  Yes Wendie Agreste, MD  levothyroxine (SYNTHROID) 50 MCG tablet Take 1 tablet (50 mcg total) by mouth daily before breakfast. 06/16/18  Yes Wendie Agreste, MD  simvastatin (ZOCOR) 20 MG tablet Take 1-2 tablets (20-40 mg total) by mouth daily at 6 PM. 06/16/18  Yes Wendie Agreste, MD   Social History   Socioeconomic History  . Marital status: Married    Spouse name: Not on file  . Number of children: 3  . Years of education: 34  . Highest education level: Not on file  Occupational History  . Occupation: Waitress    Comment: Secretary/administrator  Tobacco Use  . Smoking status: Never Smoker  . Smokeless tobacco: Never Used  Substance and Sexual Activity  . Alcohol use: No    Alcohol/week: 0.0 standard drinks    Comment: Rarely/On Special Occasions  . Drug use: No  . Sexual  activity: Not on file    Comment: G3, P3, MENARCHE, AGE 70, MENOPAUSE AGE 67, NO BC AND HRT X 10 YEARS  Other Topics Concern  . Not on file  Social History Narrative   Married for 56 years      3 children, one deceased   73 grand-children, 3 great-grandchildren   Social Determinants of Health   Financial Resource Strain:   . Difficulty of Paying Living Expenses:   Food Insecurity:   . Worried About Charity fundraiser in the Last Year:   . Arboriculturist in the Last Year:   Transportation Needs:   . Lack  of Transportation (Medical):   Marland Kitchen Lack of Transportation (Non-Medical):   Physical Activity:   . Days of Exercise per Week:   . Minutes of Exercise per Session:   Stress:   . Feeling of Stress :   Social Connections:   . Frequency of Communication with Friends and Family:   . Frequency of Social Gatherings with Friends and Family:   . Attends Religious Services:   . Active Member of Clubs or Organizations:   . Attends Archivist Meetings:   Marland Kitchen Marital Status:   Intimate Partner Violence:   . Fear of Current or Ex-Partner:   . Emotionally Abused:   Marland Kitchen Physically Abused:   . Sexually Abused:     Review of Systems  Constitutional: Negative for fatigue and unexpected weight change.  Respiratory: Negative for chest tightness and shortness of breath.   Cardiovascular: Negative for chest pain, palpitations and leg swelling.  Gastrointestinal: Negative for abdominal pain and blood in stool.  Neurological: Negative for dizziness, syncope, light-headedness and headaches.     Objective:   Vitals:   06/28/19 1132  BP: (!) 160/83  Pulse: 75  Temp: 97.9 F (36.6 C)  TempSrc: Temporal  SpO2: 96%  Weight: 164 lb (74.4 kg)  Height: 5\' 4"  (1.626 m)     Physical Exam Vitals reviewed.  Constitutional:      Appearance: She is well-developed.  HENT:     Head: Normocephalic and atraumatic.  Eyes:     Conjunctiva/sclera: Conjunctivae normal.     Pupils: Pupils  are equal, round, and reactive to light.  Neck:     Vascular: No carotid bruit.  Cardiovascular:     Rate and Rhythm: Normal rate and regular rhythm.     Heart sounds: Normal heart sounds.  Pulmonary:     Effort: Pulmonary effort is normal.     Breath sounds: Normal breath sounds.  Abdominal:     Palpations: Abdomen is soft. There is no pulsatile mass.     Tenderness: There is no abdominal tenderness.  Musculoskeletal:     Cervical back: Neck supple.  Skin:    General: Skin is warm and dry.     Findings: Rash (face - erythema, possible few small telangiectasias at left lateral nose. ) present.  Neurological:     Mental Status: She is alert and oriented to person, place, and time.  Psychiatric:        Mood and Affect: Mood normal.        Behavior: Behavior normal.        Assessment & Plan:  VYSHNAVI CIFELLI is a 85 y.o. female . Essential hypertension - Plan: Lipid panel, amLODipine (NORVASC) 10 MG tablet, chlorthalidone (HYGROTON) 25 MG tablet  -Decreased control including on home readings.  Will try higher dose of amlodipine at 10 mg, orthostatic/hypotensive precautions discussed with RTC precautions.  Continue chlorthalidone same dose, labs pending  Hypercholesteremia - Plan: Comprehensive metabolic panel, simvastatin (ZOCOR) 20 MG tablet  -Off meds recently, not sure why.  Was tolerating previously.  Restart Zocor, baseline lipid panel obtained  Hypothyroidism, unspecified type - Plan: TSH, levothyroxine (SYNTHROID) 50 MCG tablet  -Clinically stable, check TSH, continue same dose Synthroid  Nasal congestion - Plan: fluticasone (FLONASE) 50 MCG/ACT nasal spray Rash of face - Plan: metroNIDAZOLE (METROGEL) 1 % gel  -Rash on face could be due to constant wiping from nasal congestion versus rosacea with associated worsening with sunlight, spicy foods.  Unsure if metronidazole helpful previously.  -Start  Flonase for nasal congestion with correct technique discussed for improved  tolerance.  -Trial of metronidazole gel, handout on rosacea and triggers  -Recheck 1 month  Meds ordered this encounter  Medications  . fluticasone (FLONASE) 50 MCG/ACT nasal spray    Sig: Place 1 spray into both nostrils daily.    Dispense:  16 g    Refill:  6  . metroNIDAZOLE (METROGEL) 1 % gel    Sig: Apply topically daily. To rash of face.    Dispense:  45 g    Refill:  1  . amLODipine (NORVASC) 10 MG tablet    Sig: Take 1 tablet (10 mg total) by mouth daily.    Dispense:  90 tablet    Refill:  1  . chlorthalidone (HYGROTON) 25 MG tablet    Sig: Take 1 tablet (25 mg total) by mouth daily.    Dispense:  90 tablet    Refill:  1  . simvastatin (ZOCOR) 20 MG tablet    Sig: Take 1-2 tablets (20-40 mg total) by mouth daily at 6 PM.    Dispense:  90 tablet    Refill:  1  . levothyroxine (SYNTHROID) 50 MCG tablet    Sig: Take 1 tablet (50 mcg total) by mouth daily before breakfast.    Dispense:  90 tablet    Refill:  3   Patient Instructions   Try metronidazole gel once per day for face rash.  See information below on rosacea.  If that is not helping in the next month, I would recommend follow-up with dermatology.  Stop that medication if worse symptoms.  Try fluticasone nasal spray for sinus congestion.  Use the technique we discussed using the opposite [place] and keeping her head forward, not leaning back, do not sniff afterwards.  Blood pressure is too high.  I have increased her amlodipine to 10 mg/day.  Continue chlorthalidone same dose.  Watch for any lightheadedness or dizziness with higher dose of medication and if that occurs, be seen right away.  Simvastatin was reordered, can restart that medication but I am checking some blood work today.  Return to the clinic or go to the nearest emergency room if any of your symptoms worsen or new symptoms occur.   Rosacea Rosacea is a long-term (chronic) condition that affects the skin of the face, including the cheeks, nose,  forehead, and chin. This condition can also affect the eyes. Rosacea causes blood vessels near the surface of the skin to enlarge, which results in redness. What are the causes? The cause of this condition is not known. Certain triggers can make rosacea worse, including:  Hot baths.  Exercise.  Sunlight.  Very hot or cold temperatures.  Hot or spicy foods and drinks.  Drinking alcohol.  Stress.  Taking blood pressure medicine.  Long-term use of topical steroids on the face. What increases the risk? You are more likely to develop this condition if you:  Are older than 83 years of age.  Are a woman.  Have light-colored skin (light complexion).  Have a family history of rosacea. What are the signs or symptoms? Symptoms of this condition include:  Redness of the face.  Red bumps or pimples on the face.  A red, enlarged nose.  Blushing easily.  Red lines on the skin.  Irritated, burning, or itchy feeling in the eyes.  Swollen eyelids.  Drainage from the eyes.  Feeling like there is something in your eye. How is this diagnosed? This condition is  diagnosed with a medical history and physical exam. How is this treated? There is no cure for this condition, but treatment can help to control your symptoms. Your health care provider may recommend that you see a skin specialist (dermatologist). Treatment may include:  Medicines that are applied to the skin or taken by mouth (orally). This can include antibiotic medicines.  Laser treatment to improve the appearance of the skin.  Surgery. This is rare. Your health care provider will also recommend the best way to take care of your skin. Even after your skin improves, you will likely need to continue treatment to prevent your rosacea from coming back. Follow these instructions at home: Skin care Take care of your skin as told by your health care provider. You may be told to do these things:  Wash your skin gently two  or more times each day.  Use mild soap.  Use a sunscreen or sunblock with SPF 30 or greater.  Use gentle cosmetics that are meant for sensitive skin.  Shave with an electric shaver instead of a blade. Lifestyle  Try to keep track of what foods trigger this condition. Avoid any triggers. These may include: ? Spicy foods. ? Seafood. ? Cheese. ? Hot liquids. ? Nuts. ? Chocolate. ? Iodized salt.  Do not drink alcohol.  Avoid extremely cold or hot temperatures.  Try to reduce your stress. If you need help, talk with your health care provider.  When you exercise, do these things to stay cool: ? Limit sun exposure to your face. ? Use a fan. ? Do shorter and more frequent intervals of exercise. General instructions  Take and apply over-the-counter and prescription medicines only as told by your health care provider.  If you were prescribed an antibiotic medicine, apply it or take it as told by your health care provider. Do not stop using the antibiotic even if your condition improves.  If your eyelids are affected, apply warm compresses to them. Do this as told by your health care provider.  Keep all follow-up visits as told by your health care provider. This is important. Contact a health care provider if:  Your symptoms get worse.  Your symptoms do not improve after 2 months of treatment.  You have new symptoms.  You have any changes in vision or you have problems with your eyes, such as redness or itching.  You feel depressed.  You lose your appetite.  You have trouble concentrating. Summary  Rosacea is a long-term (chronic) condition that affects the skin of the face, including the cheeks, nose, forehead, and chin.  Take care of your skin as told by your health care provider.  Take and apply over-the-counter and prescription medicines only as told by your health care provider.  Contact a health care provider if your symptoms get worse or if you have any  changes in vision or other problems with your eyes, such as redness or itching.  Keep all follow-up visits as told by your health care provider. This is important. This information is not intended to replace advice given to you by your health care provider. Make sure you discuss any questions you have with your health care provider. Document Revised: 07/16/2017 Document Reviewed: 07/16/2017 Elsevier Patient Education  [employer].    If you have lab work done today you will be contacted with your lab results within the next 2 weeks.  If you have not heard from [place] then please contact us. The fastest way to  get your results is to register for My Chart.   IF you received an x-ray today, you will receive an invoice from [facility]. Please contact [facility] at [phone] with questions or concerns regarding your invoice.   IF you received labwork today, you will receive an invoice from [facility]. Please contact [facility] at [phone] with questions or concerns regarding your invoice.   Our billing staff will not be able to assist you with questions regarding bills from these companies.  You will be contacted with the lab results as soon as they are available. The fastest way to get your results is to activate your My Chart account. Instructions are located on the last page of this paperwork. If you have not heard from [place] regarding the results in 2 weeks, please contact this office.         Signed, [name], MD [facility]

## 2019-06-29 LAB — LIPID PANEL
Chol/HDL Ratio: 4.5 ratio — ABNORMAL HIGH (ref 0.0–4.4)
Cholesterol, Total: 287 mg/dL — ABNORMAL HIGH (ref 100–199)
HDL: 64 mg/dL (ref >39)
LDL Chol Calc (NIH): 187 mg/dL — ABNORMAL HIGH (ref 0–99)
Triglycerides: 193 mg/dL — ABNORMAL HIGH (ref 0–149)
VLDL Cholesterol Cal: 36 mg/dL (ref 5–40)

## 2019-06-29 LAB — COMPREHENSIVE METABOLIC PANEL
ALT: 16 IU/L (ref 0–32)
AST: 15 IU/L (ref 0–40)
Albumin/Globulin Ratio: 1.6 (ref 1.2–2.2)
Albumin: 4.3 g/dL (ref 3.6–4.6)
Alkaline Phosphatase: 92 IU/L (ref 39–117)
BUN/Creatinine Ratio: 26 (ref 12–28)
BUN: 21 mg/dL (ref 8–27)
Bilirubin Total: 0.3 mg/dL (ref 0.0–1.2)
CO2: 23 mmol/L (ref 20–29)
Calcium: 10 mg/dL (ref 8.7–10.3)
Chloride: 99 mmol/L (ref 96–106)
Creatinine, Ser: 0.81 mg/dL (ref 0.57–1.00)
GFR calc Af Amer: 78 mL/min/{1.73_m2} (ref >59)
GFR calc non Af Amer: 67 mL/min/{1.73_m2} (ref >59)
Globulin, Total: 2.7 g/dL (ref 1.5–4.5)
Glucose: 88 mg/dL (ref 65–99)
Potassium: 3.3 mmol/L — ABNORMAL LOW (ref 3.5–5.2)
Sodium: 138 mmol/L (ref 134–144)
Total Protein: 7 g/dL (ref 6.0–8.5)

## 2019-06-29 LAB — TSH: TSH: 1.08 u[IU]/mL (ref 0.450–4.500)

## 2019-07-13 NOTE — Progress Notes (Unsigned)
Letter SENT   Unable to reach patient. Unable to leave voicemail.

## 2019-07-20 ENCOUNTER — Ambulatory Visit: Payer: Medicare Other | Admitting: Registered Nurse

## 2019-07-20 VITALS — BP 165/92 | HR 72 | Ht 64.0 in | Wt 164.0 lb

## 2019-07-20 DIAGNOSIS — Z0000 Encounter for general adult medical examination without abnormal findings: Secondary | ICD-10-CM

## 2019-07-20 NOTE — Patient Instructions (Signed)
Thank you for taking time to come for your Medicare Wellness Visit. I appreciate your ongoing commitment to your health goals. Please review the following plan we discussed and let me know if I can assist you in the future.  Sylvia Kondracki LPN  Preventive Care 65 Years and Older, Female Preventive care refers to lifestyle choices and visits with your health care provider that can promote health and wellness. This includes:  A yearly physical exam. This is also called an annual well check.  Regular dental and eye exams.  Immunizations.  Screening for certain conditions.  Healthy lifestyle choices, such as diet and exercise. What can I expect for my preventive care visit? Physical exam Your health care provider will check:  Height and weight. These may be used to calculate body mass index (BMI), which is a measurement that tells if you are at a healthy weight.  Heart rate and blood pressure.  Your skin for abnormal spots. Counseling Your health care provider may ask you questions about:  Alcohol, tobacco, and drug use.  Emotional well-being.  Home and relationship well-being.  Sexual activity.  Eating habits.  History of falls.  Memory and ability to understand (cognition).  Work and work environment.  Pregnancy and menstrual history. What immunizations do I need?  Influenza (flu) vaccine  This is recommended every year. Tetanus, diphtheria, and pertussis (Tdap) vaccine  You may need a Td booster every 10 years. Varicella (chickenpox) vaccine  You may need this vaccine if you have not already been vaccinated. Zoster (shingles) vaccine  You may need this after age 60. Pneumococcal conjugate (PCV13) vaccine  One dose is recommended after age 83. Pneumococcal polysaccharide (PPSV23) vaccine  One dose is recommended after age 83. Measles, mumps, and rubella (MMR) vaccine  You may need at least one dose of MMR if you were born in 1957 or later. You may also  need a second dose. Meningococcal conjugate (MenACWY) vaccine  You may need this if you have certain conditions. Hepatitis A vaccine  You may need this if you have certain conditions or if you travel or work in places where you may be exposed to hepatitis A. Hepatitis B vaccine  You may need this if you have certain conditions or if you travel or work in places where you may be exposed to hepatitis B. Haemophilus influenzae type b (Hib) vaccine  You may need this if you have certain conditions. You may receive vaccines as individual doses or as more than one vaccine together in one shot (combination vaccines). Talk with your health care provider about the risks and benefits of combination vaccines. What tests do I need? Blood tests  Lipid and cholesterol levels. These may be checked every 5 years, or more frequently depending on your overall health.  Hepatitis C test.  Hepatitis B test. Screening  Lung cancer screening. You may have this screening every year starting at age 55 if you have a 30-pack-year history of smoking and currently smoke or have quit within the past 15 years.  Colorectal cancer screening. All adults should have this screening starting at age 50 and continuing until age 75. Your health care provider may recommend screening at age 45 if you are at increased risk. You will have tests every 1-10 years, depending on your results and the type of screening test.  Diabetes screening. This is done by checking your blood sugar (glucose) after you have not eaten for a while (fasting). You may have this done every 1-3   years.  Mammogram. This may be done every 1-2 years. Talk with your health care provider about how often you should have regular mammograms.  BRCA-related cancer screening. This may be done if you have a family history of breast, ovarian, tubal, or peritoneal cancers. Other tests  Sexually transmitted disease (STD) testing.  Bone density scan. This is done  to screen for osteoporosis. You may have this done starting at age 94. Follow these instructions at home: Eating and drinking  Eat a diet that includes fresh fruits and vegetables, whole grains, lean protein, and low-fat dairy products. Limit your intake of foods with high amounts of sugar, saturated fats, and salt.  Take vitamin and mineral supplements as recommended by your health care provider.  Do not drink alcohol if your health care provider tells you not to drink.  If you drink alcohol: ? Limit how much you have to 0-1 drink a day. ? Be aware of how much alcohol is in your drink. In the [place] one drink equals one 12 oz bottle of beer (355 mL), one 5 oz glass of wine (148 mL), or one 1 oz glass of hard liquor (44 mL). Lifestyle  Take daily care of your teeth and gums.  Stay active. Exercise for at least 30 minutes on 5 or more days each week.  Do not use any products that contain nicotine or tobacco, such as cigarettes, e-cigarettes, and chewing tobacco. If you need help quitting, ask your health care provider.  If you are sexually active, practice safe sex. Use a condom or other form of protection in order to prevent STIs (sexually transmitted infections).  Talk with your health care provider about taking a low-dose aspirin or statin. What's next?  Go to your health care provider once a year for a well check visit.  Ask your health care provider how often you should have your eyes and teeth checked.  Stay up to date on all vaccines. This information is not intended to replace advice given to you by your health care provider. Make sure you discuss any questions you have with your health care provider. Document Revised: 02/05/2018 Document Reviewed: 02/05/2018 Elsevier Patient Education  2020 [employer].

## 2019-07-20 NOTE — Progress Notes (Signed)
Presents today for [employer] Visit   Date of last exam: 06-28-2019  Interpreter used for this visit? No  I connected with  Molly Lambert on 07/20/19 by a telephone  and verified that I am speaking with the correct person using two identifiers.   I discussed the limitations of evaluation and management by telemedicine. The patient expressed understanding and agreed to proceed.    Patient Care Team: Wendie Agreste, MD as PCP - General (Family Medicine) Magrinat, Virgie Dad, MD as Consulting Physician (Oncology)   Other items to address today  Discussed Eye/Dental Discussed immunizations 2nd COVID vaccination scheduled 5-27  Follow up for Hypertension 6-4 @ 10:20 Dr. Carlota Raspberry     Other Screening: Last screening for diabetes: 06/28/2019 Last lipid screening: 06-28-2019  ADVANCE DIRECTIVES: Discussed: yes On File: no Materials Provided:  yes  Immunization status:  Immunization History  Administered Date(s) Administered  . PFIZER SARS-COV-2 Vaccination 06/25/2019  . Pneumococcal Conjugate-13 02/29/2016  . Pneumococcal Polysaccharide-23 08/23/2010  . Tdap 07/20/2009     Health Maintenance Due  Topic Date Due  . COVID-19 Vaccine (2 - Pfizer 2-dose series) 07/16/2019     Functional Status Survey: Is the patient deaf or have difficulty hearing?: No Does the patient have difficulty seeing, even when wearing glasses/contacts?: No Does the patient have difficulty concentrating, remembering, or making decisions?: No Does the patient have difficulty walking or climbing stairs?: No Does the patient have difficulty dressing or bathing?: No Does the patient have difficulty doing errands alone such as visiting a doctor's office or shopping?: No   6CIT Screen 07/20/2019  What Year? 0 points  What month? 0 points  What time? 0 points  Count back from 20 0 points  Months in reverse 0 points  Repeat phrase 0 points  Total Score 0        Clinical Support  from 07/20/2019 in [facility]  AUDIT-C Score  0       Home Environment:   No trouble climbing stairs Lives in one story home No scattered rugs Yes grab bars and a bath seat Adequate lighting / no clutter  Married for 56 years 3 children, one deceased 6 grand-children, 3 great-grandchildren   Patient Active Problem List   Diagnosis Date Noted  . OA (osteoarthritis) of hip 07/17/2016  . Breast microcalcifications 12/27/2011  . History of breast cancer 12/27/2011  . Hypothyroidism 08/25/2011  . Hypercholesteremia 08/25/2011  . HTN (hypertension) 08/25/2011  . Malignant neoplasm of central portion of right breast in female, estrogen receptor positive ([place]) 02/27/2011  . Breast mass, right 11/28/2010     Past Medical History:  Diagnosis Date  . Arthritis   . Breast cancer ([place]) 11/2010   Rt breast  . Cataract   . Erythema    reddened area of epidermis spnning concentrated around sinuses on the face; pt reports cracking, itchiness, and flaking  of affected skin; seen derm no absolute dx, will get 2nd opinion   . Hypercholesteremia   . Hypertension    NO CARDIAC MD,  [facility]  PCP  ,  URGENT MED  . Hypothyroidism   . Leg laceration    WITH SURGERY  . Paget's carcinoma of the nipple, right ([facility])    nipple removed  . Personal history of radiation therapy   . S/P radiation therapy 03/14/11 - 04/05/11   Right Breast / 4256 cGy in 16 Fractions  . Thyroid disease  Past Surgical History:  Procedure Laterality Date  . BREAST BIOPSY    . BREAST LUMPECTOMY  01/22/11   RIGHT BREAST LUMPECTOMY WITH BIOSPY OF 1 SENTINEL NODE, INVASIVE GRADE II DUCTAL CARCINOMA , INTERMEDIATE GRADE DUCTAL CARCINOMA IN SITU , DERMAL SKIN INVOLVED, MARGINS NEGATIVE,( 0/1)  NODE POSITIVE, ER+, PR+, LOW s-PHASE, HER 2 NEU- NO AMPLIFICATION  . BREAST SURGERY  11/28/10   SKIN-PUNCH BIOSPY RIGHT BREAST(NIPPLE), ER+, RP+, LOW S-PHASE, HER 2NEU NEGAITIVE  . EYE SURGERY      BILATERAL  CATARACT SURGERY  . JOINT REPLACEMENT     left hip  . PARTIAL MASTECTOMY WITH NEEDLE LOCALIZATION  01/10/2012   Procedure: PARTIAL MASTECTOMY WITH NEEDLE LOCALIZATION;  Surgeon: Marcello Moores A. Cornett, MD;  Location: [facility];  Service: General;  Laterality: Right;  right breast needle localized partial mastectomy and removal of nipple   . TOTAL HIP ARTHROPLASTY Left 07/17/2016   Procedure: LEFT TOTAL HIP ARTHROPLASTY ANTERIOR APPROACH;  Surgeon: Gaynelle Arabian, MD;  Location: [facility];  Service: Orthopedics;  Laterality: Left;  . TOTAL HIP ARTHROPLASTY Right 11/20/2016   Procedure: RIGHT TOTAL HIP ARTHROPLASTY ANTERIOR APPROACH;  Surgeon: Gaynelle Arabian, MD;  Location: [facility];  Service: Orthopedics;  Laterality: Right;  . TUBAL LIGATION  1972     Family History  Problem Relation Age of Onset  . Kidney disease Mother        kidney failure   . Kidney failure Mother   . Stroke Mother   . Heart disease Father        heart attack   . Heart attack Father   . Lymphoma Sister   . Pancreatitis Sister      Social History   Socioeconomic History  . Marital status: Married    Spouse name: Not on file  . Number of children: 3  . Years of education: 34  . Highest education level: Not on file  Occupational History  . Occupation: Waitress    Comment: Secretary/administrator  Tobacco Use  . Smoking status: Never Smoker  . Smokeless tobacco: Never Used  Substance and Sexual Activity  . Alcohol use: No    Alcohol/week: 0.0 standard drinks    Comment: Rarely/On Special Occasions  . Drug use: No  . Sexual activity: Not on file    Comment: G3, P3, MENARCHE, AGE 28, MENOPAUSE AGE 10, NO BC AND HRT X 10 YEARS  Other Topics Concern  . Not on file  Social History Narrative   Married for 56 years      3 children, one deceased   56 grand-children, 3 great-grandchildren   Social Determinants of Health   Financial Resource Strain:   . Difficulty of Paying Living Expenses:   Food  Insecurity:   . Worried About Charity fundraiser in the Last Year:   . Arboriculturist in the Last Year:   Transportation Needs:   . Film/video editor (Medical):   Marland Kitchen Lack of Transportation (Non-Medical):   Physical Activity:   . Days of Exercise per Week:   . Minutes of Exercise per Session:   Stress:   . Feeling of Stress :   Social Connections:   . Frequency of Communication with Friends and Family:   . Frequency of Social Gatherings with Friends and Family:   . Attends Religious Services:   . Active Member of Clubs or Organizations:   . Attends Archivist Meetings:   Marland Kitchen Marital Status:  Intimate Partner Violence:   . Fear of Current or Ex-Partner:   . Emotionally Abused:   Marland Kitchen Physically Abused:   . Sexually Abused:      Allergies  Allergen Reactions  . Xarelto [Rivaroxaban]      Prior to Admission medications   Medication Sig Start Date End Date Taking? Authorizing Provider  amLODipine (NORVASC) 10 MG tablet Take 1 tablet (10 mg total) by mouth daily. 06/28/19  Yes Wendie Agreste, MD  chlorthalidone (HYGROTON) 25 MG tablet Take 1 tablet (25 mg total) by mouth daily. 06/28/19  Yes Wendie Agreste, MD  levothyroxine (SYNTHROID) 50 MCG tablet Take 1 tablet (50 mcg total) by mouth daily before breakfast. 06/28/19  Yes Wendie Agreste, MD  metroNIDAZOLE (METROGEL) 1 % gel Apply topically daily. To rash of face. 06/28/19  Yes Wendie Agreste, MD  simvastatin (ZOCOR) 20 MG tablet Take 1-2 tablets (20-40 mg total) by mouth daily at 6 PM. 06/28/19  Yes Wendie Agreste, MD  fluticasone [facility]) 50 MCG/ACT nasal spray Place 1 spray into both nostrils daily. Patient not taking: Reported on 07/20/2019 06/28/19   Wendie Agreste, MD     Depression screen [facility] 2/9 07/20/2019 06/28/2019 06/16/2018 10/21/2017 04/24/2017  Decreased Interest 0 0 0 0 0  Down, Depressed, Hopeless 0 0 0 0 0  PHQ - 2 Score 0 0 0 0 0  Altered sleeping - - - - -  Tired, decreased energy - - - - -    Change in appetite - - - - -  Feeling bad or failure about yourself  - - - - -  Trouble concentrating - - - - -  Moving slowly or fidgety/restless - - - - -  Suicidal thoughts - - - - -  PHQ-9 Score - - - - -  Difficult doing work/chores - - - - -     Fall Risk  07/20/2019 06/28/2019 06/16/2018 10/21/2017 04/24/2017  Falls in the past year? 0 0 0 No No  Number falls in past yr: 0 - 0 - -  Injury with Fall? 0 - 0 - -  Follow up Falls evaluation completed;Education provided Falls evaluation completed - - -      PHYSICAL EXAM: BP (!) 165/92 Comment: taken from a previous visit  Pulse 72   Ht 5\' 4"  (1.626 m)   Wt 164 lb (74.4 kg)   BMI 28.15 kg/m    Wt Readings from Last 3 Encounters:  07/20/19 164 lb (74.4 kg)  06/28/19 164 lb (74.4 kg)  10/21/17 151 lb (68.5 kg)       Education/Counseling provided regarding diet and exercise, prevention of chronic diseases, smoking/tobacco cessation, if applicable, and reviewed "Covered Medicare Preventive Services."

## 2019-07-29 ENCOUNTER — Other Ambulatory Visit: Payer: Self-pay | Admitting: Family Medicine

## 2019-07-29 DIAGNOSIS — I10 Essential (primary) hypertension: Secondary | ICD-10-CM

## 2019-07-30 ENCOUNTER — Other Ambulatory Visit: Payer: Self-pay

## 2019-07-30 ENCOUNTER — Encounter: Payer: Self-pay | Admitting: Family Medicine

## 2019-07-30 ENCOUNTER — Ambulatory Visit (INDEPENDENT_AMBULATORY_CARE_PROVIDER_SITE_OTHER): Payer: Medicare Other | Admitting: Family Medicine

## 2019-07-30 VITALS — BP 155/73 | HR 69 | Temp 97.6°F | Ht 64.0 in | Wt 167.6 lb

## 2019-07-30 DIAGNOSIS — R600 Localized edema: Secondary | ICD-10-CM | POA: Diagnosis not present

## 2019-07-30 DIAGNOSIS — Z636 Dependent relative needing care at home: Secondary | ICD-10-CM

## 2019-07-30 DIAGNOSIS — R21 Rash and other nonspecific skin eruption: Secondary | ICD-10-CM | POA: Diagnosis not present

## 2019-07-30 DIAGNOSIS — M25551 Pain in right hip: Secondary | ICD-10-CM | POA: Diagnosis not present

## 2019-07-30 DIAGNOSIS — I10 Essential (primary) hypertension: Secondary | ICD-10-CM | POA: Diagnosis not present

## 2019-07-30 DIAGNOSIS — M25552 Pain in left hip: Secondary | ICD-10-CM

## 2019-07-30 MED ORDER — METRONIDAZOLE 0.75 % EX GEL: 1.0000 "application " | Freq: Two times a day (BID) | CUTANEOUS | 0 refills | Status: DC

## 2019-07-30 MED ORDER — METRONIDAZOLE 0.75 % EX GEL: 1.0000 "application " | Freq: Two times a day (BID) | CUTANEOUS | 0 refills | Status: DC | PRN

## 2019-07-30 NOTE — Patient Instructions (Addendum)
Tylenol as needed for occasional hip pain, but if that continues I would recommend follow up with your orthopaedist.   See info on stress below. I do recommend meeting with counselor if you are feeling down/depressed, or struggling with the stress. I am also happy to meet with you further to discuss medications for depression if you are feeling those symptoms.  Please let me know.  Swelling in the ankles could be related to salt in the diet.  Cutting back on frozen foods should help, but if the swelling is not improving, or if it worsens on the higher dose of amlodipine, let me know.  Recheck blood pressure in 1 month.  I sent a new prescription for metronidazole gel that can be used once or twice per day.  See if that is less costly than the other medication and let me know if not.   Continue Flonase for nasal congestion.  Glad to hear that is improving.  Thank you for coming in today.  If they were other concerns we are unable to address today, please schedule appointment so that I can help in those areas as well.  Take care    Stress, Adult Stress is a normal reaction to life events. Stress is what you feel when life demands more than you are used to, or more than you think you can handle. Some stress can be useful, such as studying for a test or meeting a deadline at work. Stress that occurs too often or for too long can cause problems. It can affect your emotional health and interfere with relationships and normal daily activities. Too much stress can weaken your body's defense system (immune system) and increase your risk for physical illness. If you already have a medical problem, stress can make it worse. What are the causes? All sorts of life events can cause stress. An event that causes stress for one person may not be stressful for another person. Major life events, whether positive or negative, commonly cause stress. Examples include:  Losing a job or starting a new job.  Losing a  loved one.  Moving to a new town or home.  Getting married or divorced.  Having a baby.  Getting injured or sick. Less obvious life events can also cause stress, especially if they occur day after day or in combination with each other. Examples include:  Working long hours.  Driving in traffic.  Caring for children.  Being in debt.  Being in a difficult relationship. What are the signs or symptoms? Stress can cause emotional symptoms, including:  Anxiety. This is feeling worried, afraid, on edge, overwhelmed, or out of control.  Anger, including irritation or impatience.  Depression. This is feeling sad, down, helpless, or guilty.  Trouble focusing, remembering, or making decisions. Stress can cause physical symptoms, including:  Aches and pains. These may affect your head, neck, back, stomach, or other areas of your body.  Tight muscles or a clenched jaw.  Low energy.  Trouble sleeping. Stress can cause unhealthy behaviors, including:  Eating to feel better (overeating) or skipping meals.  Working too much or putting off tasks.  Smoking, drinking alcohol, or using drugs to feel better. How is this diagnosed? Stress is diagnosed through an assessment by your health care provider. He or she may diagnose this condition based on:  Your symptoms and any stressful life events.  Your medical history.  Tests to rule out other causes of your symptoms. Depending on your condition, your health care  provider may refer you to a specialist for further evaluation. How is this treated?  Stress management techniques are the recommended treatment for stress. Medicine is not typically recommended for the treatment of stress. Techniques to reduce your reaction to stressful life events include:  Stress identification. Monitor yourself for symptoms of stress and identify what causes stress for you. These skills may help you to avoid or prepare for stressful events.  Time  management. Set your priorities, keep a calendar of events, and learn to say no. Taking these actions can help you avoid making too many commitments. Techniques for coping with stress include:  Rethinking the problem. Try to think realistically about stressful events rather than ignoring them or overreacting. Try to find the positives in a stressful situation rather than focusing on the negatives.  Exercise. Physical exercise can release both physical and emotional tension. The key is to find a form of exercise that you enjoy and do it regularly.  Relaxation techniques. These relax the body and mind. The key is to find one or more that you enjoy and use the techniques regularly. Examples include: ? Meditation, deep breathing, or progressive relaxation techniques. ? Yoga or tai chi. ? Biofeedback, mindfulness techniques, or journaling. ? Listening to music, being out in nature, or participating in other hobbies.  Practicing a healthy lifestyle. Eat a balanced diet, drink plenty of water, limit or avoid caffeine, and get plenty of sleep.  Having a strong support network. Spend time with family, friends, or other people you enjoy being around. Express your feelings and talk things over with someone you trust. Counseling or talk therapy with a mental health professional may be helpful if you are having trouble managing stress on your own. Follow these instructions at home: Lifestyle   Avoid drugs.  Do not use any products that contain nicotine or tobacco, such as cigarettes, e-cigarettes, and chewing tobacco. If you need help quitting, ask your health care provider.  Limit alcohol intake to no more than 1 drink a day for nonpregnant women and 2 drinks a day for men. One drink equals 12 oz of beer, 5 oz of wine, or 1 oz of hard liquor  Do not use alcohol or drugs to relax.  Eat a balanced diet that includes fresh fruits and vegetables, whole grains, lean meats, fish, eggs, and beans, and  low-fat dairy. Avoid processed foods and foods high in added fat, sugar, and salt.  Exercise at least 30 minutes on 5 or more days each week.  Get 7-8 hours of sleep each night. General instructions   Practice stress management techniques as discussed with your health care provider.  Drink enough fluid to keep your urine clear or pale yellow.  Take over-the-counter and prescription medicines only as told by your health care provider.  Keep all follow-up visits as told by your health care provider. This is important. Contact a health care provider if:  Your symptoms get worse.  You have new symptoms.  You feel overwhelmed by your problems and can no longer manage them on your own. Get help right away if:  You have thoughts of hurting yourself or others. If you ever feel like you may hurt yourself or others, or have thoughts about taking your own life, get help right away. You can go to your nearest emergency department or call:  Your local emergency services (911 in the [place]  A suicide crisis helpline, such as the [facility] at 778-782-8661. This is open  24 hours a day. Summary  Stress is a normal reaction to life events. It can cause problems if it happens too often or for too long.  Practicing stress management techniques is the best way to treat stress.  Counseling or talk therapy with a mental health professional may be helpful if you are having trouble managing stress on your own. This information is not intended to replace advice given to you by your health care provider. Make sure you discuss any questions you have with your health care provider. Document Revised: 09/11/2018 Document Reviewed: 04/03/2016 Elsevier Patient Education  [employer].     If you have lab work done today you will be contacted with your lab results within the next 2 weeks.  If you have not heard from [place] then please contact us. The fastest way to get your  results is to register for My Chart.   IF you received an x-ray today, you will receive an invoice from [facility]. Please contact [facility] at [phone] with questions or concerns regarding your invoice.   IF you received labwork today, you will receive an invoice from [facility]. Please contact [facility] at [phone] with questions or concerns regarding your invoice.   Our billing staff will not be able to assist you with questions regarding bills from these companies.  You will be contacted with the lab results as soon as they are available. The fastest way to get your results is to activate your My Chart account. Instructions are located on the last page of this paperwork. If you have not heard from [place] regarding the results in 2 weeks, please contact this office.    t

## 2019-07-30 NOTE — Progress Notes (Signed)
Subjective:  Patient ID: Molly Lambert, female    DOB: 1934-05-02  Age: 85 y.o. MRN: [id number]  CC:  Chief Complaint  Patient presents with  . Follow-up    BP and rash.     HPI Molly Lambert presents for   Follow-up from May 3  Hypertension: Decreased control as well as in office readings last visit.  Amlodipine increased to 10 mg daily, continued chlorthalidone 25 mg daily. Initial BP today 148/72. Did not start higher dose amlodipine until yesterday.  Some feet swelling past week (prior to increase in amlodipine). No Cp/dyspnea.  Possible more salt in diet recently - more frozen dinners lately. Plans to return to more fresh cooking.  Tired at times.  Some stress with taking care of spouse with dementia. Denies depression.  Denies SI.  Gardening for stress relief and house chores, cooking. Eating at times. Depression screen [name] 2/9 07/30/2019 07/20/2019 06/28/2019 06/16/2018 10/21/2017  Decreased Interest 0 0 0 0 0  Down, Depressed, Hopeless 0 0 0 0 0  PHQ - 2 Score 0 0 0 0 0  Altered sleeping - - - - -  Tired, decreased energy - - - - -  Change in appetite - - - - -  Feeling bad or failure about yourself  - - - - -  Trouble concentrating - - - - -  Moving slowly or fidgety/restless - - - - -  Suicidal thoughts - - - - -  PHQ-9 Score - - - - -  Difficult doing work/chores - - - - -     Home readings: 150's/70-80's.  BP Readings from Last 3 Encounters:  07/30/19 (!) 155/73  07/20/19 (!) 165/92  06/28/19 (!) 160/83   Lab Results  Component Value Date   CREATININE 0.81 06/28/2019   Face rash: Possible rosacea.  Start of metronidazole gel and handout given with rosacea and triggers.  Also started Flonase to minimize congestion and constant wiping of nose. Did not fill metronidazole - too costly. $100.  Comes and goes. Using triamcinolone once per day.  flonase has been helping with congestion.   Occasional hip pain, to buttocks for past year. No fall.  bilateral hip  replacements in 2018. Occasional alleve. No back pain.    History Patient Active Problem List   Diagnosis Date Noted  . OA (osteoarthritis) of hip 07/17/2016  . Breast microcalcifications 12/27/2011  . History of breast cancer 12/27/2011  . Hypothyroidism 08/25/2011  . Hypercholesteremia 08/25/2011  . HTN (hypertension) 08/25/2011  . Malignant neoplasm of central portion of right breast in female, estrogen receptor positive ([place]) 02/27/2011  . Breast mass, right 11/28/2010   Past Medical History:  Diagnosis Date  . Arthritis   . Breast cancer ([place]) 11/2010   Rt breast  . Cataract   . Erythema    reddened area of epidermis spnning concentrated around sinuses on the face; pt reports cracking, itchiness, and flaking  of affected skin; seen derm no absolute dx, will get 2nd opinion   . Hypercholesteremia   . Hypertension    NO CARDIAC MD,  [facility]  PCP  ,  URGENT MED  . Hypothyroidism   . Leg laceration    WITH SURGERY  . Paget's carcinoma of the nipple, right ([facility])    nipple removed  . Personal history of radiation therapy   . S/P radiation therapy 03/14/11 - 04/05/11   Right Breast / 4256 cGy in 16 Fractions  . Thyroid disease  Past Surgical History:  Procedure Laterality Date  . BREAST BIOPSY    . BREAST LUMPECTOMY  01/22/11   RIGHT BREAST LUMPECTOMY WITH BIOSPY OF 1 SENTINEL NODE, INVASIVE GRADE II DUCTAL CARCINOMA , INTERMEDIATE GRADE DUCTAL CARCINOMA IN SITU , DERMAL SKIN INVOLVED, MARGINS NEGATIVE,( 0/1)  NODE POSITIVE, ER+, PR+, LOW s-PHASE, HER 2 NEU- NO AMPLIFICATION  . BREAST SURGERY  11/28/10   SKIN-PUNCH BIOSPY RIGHT BREAST(NIPPLE), ER+, RP+, LOW S-PHASE, HER 2NEU NEGAITIVE  . EYE SURGERY      BILATERAL CATARACT SURGERY  . JOINT REPLACEMENT     left hip  . PARTIAL MASTECTOMY WITH NEEDLE LOCALIZATION  01/10/2012   Procedure: PARTIAL MASTECTOMY WITH NEEDLE LOCALIZATION;  Surgeon: Marcello Moores A. Cornett, MD;  Location: [place];  Service: General;   Laterality: Right;  right breast needle localized partial mastectomy and removal of nipple   . TOTAL HIP ARTHROPLASTY Left 07/17/2016   Procedure: LEFT TOTAL HIP ARTHROPLASTY ANTERIOR APPROACH;  Surgeon: Gaynelle Arabian, MD;  Location: [facility];  Service: Orthopedics;  Laterality: Left;  . TOTAL HIP ARTHROPLASTY Right 11/20/2016   Procedure: RIGHT TOTAL HIP ARTHROPLASTY ANTERIOR APPROACH;  Surgeon: Gaynelle Arabian, MD;  Location: [facility];  Service: Orthopedics;  Laterality: Right;  . TUBAL LIGATION  1972   Allergies  Allergen Reactions  . Xarelto [Rivaroxaban]    Prior to Admission medications   Medication Sig Start Date End Date Taking? Authorizing Provider  amLODipine (NORVASC) 10 MG tablet Take 1 tablet (10 mg total) by mouth daily. 06/28/19  Yes Wendie Agreste, MD  chlorthalidone (HYGROTON) 25 MG tablet Take 1 tablet (25 mg total) by mouth daily. 06/28/19  Yes Wendie Agreste, MD  fluticasone (FLONASE) 50 MCG/ACT nasal spray Place 1 spray into both nostrils daily. 06/28/19  Yes Wendie Agreste, MD  levothyroxine (SYNTHROID) 50 MCG tablet Take 1 tablet (50 mcg total) by mouth daily before breakfast. 06/28/19  Yes Wendie Agreste, MD  metroNIDAZOLE (METROGEL) 1 % gel Apply topically daily. To rash of face. 06/28/19  Yes Wendie Agreste, MD  simvastatin (ZOCOR) 20 MG tablet Take 1-2 tablets (20-40 mg total) by mouth daily at 6 PM. 06/28/19  Yes Wendie Agreste, MD   Social History   Socioeconomic History  . Marital status: Married    Spouse name: Not on file  . Number of children: 3  . Years of education: 24  . Highest education level: Not on file  Occupational History  . Occupation: Waitress    Comment: Secretary/administrator  Tobacco Use  . Smoking status: Never Smoker  . Smokeless tobacco: Never Used  Substance and Sexual Activity  . Alcohol use: No    Alcohol/week: 0.0 standard drinks    Comment: Rarely/On Special Occasions  . Drug use: No  . Sexual activity: Not on file     Comment: G3, P3, MENARCHE, AGE 80, MENOPAUSE AGE 89, NO BC AND HRT X 10 YEARS  Other Topics Concern  . Not on file  Social History Narrative   Married for 56 years      3 children, one deceased   22 grand-children, 3 great-grandchildren   Social Determinants of Health   Financial Resource Strain:   . Difficulty of Paying Living Expenses:   Food Insecurity:   . Worried About Charity fundraiser in the Last Year:   . Arboriculturist in the Last Year:   Transportation Needs:   . Film/video editor (Medical):   Marland Kitchen  Lack of Transportation (Non-Medical):   Physical Activity:   . Days of Exercise per Week:   . Minutes of Exercise per Session:   Stress:   . Feeling of Stress :   Social Connections:   . Frequency of Communication with Friends and Family:   . Frequency of Social Gatherings with Friends and Family:   . Attends Religious Services:   . Active Member of Clubs or Organizations:   . Attends Archivist Meetings:   Marland Kitchen Marital Status:   Intimate Partner Violence:   . Fear of Current or Ex-Partner:   . Emotionally Abused:   Marland Kitchen Physically Abused:   . Sexually Abused:     Review of Systems   Objective:   Vitals:   07/30/19 1006 07/30/19 1014  BP: (!) 148/72 (!) 155/73  Pulse: 69   Temp: 97.6 F (36.4 C)   TempSrc: Temporal   SpO2: 96%   Weight: 167 lb 9.6 oz (76 kg)   Height: '5\' 4"'  (1.626 m)      Physical Exam Vitals reviewed.  Constitutional:      Appearance: She is well-developed.  HENT:     Head: Normocephalic and atraumatic.  Eyes:     Conjunctiva/sclera: Conjunctivae normal.     Pupils: Pupils are equal, round, and reactive to light.  Neck:     Vascular: No carotid bruit.  Cardiovascular:     Rate and Rhythm: Normal rate and regular rhythm.     Heart sounds: Normal heart sounds.  Pulmonary:     Effort: Pulmonary effort is normal.     Breath sounds: Normal breath sounds.  Abdominal:     Palpations: Abdomen is soft. There is no  pulsatile mass.     Tenderness: There is no abdominal tenderness.  Musculoskeletal:     Right lower leg: Edema (BilaterallyTrace pedal edema, nonpitting to lower third bilaterally.) present.     Left lower leg: Edema present.     Comments: Pain-free internal and external rotation of hips bilaterally, negative seated straight leg raise, locates intermittent area of discomfort into the upper buttock area bilaterally towards lateral hip..  Skin:    General: Skin is warm and dry.       Neurological:     Mental Status: She is alert and oriented to person, place, and time.  Psychiatric:        Behavior: Behavior normal.     Assessment & Plan:  BAYLEI SIEBELS is a 85 y.o. female . Essential hypertension  -Still decreased control by just started amlodipine higher dose yesterday.  Continue chlorthalidone, amlodipine at current doses for now with RTC precautions if persistent edema with change in sodium intake, or any worsening edema with higher dose of amlodipine.  Recheck 1 month.  Rash of face - Plan: metroNIDAZOLE (METROGEL) 0.75 % gel, DISCONTINUED: metroNIDAZOLE (METROGEL) 0.75 % gel  -Still suspect component of rosacea.  Previous metronidazole gel was cost prohibitive, will try 0.75%.  Advised to let me know if that is still cost prohibitive.  Fluticasone has helped with nasal congestion.  Pedal edema  -As above, could be related to recent increase in frozen food/sodium in diet.  Plans to decrease frozen food, but cautioned about amlodipine and risk of edema.  Symptoms were noted prior to increase dose.  RTC precautions  Caregiver stress  -Suspected stress with care of spouse with dementia.  Denies depression, but concerned some of her fatigue may be related to stress versus depression. Option of counseling was  discussed.  Declined counseling at this time, deferred meds at this time but will recheck in 1 month.    Hip pain, bilateral  -Seems to be more posterior buttock, lateral hip than  proper hip joint.  Could be related to lumbar spine but denies any back pain.  Overall reassuring exam.  Trial of Tylenol, RTC precautions or follow-up with Ortho if persistent  Meds ordered this encounter  Medications  . DISCONTD: metroNIDAZOLE (METROGEL) 0.75 % gel    Sig: Apply 1 application topically 2 (two) times daily.    Dispense:  45 g    Refill:  0  . metroNIDAZOLE (METROGEL) 0.75 % gel    Sig: Apply 1 application topically 2 (two) times daily as needed.    Dispense:  45 g    Refill:  0   Patient Instructions   Tylenol as needed for occasional hip pain, but if that continues I would recommend follow up with your orthopaedist.   See info on stress below. I do recommend meeting with counselor if you are feeling down/depressed, or struggling with the stress. I am also happy to meet with you further to discuss medications for depression if you are feeling those symptoms.  Please let me know.  Swelling in the ankles could be related to salt in the diet.  Cutting back on frozen foods should help, but if the swelling is not improving, or if it worsens on the higher dose of amlodipine, let me know.  Recheck blood pressure in 1 month.  I sent a new prescription for metronidazole gel that can be used once or twice per day.  See if that is less costly than the other medication and let me know if not.   Continue Flonase for nasal congestion.  Glad to hear that is improving.  Thank you for coming in today.  If they were other concerns we are unable to address today, please schedule appointment so that I can help in those areas as well.  Take care    Stress, Adult Stress is a normal reaction to life events. Stress is what you feel when life demands more than you are used to, or more than you think you can handle. Some stress can be useful, such as studying for a test or meeting a deadline at work. Stress that occurs too often or for too long can cause problems. It can affect your emotional  health and interfere with relationships and normal daily activities. Too much stress can weaken your body's defense system (immune system) and increase your risk for physical illness. If you already have a medical problem, stress can make it worse. What are the causes? All sorts of life events can cause stress. An event that causes stress for one person may not be stressful for another person. Major life events, whether positive or negative, commonly cause stress. Examples include:  Losing a job or starting a new job.  Losing a loved one.  Moving to a new town or home.  Getting married or divorced.  Having a baby.  Getting injured or sick. Less obvious life events can also cause stress, especially if they occur day after day or in combination with each other. Examples include:  Working long hours.  Driving in traffic.  Caring for children.  Being in debt.  Being in a difficult relationship. What are the signs or symptoms? Stress can cause emotional symptoms, including:  Anxiety. This is feeling worried, afraid, on edge, overwhelmed, or out of control.  Anger, including irritation or impatience.  Depression. This is feeling sad, down, helpless, or guilty.  Trouble focusing, remembering, or making decisions. Stress can cause physical symptoms, including:  Aches and pains. These may affect your head, neck, back, stomach, or other areas of your body.  Tight muscles or a clenched jaw.  Low energy.  Trouble sleeping. Stress can cause unhealthy behaviors, including:  Eating to feel better (overeating) or skipping meals.  Working too much or putting off tasks.  Smoking, drinking alcohol, or using drugs to feel better. How is this diagnosed? Stress is diagnosed through an assessment by your health care provider. He or she may diagnose this condition based on:  Your symptoms and any stressful life events.  Your medical history.  Tests to rule out other causes of your  symptoms. Depending on your condition, your health care provider may refer you to a specialist for further evaluation. How is this treated?  Stress management techniques are the recommended treatment for stress. Medicine is not typically recommended for the treatment of stress. Techniques to reduce your reaction to stressful life events include:  Stress identification. Monitor yourself for symptoms of stress and identify what causes stress for you. These skills may help you to avoid or prepare for stressful events.  Time management. Set your priorities, keep a calendar of events, and learn to say no. Taking these actions can help you avoid making too many commitments. Techniques for coping with stress include:  Rethinking the problem. Try to think realistically about stressful events rather than ignoring them or overreacting. Try to find the positives in a stressful situation rather than focusing on the negatives.  Exercise. Physical exercise can release both physical and emotional tension. The key is to find a form of exercise that you enjoy and do it regularly.  Relaxation techniques. These relax the body and mind. The key is to find one or more that you enjoy and use the techniques regularly. Examples include: ? Meditation, deep breathing, or progressive relaxation techniques. ? Yoga or tai chi. ? Biofeedback, mindfulness techniques, or journaling. ? Listening to music, being out in nature, or participating in other hobbies.  Practicing a healthy lifestyle. Eat a balanced diet, drink plenty of water, limit or avoid caffeine, and get plenty of sleep.  Having a strong support network. Spend time with family, friends, or other people you enjoy being around. Express your feelings and talk things over with someone you trust. Counseling or talk therapy with a mental health professional may be helpful if you are having trouble managing stress on your own. Follow these instructions at  home: Lifestyle   Avoid drugs.  Do not use any products that contain nicotine or tobacco, such as cigarettes, e-cigarettes, and chewing tobacco. If you need help quitting, ask your health care provider.  Limit alcohol intake to no more than 1 drink a day for nonpregnant women and 2 drinks a day for men. One drink equals 12 oz of beer, 5 oz of wine, or 1 oz of hard liquor  Do not use alcohol or drugs to relax.  Eat a balanced diet that includes fresh fruits and vegetables, whole grains, lean meats, fish, eggs, and beans, and low-fat dairy. Avoid processed foods and foods high in added fat, sugar, and salt.  Exercise at least 30 minutes on 5 or more days each week.  Get 7-8 hours of sleep each night. General instructions   Practice stress management techniques as discussed with your health care provider.  Drink enough fluid to keep your urine clear or pale yellow.  Take over-the-counter and prescription medicines only as told by your health care provider.  Keep all follow-up visits as told by your health care provider. This is important. Contact a health care provider if:  Your symptoms get worse.  You have new symptoms.  You feel overwhelmed by your problems and can no longer manage them on your own. Get help right away if:  You have thoughts of hurting yourself or others. If you ever feel like you may hurt yourself or others, or have thoughts about taking your own life, get help right away. You can go to your nearest emergency department or call:  Your local emergency services (911 in the [place]  A suicide crisis helpline, such as the [facility] at 831-137-5360. This is open 24 hours a day. Summary  Stress is a normal reaction to life events. It can cause problems if it happens too often or for too long.  Practicing stress management techniques is the best way to treat stress.  Counseling or talk therapy with a mental health professional  may be helpful if you are having trouble managing stress on your own. This information is not intended to replace advice given to you by your health care provider. Make sure you discuss any questions you have with your health care provider. Document Revised: 09/11/2018 Document Reviewed: 04/03/2016 Elsevier Patient Education  [employer].     If you have lab work done today you will be contacted with your lab results within the next 2 weeks.  If you have not heard from [place] then please contact us. The fastest way to get your results is to register for My Chart.   IF you received an x-ray today, you will receive an invoice from [facility]. Please contact [facility] at [phone] with questions or concerns regarding your invoice.   IF you received labwork today, you will receive an invoice from [facility]. Please contact [facility] at [phone] with questions or concerns regarding your invoice.   Our billing staff will not be able to assist you with questions regarding bills from these companies.  You will be contacted with the lab results as soon as they are available. The fastest way to get your results is to activate your My Chart account. Instructions are located on the last page of this paperwork. If you have not heard from [place] regarding the results in 2 weeks, please contact this office.    t     Signed, [name], MD [facility]

## 2019-09-09 ENCOUNTER — Ambulatory Visit: Payer: Medicare Other | Admitting: Family Medicine

## 2019-09-10 ENCOUNTER — Encounter: Payer: Self-pay | Admitting: Family Medicine

## 2019-09-22 ENCOUNTER — Ambulatory Visit (INDEPENDENT_AMBULATORY_CARE_PROVIDER_SITE_OTHER): Payer: Medicare Other | Admitting: Family Medicine

## 2019-09-22 ENCOUNTER — Other Ambulatory Visit: Payer: Self-pay

## 2019-09-22 ENCOUNTER — Encounter: Payer: Self-pay | Admitting: Family Medicine

## 2019-09-22 DIAGNOSIS — R21 Rash and other nonspecific skin eruption: Secondary | ICD-10-CM

## 2019-09-22 DIAGNOSIS — R600 Localized edema: Secondary | ICD-10-CM

## 2019-09-22 DIAGNOSIS — E7800 Pure hypercholesterolemia, unspecified: Secondary | ICD-10-CM | POA: Diagnosis not present

## 2019-09-22 DIAGNOSIS — I10 Essential (primary) hypertension: Secondary | ICD-10-CM | POA: Diagnosis not present

## 2019-09-22 DIAGNOSIS — E876 Hypokalemia: Secondary | ICD-10-CM

## 2019-09-22 MED ORDER — LISINOPRIL 5 MG PO TABS
2.5000 mg | ORAL_TABLET | Freq: Every day | ORAL | 1 refills | Status: DC
Start: 2019-09-22 — End: 2020-03-27

## 2019-09-22 MED ORDER — CHLORTHALIDONE 25 MG PO TABS: 25.0000 mg | ORAL_TABLET | Freq: Every day | ORAL | 1 refills | Status: DC

## 2019-09-22 MED ORDER — SIMVASTATIN 20 MG PO TABS: 20.0000 mg | ORAL_TABLET | Freq: Every day | ORAL | 1 refills | Status: DC

## 2019-09-22 MED ORDER — AMLODIPINE BESYLATE 10 MG PO TABS: 10.0000 mg | ORAL_TABLET | Freq: Every day | ORAL | 1 refills | Status: DC

## 2019-09-22 MED ORDER — METRONIDAZOLE 0.75 % EX GEL: 1.0000 "application " | Freq: Two times a day (BID) | CUTANEOUS | 0 refills | Status: DC | PRN

## 2019-09-22 NOTE — Progress Notes (Signed)
Subjective:  Patient ID: Molly Lambert, female    DOB: 1934-05-11  Age: 85 y.o. MRN: [id number]  CC:  Chief Complaint  Patient presents with  . Hypertension    pt has been doing well checking bp noted slightly higher than todays on average about 140/70-80 pt notes no physical symptoms   . Rash    pt was unable to obtain gel that was Rx as it was $90 pt states she has been doing okay it has been managable at this time notes it is slightly itchy   . Edema    pt has cut back on salt and is over all doing well has noticed a significant increase in frequency and severity when it does occure     HPI Molly Lambert presents for  Follow-up from June 4.  Hypertension: Still uncontrolled last visit.  Had only started higher dose of amlodipine day prior.  Did notice some pedal edema previous week, prior to the increase in amlodipine.  Noted increased salt in diet with frozen dinners.  Planned to adjust diet and more fresh food.  Continued on chlorthalidone 25 mg daily, amlodipine 10 mg daily. Dealing with stressors at home better.  Home readings: 160's/80-90. Less leg swelling off salt - less frozen meals.   BP Readings from Last 3 Encounters:  07/30/19 (!) 155/73  07/20/19 (!) 165/92  06/28/19 (!) 160/83   Lab Results  Component Value Date   CREATININE 0.81 06/28/2019   Hyperlipidemia: Simvastatin 20mg  qd. No new myalgias.   Lab Results  Component Value Date   CHOL 287 (H) 06/28/2019   HDL 64 06/28/2019   LDLCALC 187 (H) 06/28/2019   LDLDIRECT 131 (H) 08/22/2011   TRIG 193 (H) 06/28/2019   CHOLHDL 4.5 (H) 06/28/2019   Lab Results  Component Value Date   ALT 16 06/28/2019   AST 15 06/28/2019   ALKPHOS 92 06/28/2019   BILITOT 0.3 06/28/2019      Hypokalemia: Mildly low at 3.3 on May 3, potassium rich foods recommended.  Plan for repeat testing today  Face rash: Suspected rosacea previously.  Metronidazole gel has been ordered, and changed to 0.75% formulation last  visit but still cost prohibitive. Was not able to afford.  Rash comes and goes - flares up after being outside in the heat.  Cleans face with dove usually, no creams, no makeup.   History Patient Active Problem List   Diagnosis Date Noted  . OA (osteoarthritis) of hip 07/17/2016  . Breast microcalcifications 12/27/2011  . History of breast cancer 12/27/2011  . Hypothyroidism 08/25/2011  . Hypercholesteremia 08/25/2011  . HTN (hypertension) 08/25/2011  . Malignant neoplasm of central portion of right breast in female, estrogen receptor positive ([place]) 02/27/2011  . Breast mass, right 11/28/2010   Past Medical History:  Diagnosis Date  . Arthritis   . Breast cancer ([place]) 11/2010   Rt breast  . Cataract   . Erythema    reddened area of epidermis spnning concentrated around sinuses on the face; pt reports cracking, itchiness, and flaking  of affected skin; seen derm no absolute dx, will get 2nd opinion   . Hypercholesteremia   . Hypertension    NO CARDIAC MD,  [facility]  PCP  ,  URGENT MED  . Hypothyroidism   . Leg laceration    WITH SURGERY  . Paget's carcinoma of the nipple, right ([facility])    nipple removed  . Personal history of radiation therapy   . S/P radiation  therapy 03/14/11 - 04/05/11   Right Breast / 4256 cGy in 16 Fractions  . Thyroid disease    Past Surgical History:  Procedure Laterality Date  . BREAST BIOPSY    . BREAST LUMPECTOMY  01/22/11   RIGHT BREAST LUMPECTOMY WITH BIOSPY OF 1 SENTINEL NODE, INVASIVE GRADE II DUCTAL CARCINOMA , INTERMEDIATE GRADE DUCTAL CARCINOMA IN SITU , DERMAL SKIN INVOLVED, MARGINS NEGATIVE,( 0/1)  NODE POSITIVE, ER+, PR+, LOW s-PHASE, HER 2 NEU- NO AMPLIFICATION  . BREAST SURGERY  11/28/10   SKIN-PUNCH BIOSPY RIGHT BREAST(NIPPLE), ER+, RP+, LOW S-PHASE, HER 2NEU NEGAITIVE  . EYE SURGERY      BILATERAL CATARACT SURGERY  . JOINT REPLACEMENT     left hip  . PARTIAL MASTECTOMY WITH NEEDLE LOCALIZATION  01/10/2012   Procedure: PARTIAL  MASTECTOMY WITH NEEDLE LOCALIZATION;  Surgeon: Marcello Moores A. Cornett, MD;  Location: [facility];  Service: General;  Laterality: Right;  right breast needle localized partial mastectomy and removal of nipple   . TOTAL HIP ARTHROPLASTY Left 07/17/2016   Procedure: LEFT TOTAL HIP ARTHROPLASTY ANTERIOR APPROACH;  Surgeon: Gaynelle Arabian, MD;  Location: [facility];  Service: Orthopedics;  Laterality: Left;  . TOTAL HIP ARTHROPLASTY Right 11/20/2016   Procedure: RIGHT TOTAL HIP ARTHROPLASTY ANTERIOR APPROACH;  Surgeon: Gaynelle Arabian, MD;  Location: [facility];  Service: Orthopedics;  Laterality: Right;  . TUBAL LIGATION  1972   Allergies  Allergen Reactions  . Xarelto [Rivaroxaban]    Prior to Admission medications   Medication Sig Start Date End Date Taking? Authorizing Provider  amLODipine (NORVASC) 10 MG tablet Take 1 tablet (10 mg total) by mouth daily. 06/28/19  Yes Wendie Agreste, MD  chlorthalidone (HYGROTON) 25 MG tablet Take 1 tablet (25 mg total) by mouth daily. 06/28/19  Yes Wendie Agreste, MD  fluticasone (FLONASE) 50 MCG/ACT nasal spray Place 1 spray into both nostrils daily. 06/28/19  Yes Wendie Agreste, MD  levothyroxine (SYNTHROID) 50 MCG tablet Take 1 tablet (50 mcg total) by mouth daily before breakfast. 06/28/19  Yes Wendie Agreste, MD  simvastatin (ZOCOR) 20 MG tablet Take 1-2 tablets (20-40 mg total) by mouth daily at 6 PM. 06/28/19  Yes Wendie Agreste, MD  metroNIDAZOLE (METROGEL) 0.75 % gel Apply 1 application topically 2 (two) times daily as needed. Patient not taking: Reported on 09/22/2019 07/30/19   Wendie Agreste, MD   Social History   Socioeconomic History  . Marital status: Married    Spouse name: Not on file  . Number of children: 3  . Years of education: 39  . Highest education level: Not on file  Occupational History  . Occupation: Waitress    Comment: Secretary/administrator  Tobacco Use  . Smoking status: Never Smoker  . Smokeless tobacco: Never  Used  Substance and Sexual Activity  . Alcohol use: No    Alcohol/week: 0.0 standard drinks    Comment: Rarely/On Special Occasions  . Drug use: No  . Sexual activity: Not on file    Comment: G3, P3, MENARCHE, AGE 41, MENOPAUSE AGE 30, NO BC AND HRT X 10 YEARS  Other Topics Concern  . Not on file  Social History Narrative   Married for 56 years      3 children, one deceased   67 grand-children, 3 great-grandchildren   Social Determinants of Health   Financial Resource Strain:   . Difficulty of Paying Living Expenses:   Food Insecurity:   . Worried About Running  Out of Food in the Last Year:   . [place] in the Last Year:   Transportation Needs:   . Lack of Transportation (Medical):   Marland Kitchen Lack of Transportation (Non-Medical):   Physical Activity:   . Days of Exercise per Week:   . Minutes of Exercise per Session:   Stress:   . Feeling of Stress :   Social Connections:   . Frequency of Communication with Friends and Family:   . Frequency of Social Gatherings with Friends and Family:   . Attends Religious Services:   . Active Member of Clubs or Organizations:   . Attends Archivist Meetings:   Marland Kitchen Marital Status:   Intimate Partner Violence:   . Fear of Current or Ex-Partner:   . Emotionally Abused:   Marland Kitchen Physically Abused:   . Sexually Abused:     Review of Systems  Constitutional: Negative for fatigue and unexpected weight change.  Respiratory: Negative for chest tightness and shortness of breath.   Cardiovascular: Positive for leg swelling. Negative for chest pain and palpitations.  Gastrointestinal: Negative for abdominal pain and blood in stool.  Neurological: Negative for dizziness, syncope, light-headedness and headaches.     Objective:  There were no vitals filed for this visit.   Physical Exam Vitals reviewed.  Constitutional:      Appearance: She is well-developed.  HENT:     Head: Normocephalic and atraumatic.  Eyes:      Conjunctiva/sclera: Conjunctivae normal.     Pupils: Pupils are equal, round, and reactive to light.  Neck:     Vascular: No carotid bruit.  Cardiovascular:     Rate and Rhythm: Normal rate and regular rhythm.     Heart sounds: Normal heart sounds.  Pulmonary:     Effort: Pulmonary effort is normal.     Breath sounds: Normal breath sounds.  Abdominal:     Palpations: Abdomen is soft. There is no pulsatile mass.     Tenderness: There is no abdominal tenderness.  Skin:    General: Skin is warm and dry.       Neurological:     Mental Status: She is alert and oriented to person, place, and time.  Psychiatric:        Behavior: Behavior normal.      43min visit, greater than 50% counseling.   Assessment & Plan:  Molly Lambert is a 85 y.o. female . Essential hypertension - Plan: Basic metabolic panel, amLODipine (NORVASC) 10 MG tablet, chlorthalidone (HYGROTON) 25 MG tablet Hypokalemia - Plan: Basic metabolic panel  -Decreased control.  We will continue chlorthalidone, amlodipine, add low-dose lisinopril 2.5 mg daily.  Potential side effects discussed, check BMP.  Prior hyperkalemia, may need supplement with use of chlorthalidone.  Pedal edema - Plan: Basic metabolic panel  -Improved with decrease sodium in the diet, no significant edema noted on exam at present.  Rash of face - Plan: metroNIDAZOLE (METROGEL) 0.75 % gel  -Still appears to be consistent with rosacea, trigger avoidance discussed, metronidazole has been cost prohibitive concerns, good Rx card given as option out-of-pocket.  Can be used as needed.  Hypercholesteremia - Plan: simvastatin (ZOCOR) 20 MG tablet  -Tolerating statin, continue same  Meds ordered this encounter  Medications  . metroNIDAZOLE (METROGEL) 0.75 % gel    Sig: Apply 1 application topically 2 (two) times daily as needed.    Dispense:  45 g    Refill:  0  . lisinopril (ZESTRIL) 5 MG  tablet    Sig: Take 0.5 tablets (2.5 mg total) by mouth daily.      Dispense:  45 tablet    Refill:  1  . amLODipine (NORVASC) 10 MG tablet    Sig: Take 1 tablet (10 mg total) by mouth daily.    Dispense:  90 tablet    Refill:  1  . chlorthalidone (HYGROTON) 25 MG tablet    Sig: Take 1 tablet (25 mg total) by mouth daily.    Dispense:  90 tablet    Refill:  1  . simvastatin (ZOCOR) 20 MG tablet    Sig: Take 1-2 tablets (20-40 mg total) by mouth daily at 6 PM.    Dispense:  90 tablet    Refill:  1   Patient Instructions    Add new blood pressure medicine lisinopril, 1/2 pill/day.  Continue amlodipine and chlorthalidone both once per day.  I will check your potassium level but sometimes can be low with your medications.  I will let you know if there are concerns, or if supplement is needed.  Leg swelling looks much better, good work on the diet changes.  See information below on rosacea.  Avoidance of triggers can be helpful, but also can try metronidazole gel once or twice per day.  I did print that to be used with the Good Rx savings card, Kristopher Oppenheim may be less expensive.    Rosacea Rosacea is a long-term (chronic) condition that affects the skin of the face, including the cheeks, nose, forehead, and chin. This condition can also affect the eyes. Rosacea causes blood vessels near the surface of the skin to enlarge, which results in redness. What are the causes? The cause of this condition is not known. Certain triggers can make rosacea worse, including:  Hot baths.  Exercise.  Sunlight.  Very hot or cold temperatures.  Hot or spicy foods and drinks.  Drinking alcohol.  Stress.  Taking blood pressure medicine.  Long-term use of topical steroids on the face. What increases the risk? You are more likely to develop this condition if you:  Are older than 83 years of age.  Are a woman.  Have light-colored skin (light complexion).  Have a family history of rosacea. What are the signs or symptoms? Symptoms of this condition  include:  Redness of the face.  Red bumps or pimples on the face.  A red, enlarged nose.  Blushing easily.  Red lines on the skin.  Irritated, burning, or itchy feeling in the eyes.  Swollen eyelids.  Drainage from the eyes.  Feeling like there is something in your eye. How is this diagnosed? This condition is diagnosed with a medical history and physical exam. How is this treated? There is no cure for this condition, but treatment can help to control your symptoms. Your health care provider may recommend that you see a skin specialist (dermatologist). Treatment may include:  Medicines that are applied to the skin or taken by mouth (orally). This can include antibiotic medicines.  Laser treatment to improve the appearance of the skin.  Surgery. This is rare. Your health care provider will also recommend the best way to take care of your skin. Even after your skin improves, you will likely need to continue treatment to prevent your rosacea from coming back. Follow these instructions at home: Skin care Take care of your skin as told by your health care provider. You may be told to do these things:  Wash your skin gently  two or more times each day.  Use mild soap.  Use a sunscreen or sunblock with SPF 30 or greater.  Use gentle cosmetics that are meant for sensitive skin.  Shave with an electric shaver instead of a blade. Lifestyle  Try to keep track of what foods trigger this condition. Avoid any triggers. These may include: ? Spicy foods. ? Seafood. ? Cheese. ? Hot liquids. ? Nuts. ? Chocolate. ? Iodized salt.  Do not drink alcohol.  Avoid extremely cold or hot temperatures.  Try to reduce your stress. If you need help, talk with your health care provider.  When you exercise, do these things to stay cool: ? Limit sun exposure to your face. ? Use a fan. ? Do shorter and more frequent intervals of exercise. General instructions  Take and apply  over-the-counter and prescription medicines only as told by your health care provider.  If you were prescribed an antibiotic medicine, apply it or take it as told by your health care provider. Do not stop using the antibiotic even if your condition improves.  If your eyelids are affected, apply warm compresses to them. Do this as told by your health care provider.  Keep all follow-up visits as told by your health care provider. This is important. Contact a health care provider if:  Your symptoms get worse.  Your symptoms do not improve after 2 months of treatment.  You have new symptoms.  You have any changes in vision or you have problems with your eyes, such as redness or itching.  You feel depressed.  You lose your appetite.  You have trouble concentrating. Summary  Rosacea is a long-term (chronic) condition that affects the skin of the face, including the cheeks, nose, forehead, and chin.  Take care of your skin as told by your health care provider.  Take and apply over-the-counter and prescription medicines only as told by your health care provider.  Contact a health care provider if your symptoms get worse or if you have any changes in vision or other problems with your eyes, such as redness or itching.  Keep all follow-up visits as told by your health care provider. This is important. This information is not intended to replace advice given to you by your health care provider. Make sure you discuss any questions you have with your health care provider. Document Revised: 07/16/2017 Document Reviewed: 07/16/2017 Elsevier Patient Education  [employer].    If you have lab work done today you will be contacted with your lab results within the next 2 weeks.  If you have not heard from [place] then please contact us. The fastest way to get your results is to register for My Chart.   IF you received an x-ray today, you will receive an invoice from [facility].  Please contact [facility] at [phone] with questions or concerns regarding your invoice.   IF you received labwork today, you will receive an invoice from [place]. Please contact [facility] at [phone] with questions or concerns regarding your invoice.   Our billing staff will not be able to assist you with questions regarding bills from these companies.  You will be contacted with the lab results as soon as they are available. The fastest way to get your results is to activate your My Chart account. Instructions are located on the last page of this paperwork. If you have not heard from [place] regarding the results in 2 weeks, please contact this office.  Signed, [name], MD [facility]

## 2019-09-22 NOTE — Patient Instructions (Addendum)
Add new blood pressure medicine lisinopril, 1/2 pill/day.  Continue amlodipine and chlorthalidone both once per day.  I will check your potassium level but sometimes can be low with your medications.  I will let you know if there are concerns, or if supplement is needed.  Leg swelling looks much better, good work on the diet changes.  See information below on rosacea.  Avoidance of triggers can be helpful, but also can try metronidazole gel once or twice per day.  I did print that to be used with the Good Rx savings card, Kristopher Oppenheim may be less expensive.    Rosacea Rosacea is a long-term (chronic) condition that affects the skin of the face, including the cheeks, nose, forehead, and chin. This condition can also affect the eyes. Rosacea causes blood vessels near the surface of the skin to enlarge, which results in redness. What are the causes? The cause of this condition is not known. Certain triggers can make rosacea worse, including:  Hot baths.  Exercise.  Sunlight.  Very hot or cold temperatures.  Hot or spicy foods and drinks.  Drinking alcohol.  Stress.  Taking blood pressure medicine.  Long-term use of topical steroids on the face. What increases the risk? You are more likely to develop this condition if you:  Are older than 83 years of age.  Are a woman.  Have light-colored skin (light complexion).  Have a family history of rosacea. What are the signs or symptoms? Symptoms of this condition include:  Redness of the face.  Red bumps or pimples on the face.  A red, enlarged nose.  Blushing easily.  Red lines on the skin.  Irritated, burning, or itchy feeling in the eyes.  Swollen eyelids.  Drainage from the eyes.  Feeling like there is something in your eye. How is this diagnosed? This condition is diagnosed with a medical history and physical exam. How is this treated? There is no cure for this condition, but treatment can help to control  your symptoms. Your health care provider may recommend that you see a skin specialist (dermatologist). Treatment may include:  Medicines that are applied to the skin or taken by mouth (orally). This can include antibiotic medicines.  Laser treatment to improve the appearance of the skin.  Surgery. This is rare. Your health care provider will also recommend the best way to take care of your skin. Even after your skin improves, you will likely need to continue treatment to prevent your rosacea from coming back. Follow these instructions at home: Skin care Take care of your skin as told by your health care provider. You may be told to do these things:  Wash your skin gently two or more times each day.  Use mild soap.  Use a sunscreen or sunblock with SPF 30 or greater.  Use gentle cosmetics that are meant for sensitive skin.  Shave with an electric shaver instead of a blade. Lifestyle  Try to keep track of what foods trigger this condition. Avoid any triggers. These may include: ? Spicy foods. ? Seafood. ? Cheese. ? Hot liquids. ? Nuts. ? Chocolate. ? Iodized salt.  Do not drink alcohol.  Avoid extremely cold or hot temperatures.  Try to reduce your stress. If you need help, talk with your health care provider.  When you exercise, do these things to stay cool: ? Limit sun exposure to your face. ? Use a fan. ? Do shorter and more frequent intervals of exercise. General instructions  Take  and apply over-the-counter and prescription medicines only as told by your health care provider.  If you were prescribed an antibiotic medicine, apply it or take it as told by your health care provider. Do not stop using the antibiotic even if your condition improves.  If your eyelids are affected, apply warm compresses to them. Do this as told by your health care provider.  Keep all follow-up visits as told by your health care provider. This is important. Contact a health care provider  if:  Your symptoms get worse.  Your symptoms do not improve after 2 months of treatment.  You have new symptoms.  You have any changes in vision or you have problems with your eyes, such as redness or itching.  You feel depressed.  You lose your appetite.  You have trouble concentrating. Summary  Rosacea is a long-term (chronic) condition that affects the skin of the face, including the cheeks, nose, forehead, and chin.  Take care of your skin as told by your health care provider.  Take and apply over-the-counter and prescription medicines only as told by your health care provider.  Contact a health care provider if your symptoms get worse or if you have any changes in vision or other problems with your eyes, such as redness or itching.  Keep all follow-up visits as told by your health care provider. This is important. This information is not intended to replace advice given to you by your health care provider. Make sure you discuss any questions you have with your health care provider. Document Revised: 07/16/2017 Document Reviewed: 07/16/2017 Elsevier Patient Education  [employer].    If you have lab work done today you will be contacted with your lab results within the next 2 weeks.  If you have not heard from [place] then please contact us. The fastest way to get your results is to register for My Chart.   IF you received an x-ray today, you will receive an invoice from [facility]. Please contact [facility] at [phone] with questions or concerns regarding your invoice.   IF you received labwork today, you will receive an invoice from [place]. Please contact [facility] at [phone] with questions or concerns regarding your invoice.   Our billing staff will not be able to assist you with questions regarding bills from these companies.  You will be contacted with the lab results as soon as they are available. The fastest way to get your results  is to activate your My Chart account. Instructions are located on the last page of this paperwork. If you have not heard from [place] regarding the results in 2 weeks, please contact this office.

## 2019-09-23 LAB — BASIC METABOLIC PANEL
BUN/Creatinine Ratio: 25 (ref 12–28)
BUN: 22 mg/dL (ref 8–27)
CO2: 21 mmol/L (ref 20–29)
Calcium: 9.7 mg/dL (ref 8.7–10.3)
Chloride: 96 mmol/L (ref 96–106)
Creatinine, Ser: 0.88 mg/dL (ref 0.57–1.00)
GFR calc Af Amer: 70 mL/min/{1.73_m2} (ref >59)
GFR calc non Af Amer: 61 mL/min/{1.73_m2} (ref >59)
Glucose: 103 mg/dL — ABNORMAL HIGH (ref 65–99)
Potassium: 3.4 mmol/L — ABNORMAL LOW (ref 3.5–5.2)
Sodium: 139 mmol/L (ref 134–144)

## 2019-09-28 ENCOUNTER — Encounter: Payer: Self-pay | Admitting: Radiology

## 2019-10-30 ENCOUNTER — Other Ambulatory Visit: Payer: Self-pay | Admitting: Family Medicine

## 2019-10-30 DIAGNOSIS — E039 Hypothyroidism, unspecified: Secondary | ICD-10-CM

## 2019-11-04 ENCOUNTER — Ambulatory Visit: Payer: Medicare Other | Admitting: Family Medicine

## 2019-12-28 ENCOUNTER — Other Ambulatory Visit: Payer: Self-pay | Admitting: Family Medicine

## 2019-12-28 DIAGNOSIS — E7800 Pure hypercholesterolemia, unspecified: Secondary | ICD-10-CM

## 2020-03-27 ENCOUNTER — Other Ambulatory Visit: Payer: Self-pay | Admitting: Family Medicine

## 2020-03-27 DIAGNOSIS — I10 Essential (primary) hypertension: Secondary | ICD-10-CM

## 2020-03-27 DIAGNOSIS — E7800 Pure hypercholesterolemia, unspecified: Secondary | ICD-10-CM

## 2020-03-27 NOTE — Telephone Encounter (Signed)
Courtesy refill. Needs appt . No valid encounter within 6 months

## 2020-04-17 ENCOUNTER — Other Ambulatory Visit: Payer: Self-pay | Admitting: Family Medicine

## 2020-04-17 DIAGNOSIS — I10 Essential (primary) hypertension: Secondary | ICD-10-CM

## 2020-04-17 NOTE — Telephone Encounter (Signed)
Requested medication (s) are due for refill today:  yes  Requested medication (s) are on the active medication list: yes  Last refill:  01/16/2020  Future visit scheduled: no  Notes to clinic:  overdue for 6 month follow up  Requested Prescriptions  Pending Prescriptions Disp Refills   chlorthalidone (HYGROTON) 25 MG tablet [Pharmacy Med Name: CHLORTHALIDONE 25MG  TABLETS] 90 tablet 1    Sig: TAKE 1 TABLET(25 MG) BY MOUTH DAILY      Cardiovascular: Diuretics - Thiazide Failed - 04/17/2020  6:35 AM      Failed - K in normal range and within 360 days    Potassium  Date Value Ref Range Status  09/22/2019 3.4 (L) 3.5 - 5.2 mmol/L Final  04/16/2016 3.7 3.5 - 5.1 mEq/L Final          Failed - Last BP in normal range    BP Readings from Last 1 Encounters:  07/30/19 (!) 155/73          Failed - Valid encounter within last 6 months    Recent Outpatient Visits           6 months ago Essential hypertension   [facility], MD   8 months ago Essential hypertension   [facility], Ranell Patrick, MD   9 months ago Essential hypertension   [facility], Ranell Patrick, MD   1 year ago Hypothyroidism, unspecified type   [facility], Ranell Patrick, MD   2 years ago Sore throat   [facility], Ranell Patrick, MD                Passed - Ca in normal range and within 360 days    Calcium  Date Value Ref Range Status  09/22/2019 9.7 8.7 - 10.3 mg/dL Final  04/16/2016 10.0 8.4 - 10.4 mg/dL Final          Passed - Cr in normal range and within 360 days    Creatinine  Date Value Ref Range Status  04/16/2016 0.9 0.6 - 1.1 mg/dL Final   Creatinine, Ser  Date Value Ref Range Status  09/22/2019 0.88 0.57 - 1.00 mg/dL Final          Passed - Na in normal range and within 360 days    Sodium  Date Value Ref Range Status  09/22/2019 139 134 - 144 mmol/L Final  04/16/2016 138 136 - 145 mEq/L Final

## 2020-06-30 ENCOUNTER — Ambulatory Visit: Payer: Medicare Other | Admitting: Family Medicine

## 2020-08-22 ENCOUNTER — Other Ambulatory Visit: Payer: Self-pay | Admitting: Family Medicine

## 2020-08-22 DIAGNOSIS — E039 Hypothyroidism, unspecified: Secondary | ICD-10-CM

## 2020-08-31 ENCOUNTER — Other Ambulatory Visit: Payer: Self-pay | Admitting: Family Medicine

## 2020-08-31 DIAGNOSIS — E039 Hypothyroidism, unspecified: Secondary | ICD-10-CM

## 2020-09-11 ENCOUNTER — Other Ambulatory Visit: Payer: Self-pay | Admitting: Family Medicine

## 2020-09-11 DIAGNOSIS — I10 Essential (primary) hypertension: Secondary | ICD-10-CM

## 2020-09-11 DIAGNOSIS — E039 Hypothyroidism, unspecified: Secondary | ICD-10-CM

## 2020-10-02 ENCOUNTER — Encounter: Payer: Self-pay | Admitting: Registered Nurse

## 2020-10-02 ENCOUNTER — Other Ambulatory Visit: Payer: Self-pay

## 2020-10-02 ENCOUNTER — Ambulatory Visit (INDEPENDENT_AMBULATORY_CARE_PROVIDER_SITE_OTHER): Payer: Medicare Other | Admitting: Registered Nurse

## 2020-10-02 VITALS — BP 144/72 | HR 73 | Temp 98.2°F | Resp 18 | Ht 64.0 in | Wt 165.0 lb

## 2020-10-02 DIAGNOSIS — E7800 Pure hypercholesterolemia, unspecified: Secondary | ICD-10-CM | POA: Diagnosis not present

## 2020-10-02 DIAGNOSIS — R0981 Nasal congestion: Secondary | ICD-10-CM | POA: Diagnosis not present

## 2020-10-02 DIAGNOSIS — I10 Essential (primary) hypertension: Secondary | ICD-10-CM | POA: Diagnosis not present

## 2020-10-02 DIAGNOSIS — G8929 Other chronic pain: Secondary | ICD-10-CM | POA: Diagnosis not present

## 2020-10-02 DIAGNOSIS — E039 Hypothyroidism, unspecified: Secondary | ICD-10-CM

## 2020-10-02 DIAGNOSIS — M25511 Pain in right shoulder: Secondary | ICD-10-CM

## 2020-10-02 MED ORDER — LISINOPRIL 5 MG PO TABS: ORAL_TABLET | ORAL | 1 refills | Status: DC

## 2020-10-02 MED ORDER — AMLODIPINE BESYLATE 10 MG PO TABS: ORAL_TABLET | ORAL | 1 refills | Status: DC

## 2020-10-02 MED ORDER — FLUTICASONE PROPIONATE 50 MCG/ACT NA SUSP: 1.0000 | Freq: Every day | NASAL | 6 refills | Status: AC

## 2020-10-02 MED ORDER — LEVOTHYROXINE SODIUM 50 MCG PO TABS: ORAL_TABLET | ORAL | 1 refills | Status: DC

## 2020-10-02 MED ORDER — CHLORTHALIDONE 25 MG PO TABS: ORAL_TABLET | ORAL | 1 refills | Status: DC

## 2020-10-02 MED ORDER — SIMVASTATIN 20 MG PO TABS: ORAL_TABLET | ORAL | 1 refills | Status: DC

## 2020-10-02 NOTE — Patient Instructions (Addendum)
Molly Lambert -   Molly Lambert to speak with you  Medications have been refilled - see you in 6 mo  Labs today will be back tomorrow  Xray ordered to [place] at [facility]. They can generally accept walk ins, but you can call them if you'd prefer: [phone]. Otherwise, they will call you  Let me know if you need anything  Thank you  Molly Lambert     If you have lab work done today you will be contacted with your lab results within the next 2 weeks.  If you have not heard from [place] then please contact us. The fastest way to get your results is to register for My Chart.   IF you received an x-ray today, you will receive an invoice from [facility]. Please contact [facility] at [phone] with questions or concerns regarding your invoice.   IF you received labwork today, you will receive an invoice from [place]. Please contact [facility] at [phone] with questions or concerns regarding your invoice.   Our billing staff will not be able to assist you with questions regarding bills from these companies.  You will be contacted with the lab results as soon as they are available. The fastest way to get your results is to activate your My Chart account. Instructions are located on the last page of this paperwork. If you have not heard from [place] regarding the results in 2 weeks, please contact this office.

## 2020-10-02 NOTE — Progress Notes (Signed)
Established Patient Office Visit  Subjective:  Patient ID: Molly Lambert, female    DOB: [birth year]  Age: 86 y.o. MRN: [id number]  CC:  Chief Complaint  Patient presents with   Medication Refill    Patient states she is here for a medication refill    HPI Molly Lambert presents for med refill  Hypertension: Patient Currently taking: amlodipine 422m po qd, chlorthalidone 266mpo qd, lisinopril 2.22m43mo qd. Good effect. No AEs. Denies CV symptoms including: chest pain, shob, doe, headache, visual changes, fatigue, claudication, and dependent edema.   Previous readings and labs: BP Readings from Last 3 Encounters:  10/02/20 (!) 150/61  07/30/19 (!) 155/73  07/20/19 (!) 165/92   Lab Results  Component Value Date   CREATININE 0.88 09/22/2019   Does note she had run out of her amlodipine for about 1 week. Also curious as to whether or not she should take asa 71m16m qd as preventative measure. Has been taking this in lieu of her amlodipine lately.  HLD Taking simvastatin 20mg27mqd Lab Results  Component Value Date   CHOL 287 (H) 06/28/2019   HDL 64 06/28/2019   LDLCALC 187 (H) 06/28/2019   LDLDIRECT 131 (H) 08/22/2011   TRIG 193 (H) 06/28/2019   CHOLHDL 4.5 (H) 06/28/2019  No AE from med. Wants to continue.  Hypothyroid Taking levothyroxine 50mg 3md No AE, hopes to continue Denies symptoms of thyroid dysfunction.   Past Medical History:  Diagnosis Date   Arthritis    Breast cancer (HCC) [id number]   Rt breast   Cataract    Erythema    reddened area of epidermis spnning concentrated around sinuses on the face; pt reports cracking, itchiness, and flaking  of affected skin; seen derm no absolute dx, will get 2nd opinion    Hypercholesteremia    Hypertension    NO CARDIAC MD,  [facility] ,  URGENT MED   Hypothyroidism    Leg laceration    WITH SURGERY   Paget's carcinoma of the nipple, right (HCC)  Trentonipple removed   Personal history of radiation  therapy    S/P radiation therapy 03/14/11 - 04/05/11   Right Breast / 4256 cGy in 16 Fractions   Thyroid disease     Past Surgical History:  Procedure Laterality Date   BREAST BIOPSY     BREAST LUMPECTOMY  01/22/11   RIGHT BREAST LUMPECTOMY WITH BIOSPY OF 1 SENTINEL NODE, INVASIVE GRADE II DUCTAL CARCINOMA , INTERMEDIATE GRADE DUCTAL CARCINOMA IN SITU , DERMAL SKIN INVOLVED, MARGINS NEGATIVE,( 0/1)  NODE POSITIVE, ER+, PR+, LOW s-PHASE, HER 2 NEU- NO AMPLIFICATION   BREAST SURGERY  11/28/10   SKIN-PUNCH BIOSPY RIGHT BREAST(NIPPLE), ER+, RP+, LOW S-PHASE, HER 2NEU NEGAITIVE   EYE SURGERY      BILATERAL CATARACT SURGERY   JOINT REPLACEMENT     left hip   PARTIAL MASTECTOMY WITH NEEDLE LOCALIZATION  01/10/2012   Procedure: PARTIAL MASTECTOMY WITH NEEDLE LOCALIZATION;  Surgeon: ThomasMarcello Mooresrnett, MD;  Location: [facility]: General;  Laterality: Right;  right breast needle localized partial mastectomy and removal of nipple    TOTAL HIP ARTHROPLASTY Left 07/17/2016   Procedure: LEFT TOTAL HIP ARTHROPLASTY ANTERIOR APPROACH;  Surgeon: AluisiGaynelle Arabian Location: [facility];  Service: Orthopedics;  Laterality: Left;   TOTAL HIP ARTHROPLASTY Right 11/20/2016   Procedure: RIGHT TOTAL HIP ARTHROPLASTY ANTERIOR APPROACH;  Surgeon: AluisiGaynelle Arabian Location:  [facility];  Service: Orthopedics;  Laterality: Right;   TUBAL LIGATION  1972    Family History  Problem Relation Age of Onset   Kidney disease Mother        kidney failure    Kidney failure Mother    Stroke Mother    Heart disease Father        heart attack    Heart attack Father    Lymphoma Sister    Pancreatitis Sister     Social History   Socioeconomic History   Marital status: Married    Spouse name: Not on file   Number of children: 3   Years of education: 12   Highest education level: Not on file  Occupational History   Occupation: Waitress    Comment: [employer]  Tobacco Use   Smoking  status: Never   Smokeless tobacco: Never  Substance and Sexual Activity   Alcohol use: No    Alcohol/week: 0.0 standard drinks    Comment: Rarely/On Special Occasions   Drug use: No   Sexual activity: Not on file    Comment: G3, P3, MENARCHE, AGE 85, MENOPAUSE AGE 72, NO BC AND HRT X 10 YEARS  Other Topics Concern   Not on file  Social History Narrative   Married for 65 years      3 children, one deceased   42 grand-children, 3 great-grandchildren   Social Determinants of Health   Financial Resource Strain: Not on file  Food Insecurity: Not on file  Transportation Needs: Not on file  Physical Activity: Not on file  Stress: Not on file  Social Connections: Not on file  Intimate Partner Violence: Not on file    Outpatient Medications Prior to Visit  Medication Sig Dispense Refill   amLODipine (NORVASC) 10 MG tablet TAKE 1 TABLET(10 MG) BY MOUTH DAILY 90 tablet 0   chlorthalidone (HYGROTON) 25 MG tablet TAKE 1 TABLET(25 MG) BY MOUTH DAILY 90 tablet 1   fluticasone (FLONASE) 50 MCG/ACT nasal spray Place 1 spray into both nostrils daily. 16 g 6   levothyroxine (SYNTHROID) 50 MCG tablet TAKE 1 TABLET(50 MCG) BY MOUTH DAILY BEFORE BREAKFAST 90 tablet 2   lisinopril (ZESTRIL) 5 MG tablet TAKE 1/2 TABLET(2.5 MG) BY MOUTH DAILY 45 tablet 0   simvastatin (ZOCOR) 20 MG tablet TAKE 1 TO 2 TABLETS(20 TO 40 MG) BY MOUTH DAILY AT 6 PM 90 tablet 1   metroNIDAZOLE (METROGEL) 0.75 % gel Apply 1 application topically 2 (two) times daily as needed. (Patient not taking: Reported on 10/02/2020) 45 g 0   No facility-administered medications prior to visit.    Allergies  Allergen Reactions   Xarelto [Rivaroxaban]     ROS Review of Systems  Constitutional: Negative.   HENT: Negative.    Eyes: Negative.   Respiratory: Negative.    Cardiovascular: Negative.   Gastrointestinal: Negative.   Genitourinary: Negative.   Musculoskeletal: Negative.   Skin: Negative.   Neurological: Negative.    Psychiatric/Behavioral: Negative.    All other systems reviewed and are negative.    Objective:    Physical Exam Vitals and nursing note reviewed.  Constitutional:      General: She is not in acute distress.    Appearance: Normal appearance. She is normal weight. She is not ill-appearing, toxic-appearing or diaphoretic.  Cardiovascular:     Rate and Rhythm: Normal rate and regular rhythm.     Heart sounds: Normal heart sounds. No murmur heard.   No  friction rub. No gallop.  Pulmonary:     Effort: Pulmonary effort is normal. No respiratory distress.     Breath sounds: Normal breath sounds. No stridor. No wheezing, rhonchi or rales.  Chest:     Chest wall: No tenderness.  Skin:    General: Skin is warm and dry.  Neurological:     General: No focal deficit present.     Mental Status: She is alert and oriented to person, place, and time. Mental status is at baseline.  Psychiatric:        Mood and Affect: Mood normal.        Behavior: Behavior normal.        Thought Content: Thought content normal.        Judgment: Judgment normal.    BP (!) 150/61   Pulse 73   Temp 98.2 F (36.8 C) (Temporal)   Resp 18   Ht '5\' 4"'  (1.626 m)   Wt 165 lb (74.8 kg)   SpO2 98%   BMI 28.32 kg/m  Wt Readings from Last 3 Encounters:  10/02/20 165 lb (74.8 kg)  07/30/19 167 lb 9.6 oz (76 kg)  07/20/19 164 lb (74.4 kg)     Health Maintenance Due  Topic Date Due   INFLUENZA VACCINE  09/25/2020    There are no preventive care reminders to display for this patient.  Lab Results  Component Value Date   TSH 1.080 06/28/2019   Lab Results  Component Value Date   WBC 12.0 (H) 11/21/2016   HGB 10.8 (L) 11/21/2016   HCT 31.6 (L) 11/21/2016   MCV 87.3 11/21/2016   PLT 277 11/21/2016   Lab Results  Component Value Date   NA 139 09/22/2019   K 3.4 (L) 09/22/2019   CHLORIDE 101 04/16/2016   CO2 21 09/22/2019   GLUCOSE 103 (H) 09/22/2019   BUN 22 09/22/2019   CREATININE 0.88  09/22/2019   BILITOT 0.3 06/28/2019   ALKPHOS 92 06/28/2019   AST 15 06/28/2019   ALT 16 06/28/2019   PROT 7.0 06/28/2019   ALBUMIN 4.3 06/28/2019   CALCIUM 9.7 09/22/2019   ANIONGAP 9 11/21/2016   EGFR 65 (L) 04/16/2016   Lab Results  Component Value Date   CHOL 287 (H) 06/28/2019   Lab Results  Component Value Date   HDL 64 06/28/2019   Lab Results  Component Value Date   LDLCALC 187 (H) 06/28/2019   Lab Results  Component Value Date   TRIG 193 (H) 06/28/2019   Lab Results  Component Value Date   CHOLHDL 4.5 (H) 06/28/2019   Lab Results  Component Value Date   HGBA1C 6.1 (H) 02/29/2016      Assessment & Plan:   Problem List Items Addressed This Visit       Endocrine   Hypothyroidism   Relevant Medications   levothyroxine (SYNTHROID) 50 MCG tablet   Other Relevant Orders   TSH     Other   Hypercholesteremia   Relevant Medications   amLODipine (NORVASC) 10 MG tablet   chlorthalidone (HYGROTON) 25 MG tablet   lisinopril (ZESTRIL) 5 MG tablet   simvastatin (ZOCOR) 20 MG tablet   Other Relevant Orders   Lipid panel   Other Visit Diagnoses     Essential hypertension    -  Primary   Relevant Medications   amLODipine (NORVASC) 10 MG tablet   chlorthalidone (HYGROTON) 25 MG tablet   lisinopril (ZESTRIL) 5 MG tablet   simvastatin (ZOCOR) 20  MG tablet   Other Relevant Orders   Comprehensive metabolic panel   CBC with Differential/Platelet   Nasal congestion       Relevant Medications   fluticasone (FLONASE) 50 MCG/ACT nasal spray   Chronic right shoulder pain       Relevant Orders   DG Shoulder Right       Meds ordered this encounter  Medications   amLODipine (NORVASC) 10 MG tablet    Sig: TAKE 1 TABLET(10 MG) BY MOUTH DAILY    Dispense:  90 tablet    Refill:  1    Order Specific Question:   Supervising Provider    Answer:   Carlota Raspberry, JEFFREY R [[id number]]   chlorthalidone (HYGROTON) 25 MG tablet    Sig: TAKE 1 TABLET(25 MG) BY MOUTH DAILY     Dispense:  90 tablet    Refill:  1    Order Specific Question:   Supervising Provider    Answer:   Carlota Raspberry, JEFFREY R [[id number]]   fluticasone (FLONASE) 50 MCG/ACT nasal spray    Sig: Place 1 spray into both nostrils daily.    Dispense:  16 g    Refill:  6    Order Specific Question:   Supervising Provider    Answer:   Carlota Raspberry, JEFFREY R [[id number]]   levothyroxine (SYNTHROID) 50 MCG tablet    Sig: TAKE 1 TABLET(50 MCG) BY MOUTH DAILY BEFORE BREAKFAST    Dispense:  90 tablet    Refill:  1    Order Specific Question:   Supervising Provider    Answer:   Carlota Raspberry, JEFFREY R [[id number]]   lisinopril (ZESTRIL) 5 MG tablet    Sig: TAKE 1/2 TABLET(2.5 MG) BY MOUTH DAILY    Dispense:  90 tablet    Refill:  1    Order Specific Question:   Supervising Provider    Answer:   Carlota Raspberry, JEFFREY R [[id number]]   simvastatin (ZOCOR) 20 MG tablet    Sig: TAKE 1 TO 2 TABLETS(20 TO 40 MG) BY MOUTH DAILY AT 6 PM    Dispense:  90 tablet    Refill:  1    Order Specific Question:   Supervising Provider    Answer:   Carlota Raspberry, JEFFREY R [[id number]]    Follow-up: Return in about 6 months (around 04/04/2021) for HTN, HLD, thyroid follow up.   Doing well overall. Blood pressure borderlien but permissible, states she has been out of amlodipine, should correct when resumed. Continue to monitor at home Refill meds as above Labs collected. Will follow up with the patient as warranted. Follow up in 6 mo with PCP or myself Recommend aspirin at her own discretion, reviewed r/b/se of asa given her history and I do not think 59m daily dose would provide any significant benefit or risk. Patient encouraged to call clinic with any questions, comments, or concerns.  RMaximiano Coss NP

## 2020-10-03 LAB — COMPREHENSIVE METABOLIC PANEL
ALT: 13 U/L (ref 0–35)
AST: 15 U/L (ref 0–37)
Albumin: 3.9 g/dL (ref 3.5–5.2)
Alkaline Phosphatase: 83 U/L (ref 39–117)
BUN: 22 mg/dL (ref 6–23)
CO2: 28 mEq/L (ref 19–32)
Calcium: 9.2 mg/dL (ref 8.4–10.5)
Chloride: 102 mEq/L (ref 96–112)
Creatinine, Ser: 0.96 mg/dL (ref 0.40–1.20)
GFR: 54.31 mL/min — ABNORMAL LOW (ref >60.00)
Glucose, Bld: 118 mg/dL — ABNORMAL HIGH (ref 70–99)
Potassium: 3.3 mEq/L — ABNORMAL LOW (ref 3.5–5.1)
Sodium: 140 mEq/L (ref 135–145)
Total Bilirubin: 0.4 mg/dL (ref 0.2–1.2)
Total Protein: 6.4 g/dL (ref 6.0–8.3)

## 2020-10-03 LAB — TSH: TSH: 0.98 u[IU]/mL (ref 0.35–5.50)

## 2020-10-03 LAB — CBC WITH DIFFERENTIAL/PLATELET
Basophils Absolute: 0.1 10*3/uL (ref 0.0–0.1)
Basophils Relative: 0.9 % (ref 0.0–3.0)
Eosinophils Absolute: 0.1 10*3/uL (ref 0.0–0.7)
Eosinophils Relative: 2.3 % (ref 0.0–5.0)
HCT: 37.6 % (ref 36.0–46.0)
Hemoglobin: 12.3 g/dL (ref 12.0–15.0)
Lymphocytes Relative: 26.3 % (ref 12.0–46.0)
Lymphs Abs: 1.6 10*3/uL (ref 0.7–4.0)
MCHC: 32.6 g/dL (ref 30.0–36.0)
MCV: 93.2 fl (ref 78.0–100.0)
Monocytes Absolute: 0.4 10*3/uL (ref 0.1–1.0)
Monocytes Relative: 6.7 % (ref 3.0–12.0)
Neutro Abs: 3.9 10*3/uL (ref 1.4–7.7)
Neutrophils Relative %: 63.8 % (ref 43.0–77.0)
Platelets: 289 10*3/uL (ref 150.0–400.0)
RBC: 4.03 Mil/uL (ref 3.87–5.11)
RDW: 14.5 % (ref 11.5–15.5)
WBC: 6.1 10*3/uL (ref 4.0–10.5)

## 2020-10-03 LAB — LDL CHOLESTEROL, DIRECT: Direct LDL: 113 mg/dL

## 2020-10-03 LAB — LIPID PANEL
Cholesterol: 204 mg/dL — ABNORMAL HIGH (ref 0–200)
HDL: 59.7 mg/dL (ref >39.00)
NonHDL: 144.3
Total CHOL/HDL Ratio: 3
Triglycerides: 239 mg/dL — ABNORMAL HIGH (ref 0.0–149.0)
VLDL: 47.8 mg/dL — ABNORMAL HIGH (ref 0.0–40.0)

## 2020-10-04 ENCOUNTER — Telehealth: Payer: Self-pay | Admitting: Family Medicine

## 2020-10-04 ENCOUNTER — Ambulatory Visit
Admission: RE | Admit: 2020-10-04 | Discharge: 2020-10-04 | Disposition: A | Discharge status: Home / Self Care | Payer: Medicare Other | Source: Ambulatory Visit | Attending: Registered Nurse | Admitting: Registered Nurse

## 2020-10-04 DIAGNOSIS — M19011 Primary osteoarthritis, right shoulder: Secondary | ICD-10-CM | POA: Diagnosis not present

## 2020-10-04 DIAGNOSIS — G8929 Other chronic pain: Secondary | ICD-10-CM | POA: Diagnosis not present

## 2020-10-04 DIAGNOSIS — M25511 Pain in right shoulder: Secondary | ICD-10-CM

## 2020-10-04 NOTE — Telephone Encounter (Signed)
Patient would like someone to call her back.  Patient states that she was supposed to be set up for an xray.   Please advise.

## 2020-10-04 NOTE — Telephone Encounter (Signed)
Called patient and gave her the information about [facility] -

## 2020-10-04 NOTE — Telephone Encounter (Signed)
Called patient but unable to leave a voicemail message due to her mailbox not being set up. If patient calls back, please inform her per pcp: Xray ordered to [place] at [facility]. They can generally accept walk ins, but you can call them if you'd prefer: [phone]. Otherwise, they will call you

## 2020-10-19 ENCOUNTER — Encounter: Payer: Self-pay | Admitting: Registered Nurse

## 2020-10-19 NOTE — Progress Notes (Signed)
Attempted to call patient to go over her Lab results. Patient phone states the call can not be completed at this time. I'm also mailing a copy to the patient.

## 2020-11-23 ENCOUNTER — Other Ambulatory Visit: Payer: Self-pay | Admitting: Family Medicine

## 2020-11-23 DIAGNOSIS — I10 Essential (primary) hypertension: Secondary | ICD-10-CM

## 2021-01-03 ENCOUNTER — Telehealth: Payer: Self-pay | Admitting: Family Medicine

## 2021-01-03 NOTE — Telephone Encounter (Signed)
Left message for patient to call back and schedule Medicare Annual Wellness Visit (AWV) in office.   If not able to come in office, please offer to do virtually or by telephone.  Left office number and my jabber [phone].  Last AWV:02/29/2016  Please schedule at anytime with Nurse Health Advisor.

## 2021-02-01 ENCOUNTER — Telehealth: Payer: Self-pay

## 2021-02-01 NOTE — Telephone Encounter (Signed)
Fax from pharmacy stating pt has discontinued use for Simvastatin due to myalgias, once stopped myalgias resolved.   Pt willing to try alternative therapy if advised so

## 2021-02-02 ENCOUNTER — Other Ambulatory Visit: Payer: Self-pay | Admitting: Registered Nurse

## 2021-02-02 DIAGNOSIS — E7800 Pure hypercholesterolemia, unspecified: Secondary | ICD-10-CM

## 2021-02-02 NOTE — Telephone Encounter (Signed)
Looks like she will follow up with Carlota Raspberry in February - in the interim, she should pursue healthy diet and regular exercise as tolerated.  Thank you  Rich

## 2021-03-29 ENCOUNTER — Other Ambulatory Visit: Payer: Self-pay | Admitting: Registered Nurse

## 2021-03-29 DIAGNOSIS — I10 Essential (primary) hypertension: Secondary | ICD-10-CM

## 2021-04-03 ENCOUNTER — Other Ambulatory Visit: Payer: Self-pay | Admitting: Registered Nurse

## 2021-04-03 DIAGNOSIS — E039 Hypothyroidism, unspecified: Secondary | ICD-10-CM

## 2021-04-04 ENCOUNTER — Ambulatory Visit: Payer: Medicare Other | Admitting: Family Medicine

## 2021-04-05 ENCOUNTER — Ambulatory Visit (INDEPENDENT_AMBULATORY_CARE_PROVIDER_SITE_OTHER): Payer: Medicare Other | Admitting: Family Medicine

## 2021-04-05 ENCOUNTER — Other Ambulatory Visit: Payer: Self-pay

## 2021-04-05 ENCOUNTER — Encounter: Payer: Self-pay | Admitting: Family Medicine

## 2021-04-05 VITALS — BP 108/60 | HR 73 | Temp 98.2°F | Resp 16 | Ht 64.0 in | Wt 164.2 lb

## 2021-04-05 DIAGNOSIS — E7800 Pure hypercholesterolemia, unspecified: Secondary | ICD-10-CM

## 2021-04-05 DIAGNOSIS — I10 Essential (primary) hypertension: Secondary | ICD-10-CM

## 2021-04-05 DIAGNOSIS — E039 Hypothyroidism, unspecified: Secondary | ICD-10-CM | POA: Diagnosis not present

## 2021-04-05 LAB — LIPID PANEL
Cholesterol: 181 mg/dL (ref 0–200)
HDL: 61.3 mg/dL (ref >39.00)
LDL Cholesterol: 89 mg/dL (ref 0–99)
NonHDL: 119.71
Total CHOL/HDL Ratio: 3
Triglycerides: 152 mg/dL — ABNORMAL HIGH (ref 0.0–149.0)
VLDL: 30.4 mg/dL (ref 0.0–40.0)

## 2021-04-05 LAB — COMPREHENSIVE METABOLIC PANEL
ALT: 14 U/L (ref 0–35)
AST: 15 U/L (ref 0–37)
Albumin: 3.9 g/dL (ref 3.5–5.2)
Alkaline Phosphatase: 77 U/L (ref 39–117)
BUN: 23 mg/dL (ref 6–23)
CO2: 29 mEq/L (ref 19–32)
Calcium: 9.8 mg/dL (ref 8.4–10.5)
Chloride: 101 mEq/L (ref 96–112)
Creatinine, Ser: 0.93 mg/dL (ref 0.40–1.20)
GFR: 56.22 mL/min — ABNORMAL LOW (ref >60.00)
Glucose, Bld: 96 mg/dL (ref 70–99)
Potassium: 4 mEq/L (ref 3.5–5.1)
Sodium: 138 mEq/L (ref 135–145)
Total Bilirubin: 0.5 mg/dL (ref 0.2–1.2)
Total Protein: 6.7 g/dL (ref 6.0–8.3)

## 2021-04-05 LAB — TSH: TSH: 0.8 u[IU]/mL (ref 0.35–5.50)

## 2021-04-05 MED ORDER — LEVOTHYROXINE SODIUM 50 MCG PO TABS: ORAL_TABLET | ORAL | 2 refills | Status: DC

## 2021-04-05 MED ORDER — LISINOPRIL 5 MG PO TABS: ORAL_TABLET | ORAL | 2 refills | Status: DC

## 2021-04-05 MED ORDER — CHLORTHALIDONE 25 MG PO TABS: ORAL_TABLET | ORAL | 2 refills | Status: DC

## 2021-04-05 MED ORDER — SIMVASTATIN 20 MG PO TABS: ORAL_TABLET | ORAL | 2 refills | Status: DC

## 2021-04-05 MED ORDER — AMLODIPINE BESYLATE 10 MG PO TABS: ORAL_TABLET | ORAL | 2 refills | Status: DC

## 2021-04-05 NOTE — Patient Instructions (Addendum)
I do recommend updated covid vaccine booster.  No med changes today. Thanks for coming in today and take care.   If you have lab work done today you will be contacted with your lab results within the next 2 weeks.  If you have not heard from [place] then please contact us. The fastest way to get your results is to register for My Chart.   IF you received an x-ray today, you will receive an invoice from [facility]. Please contact [facility] at [phone] with questions or concerns regarding your invoice.   IF you received labwork today, you will receive an invoice from [place]. Please contact [facility] at [phone] with questions or concerns regarding your invoice.   Our billing staff will not be able to assist you with questions regarding bills from these companies.  You will be contacted with the lab results as soon as they are available. The fastest way to get your results is to activate your My Chart account. Instructions are located on the last page of this paperwork. If you have not heard from [place] regarding the results in 2 weeks, please contact this office.

## 2021-04-05 NOTE — Progress Notes (Signed)
Subjective:  Patient ID: Molly Lambert, female    DOB: [birth year]  Age: 87 y.o. MRN: [id number]  CC:  Chief Complaint  Patient presents with   Follow-up    Patient states she is here for a 6 month follow up. And medication refill on Levothyroxine    HPI Molly Lambert presents for  Hypertension: Amlodipine 10mg , lisinopril for HTN. No new side effects.  Home readings: [id number] systolic. No regular lightheadedness.  No nsaids, drinking fluids. Tylenol for shoulder at times. Min ankle swelling.  Son staying with her and helping with things at home, dtr also provides help if needed.  BP Readings from Last 3 Encounters:  04/05/21 108/60  10/02/20 (!) 144/72  07/30/19 (!) 155/73   Lab Results  Component Value Date   CREATININE 0.96 10/02/2020     Hyperlipidemia: Zocor 20mg  qd, no side effects or new myalgias.  Lab Results  Component Value Date   CHOL 204 (H) 10/02/2020   HDL 59.70 10/02/2020   LDLCALC 187 (H) 06/28/2019   LDLDIRECT 113.0 10/02/2020   TRIG 239.0 (H) 10/02/2020   CHOLHDL 3 10/02/2020   Lab Results  Component Value Date   ALT 13 10/02/2020   AST 15 10/02/2020   ALKPHOS 83 10/02/2020   BILITOT 0.4 10/02/2020    Hypothyroidism: Lab Results  Component Value Date   TSH 0.98 10/02/2020   Taking medication daily. Synthroid 9mcg qd.  No new hot or cold intolerance. No new hair or skin changes, heart palpitations or new fatigue. No new weight changes.    HM: Declines flu vaccine Covid booster - bivalent vaccine recommended   History Patient Active Problem List   Diagnosis Date Noted   OA (osteoarthritis) of hip 07/17/2016   Breast microcalcifications 12/27/2011   History of breast cancer 12/27/2011   Hypothyroidism 08/25/2011   Hypercholesteremia 08/25/2011   HTN (hypertension) 08/25/2011   Malignant neoplasm of central portion of right breast in female, estrogen receptor positive ([place]) 02/27/2011   Breast mass, right 11/28/2010   Past Medical  History:  Diagnosis Date   Arthritis    Breast cancer ([place]) 11/2010   Rt breast   Cataract    Erythema    reddened area of epidermis spnning concentrated around sinuses on the face; pt reports cracking, itchiness, and flaking  of affected skin; seen derm no absolute dx, will get 2nd opinion    Hypercholesteremia    Hypertension    NO CARDIAC MD,  [facility]  ,  URGENT [facility]   Hypothyroidism    Leg laceration    WITH SURGERY   Paget's carcinoma of the nipple, right ([place])    nipple removed   Personal history of radiation therapy    S/P radiation therapy 03/14/11 - 04/05/11   Right Breast / 4256 cGy in 16 Fractions   Thyroid disease    Past Surgical History:  Procedure Laterality Date   BREAST BIOPSY     BREAST LUMPECTOMY  01/22/11   RIGHT BREAST LUMPECTOMY WITH BIOSPY OF 1 SENTINEL NODE, INVASIVE GRADE II DUCTAL CARCINOMA , INTERMEDIATE GRADE DUCTAL CARCINOMA IN SITU , DERMAL SKIN INVOLVED, MARGINS NEGATIVE,( 0/1)  NODE POSITIVE, ER+, PR+, LOW s-PHASE, HER 2 NEU- NO AMPLIFICATION   BREAST SURGERY  11/28/10   SKIN-PUNCH BIOSPY RIGHT BREAST(NIPPLE), ER+, RP+, LOW S-PHASE, HER 2NEU NEGAITIVE   EYE SURGERY      BILATERAL CATARACT SURGERY   JOINT REPLACEMENT     left hip   PARTIAL MASTECTOMY WITH  NEEDLE LOCALIZATION  01/10/2012   Procedure: PARTIAL MASTECTOMY WITH NEEDLE LOCALIZATION;  Surgeon: Marcello Moores A. Cornett, MD;  Location: [facility];  Service: General;  Laterality: Right;  right breast needle localized partial mastectomy and removal of nipple    TOTAL HIP ARTHROPLASTY Left 07/17/2016   Procedure: LEFT TOTAL HIP ARTHROPLASTY ANTERIOR APPROACH;  Surgeon: Gaynelle Arabian, MD;  Location: [facility];  Service: Orthopedics;  Laterality: Left;   TOTAL HIP ARTHROPLASTY Right 11/20/2016   Procedure: RIGHT TOTAL HIP ARTHROPLASTY ANTERIOR APPROACH;  Surgeon: Gaynelle Arabian, MD;  Location: [facility];  Service: Orthopedics;  Laterality: Right;   TUBAL LIGATION  1972   Allergies   Allergen Reactions   Xarelto [Rivaroxaban]    Prior to Admission medications   Medication Sig Start Date End Date Taking? Authorizing Provider  amLODipine (NORVASC) 10 MG tablet TAKE 1 TABLET(10 MG) BY MOUTH DAILY 03/29/21  Yes Maximiano Coss, NP  chlorthalidone (HYGROTON) 25 MG tablet TAKE 1 TABLET(25 MG) BY MOUTH DAILY 11/24/20  Yes Wendie Agreste, MD  fluticasone (FLONASE) 50 MCG/ACT nasal spray Place 1 spray into both nostrils daily. 10/02/20  Yes Maximiano Coss, NP  levothyroxine (SYNTHROID) 50 MCG tablet TAKE 1 TABLET(50 MCG) BY MOUTH DAILY BEFORE BREAKFAST 04/03/21  Yes Maximiano Coss, NP  lisinopril (ZESTRIL) 5 MG tablet TAKE 1/2 TABLET(2.5 MG) BY MOUTH DAILY 10/02/20  Yes Maximiano Coss, NP  simvastatin (ZOCOR) 20 MG tablet TAKE 1 TO 2 TABLETS(20 TO 40 MG) BY MOUTH DAILY AT 6 PM 10/02/20  Yes Maximiano Coss, NP   Social History   Socioeconomic History   Marital status: Married    Spouse name: Not on file   Number of children: 3   Years of education: 12   Highest education level: Not on file  Occupational History   Occupation: Waitress    Comment: [employer]  Tobacco Use   Smoking status: Never   Smokeless tobacco: Never  Substance and Sexual Activity   Alcohol use: No    Alcohol/week: 0.0 standard drinks    Comment: Rarely/On Special Occasions   Drug use: No   Sexual activity: Not on file    Comment: G3, P3, MENARCHE, AGE 73, MENOPAUSE AGE 76, NO BC AND HRT X 10 YEARS  Other Topics Concern   Not on file  Social History Narrative   Married for 10 years      3 children, one deceased   28 grand-children, 3 great-grandchildren   Social Determinants of Health   Financial Resource Strain: Not on file  Food Insecurity: Not on file  Transportation Needs: Not on file  Physical Activity: Not on file  Stress: Not on file  Social Connections: Not on file  Intimate Partner Violence: Not on file    Review of Systems  Constitutional:  Negative for fatigue and  unexpected weight change.  Respiratory:  Negative for chest tightness and shortness of breath.   Cardiovascular:  Negative for chest pain, palpitations and leg swelling.  Gastrointestinal:  Negative for abdominal pain and blood in stool.  Neurological:  Negative for dizziness, syncope, light-headedness and headaches.    Objective:   Vitals:   04/05/21 1302  BP: 108/60  Pulse: 73  Resp: 16  Temp: 98.2 F (36.8 C)  TempSrc: Temporal  SpO2: 98%  Weight: 164 lb 3.2 oz (74.5 kg)  Height: 5\' 4"  (1.626 m)     Physical Exam Vitals reviewed.  Constitutional:      Appearance: Normal appearance. She is well-developed.  HENT:     Head: Normocephalic and atraumatic.  Eyes:     Conjunctiva/sclera: Conjunctivae normal.     Pupils: Pupils are equal, round, and reactive to light.  Neck:     Vascular: No carotid bruit.  Cardiovascular:     Rate and Rhythm: Normal rate and regular rhythm.     Heart sounds: Normal heart sounds.  Pulmonary:     Effort: Pulmonary effort is normal.     Breath sounds: Normal breath sounds.  Abdominal:     Palpations: Abdomen is soft. There is no pulsatile mass.     Tenderness: There is no abdominal tenderness.  Musculoskeletal:     Right lower leg: No edema.     Left lower leg: No edema.  Skin:    General: Skin is warm and dry.  Neurological:     Mental Status: She is alert and oriented to person, place, and time.  Psychiatric:        Mood and Affect: Mood normal.        Behavior: Behavior normal.    Assessment & Plan:  Molly Lambert is a 87 y.o. female . Essential hypertension - Plan: amLODipine (NORVASC) 10 MG tablet, chlorthalidone (HYGROTON) 25 MG tablet, lisinopril (ZESTRIL) 5 MG tablet, Comprehensive metabolic panel  -  Stable, tolerating current regimen. Medications refilled. Labs pending as above.  Borderline BP in office but higher at home.   Hypercholesteremia - Plan: simvastatin (ZOCOR) 20 MG tablet, Lipid panel  -No changes.  As  tolerating current regimen, check updated labs.  Hypothyroidism, unspecified type - Plan: levothyroxine (SYNTHROID) 50 MCG tablet, TSH  Stable, tolerating current regimen. Medications refilled. Labs pending as above.   Declined flu vaccine, covid bivalent booster recommended.   Meds ordered this encounter  Medications   amLODipine (NORVASC) 10 MG tablet    Sig: TAKE 1 TABLET(10 MG) BY MOUTH DAILY    Dispense:  90 tablet    Refill:  2   chlorthalidone (HYGROTON) 25 MG tablet    Sig: TAKE 1 TABLET(25 MG) BY MOUTH DAILY    Dispense:  90 tablet    Refill:  2   levothyroxine (SYNTHROID) 50 MCG tablet    Sig: TAKE 1 TABLET(50 MCG) BY MOUTH DAILY BEFORE BREAKFAST    Dispense:  90 tablet    Refill:  2   lisinopril (ZESTRIL) 5 MG tablet    Sig: TAKE 1/2 TABLET(2.5 MG) BY MOUTH DAILY    Dispense:  90 tablet    Refill:  2   simvastatin (ZOCOR) 20 MG tablet    Sig: TAKE 1 TO 2 TABLETS(20 TO 40 MG) BY MOUTH DAILY AT 6 PM    Dispense:  90 tablet    Refill:  2   Patient Instructions  I do recommend updated covid vaccine booster.  No med changes today. Thanks for coming in today and take care.   If you have lab work done today you will be contacted with your lab results within the next 2 weeks.  If you have not heard from [place] then please contact us. The fastest way to get your results is to register for My Chart.   IF you received an x-ray today, you will receive an invoice from [facility]. Please contact [facility] at [phone] with questions or concerns regarding your invoice.   IF you received labwork today, you will receive an invoice from [facility]. Please contact [facility] at [phone] with questions or concerns regarding your invoice.   Our  billing staff will not be able to assist you with questions regarding bills from these companies.  You will be contacted with the lab results as soon as they are available. The fastest way to get your results is to activate  your My Chart account. Instructions are located on the last page of this paperwork. If you have not heard from [place] regarding the results in 2 weeks, please contact this office.        Signed,   [name], MD [facility] 04/05/21 1:45 PM

## 2021-10-03 ENCOUNTER — Ambulatory Visit (INDEPENDENT_AMBULATORY_CARE_PROVIDER_SITE_OTHER): Payer: Medicare Other | Admitting: Family Medicine

## 2021-10-03 ENCOUNTER — Encounter: Payer: Self-pay | Admitting: Family Medicine

## 2021-10-03 VITALS — BP 138/76 | HR 80 | Temp 97.9°F | Resp 16 | Ht 64.0 in | Wt 160.8 lb

## 2021-10-03 DIAGNOSIS — E7800 Pure hypercholesterolemia, unspecified: Secondary | ICD-10-CM

## 2021-10-03 DIAGNOSIS — M79641 Pain in right hand: Secondary | ICD-10-CM | POA: Diagnosis not present

## 2021-10-03 DIAGNOSIS — R739 Hyperglycemia, unspecified: Secondary | ICD-10-CM | POA: Diagnosis not present

## 2021-10-03 DIAGNOSIS — H6123 Impacted cerumen, bilateral: Secondary | ICD-10-CM | POA: Diagnosis not present

## 2021-10-03 DIAGNOSIS — M19042 Primary osteoarthritis, left hand: Secondary | ICD-10-CM

## 2021-10-03 DIAGNOSIS — M79642 Pain in left hand: Secondary | ICD-10-CM

## 2021-10-03 DIAGNOSIS — Z131 Encounter for screening for diabetes mellitus: Secondary | ICD-10-CM | POA: Diagnosis not present

## 2021-10-03 DIAGNOSIS — Z853 Personal history of malignant neoplasm of breast: Secondary | ICD-10-CM

## 2021-10-03 DIAGNOSIS — I10 Essential (primary) hypertension: Secondary | ICD-10-CM | POA: Diagnosis not present

## 2021-10-03 DIAGNOSIS — H9193 Unspecified hearing loss, bilateral: Secondary | ICD-10-CM

## 2021-10-03 DIAGNOSIS — Z0000 Encounter for general adult medical examination without abnormal findings: Secondary | ICD-10-CM

## 2021-10-03 DIAGNOSIS — M8580 Other specified disorders of bone density and structure, unspecified site: Secondary | ICD-10-CM | POA: Diagnosis not present

## 2021-10-03 DIAGNOSIS — E039 Hypothyroidism, unspecified: Secondary | ICD-10-CM

## 2021-10-03 DIAGNOSIS — Z1231 Encounter for screening mammogram for malignant neoplasm of breast: Secondary | ICD-10-CM | POA: Diagnosis not present

## 2021-10-03 DIAGNOSIS — M19041 Primary osteoarthritis, right hand: Secondary | ICD-10-CM

## 2021-10-03 LAB — LIPID PANEL
Cholesterol: 230 mg/dL — ABNORMAL HIGH (ref 0–200)
HDL: 61.6 mg/dL (ref >39.00)
LDL Cholesterol: 133 mg/dL — ABNORMAL HIGH (ref 0–99)
NonHDL: 168.65
Total CHOL/HDL Ratio: 4
Triglycerides: 176 mg/dL — ABNORMAL HIGH (ref 0.0–149.0)
VLDL: 35.2 mg/dL (ref 0.0–40.0)

## 2021-10-03 LAB — COMPREHENSIVE METABOLIC PANEL
ALT: 14 U/L (ref 0–35)
AST: 19 U/L (ref 0–37)
Albumin: 4.2 g/dL (ref 3.5–5.2)
Alkaline Phosphatase: 83 U/L (ref 39–117)
BUN: 22 mg/dL (ref 6–23)
CO2: 25 mEq/L (ref 19–32)
Calcium: 9.4 mg/dL (ref 8.4–10.5)
Chloride: 100 mEq/L (ref 96–112)
Creatinine, Ser: 0.88 mg/dL (ref 0.40–1.20)
GFR: 59.87 mL/min — ABNORMAL LOW (ref >60.00)
Glucose, Bld: 100 mg/dL — ABNORMAL HIGH (ref 70–99)
Potassium: 3.8 mEq/L (ref 3.5–5.1)
Sodium: 137 mEq/L (ref 135–145)
Total Bilirubin: 0.6 mg/dL (ref 0.2–1.2)
Total Protein: 7.2 g/dL (ref 6.0–8.3)

## 2021-10-03 LAB — HEMOGLOBIN A1C: Hgb A1c MFr Bld: 6.5 % (ref 4.6–6.5)

## 2021-10-03 LAB — TSH: TSH: 1.58 u[IU]/mL (ref 0.35–5.50)

## 2021-10-03 MED ORDER — LEVOTHYROXINE SODIUM 50 MCG PO TABS: ORAL_TABLET | ORAL | 2 refills | Status: DC

## 2021-10-03 MED ORDER — SIMVASTATIN 20 MG PO TABS: ORAL_TABLET | ORAL | 2 refills | Status: DC

## 2021-10-03 MED ORDER — CHLORTHALIDONE 25 MG PO TABS: ORAL_TABLET | ORAL | 2 refills | Status: DC

## 2021-10-03 MED ORDER — LISINOPRIL 5 MG PO TABS: ORAL_TABLET | ORAL | 2 refills | Status: DC

## 2021-10-03 MED ORDER — AMLODIPINE BESYLATE 10 MG PO TABS: ORAL_TABLET | ORAL | 2 refills | Status: DC

## 2021-10-03 NOTE — Patient Instructions (Addendum)
You can try over the counter glucosamine chondroitin for arthritis. If no relief in 6 weeks, can stop.  Over the counter voltaren gel up to 4 times per day for hands for now and recheck in 6 weeks to see if [facility] specialist needed. Return to the clinic or go to the nearest emergency room if any of your symptoms worsen or new symptoms occur.  Please have covid booster at your pharmacy.  I will order mammogram and bone density Call your eye specialist for appointment.  Call your dentist to schedule follow up visit.  There is some excess cerumen or earwax on both sides but does not appear to be completely blocking the canal.  Try over-the-counter Debrox, prior to meeting with hearing specialist.  I have also placed that referral.   Preventive Care 65 Years and Older, Female Preventive care refers to lifestyle choices and visits with your health care provider that can promote health and wellness. Preventive care visits are also called wellness exams. What can I expect for my preventive care visit? Counseling Your health care provider may ask you questions about your: Medical history, including: Past medical problems. Family medical history. Pregnancy and menstrual history. History of falls. Current health, including: Memory and ability to understand (cognition). Emotional well-being. Home life and relationship well-being. Sexual activity and sexual health. Lifestyle, including: Alcohol, nicotine or tobacco, and drug use. Access to firearms. Diet, exercise, and sleep habits. Work and work environment. Sunscreen use. Safety issues such as seatbelt and bike helmet use. Physical exam Your health care provider will check your: Height and weight. These may be used to calculate your BMI (body mass index). BMI is a measurement that tells if you are at a healthy weight. Waist circumference. This measures the distance around your waistline. This measurement also tells if you are at a healthy  weight and may help predict your risk of certain diseases, such as type 2 diabetes and high blood pressure. Heart rate and blood pressure. Body temperature. Skin for abnormal spots. What immunizations do I need?  Vaccines are usually given at various ages, according to a schedule. Your health care provider will recommend vaccines for you based on your age, medical history, and lifestyle or other factors, such as travel or where you work. What tests do I need? Screening Your health care provider may recommend screening tests for certain conditions. This may include: Lipid and cholesterol levels. Hepatitis C test. Hepatitis B test. HIV (human immunodeficiency virus) test. STI (sexually transmitted infection) testing, if you are at risk. Lung cancer screening. Colorectal cancer screening. Diabetes screening. This is done by checking your blood sugar (glucose) after you have not eaten for a while (fasting). Mammogram. Talk with your health care provider about how often you should have regular mammograms. BRCA-related cancer screening. This may be done if you have a family history of breast, ovarian, tubal, or peritoneal cancers. Bone density scan. This is done to screen for osteoporosis. Talk with your health care provider about your test results, treatment options, and if necessary, the need for more tests. Follow these instructions at home: Eating and drinking  Eat a diet that includes fresh fruits and vegetables, whole grains, lean protein, and low-fat dairy products. Limit your intake of foods with high amounts of sugar, saturated fats, and salt. Take vitamin and mineral supplements as recommended by your health care provider. Do not drink alcohol if your health care provider tells you not to drink. If you drink alcohol: Limit how much you   have to 0-1 drink a day. Know how much alcohol is in your drink. In the [place] one drink equals one 12 oz bottle of beer (355 mL), one 5 oz glass of  wine (148 mL), or one 1 oz glass of hard liquor (44 mL). Lifestyle Brush your teeth every morning and night with fluoride toothpaste. Floss one time each day. Exercise for at least 30 minutes 5 or more days each week. Do not use any products that contain nicotine or tobacco. These products include cigarettes, chewing tobacco, and vaping devices, such as e-cigarettes. If you need help quitting, ask your health care provider. Do not use drugs. If you are sexually active, practice safe sex. Use a condom or other form of protection in order to prevent STIs. Take aspirin only as told by your health care provider. Make sure that you understand how much to take and what form to take. Work with your health care provider to find out whether it is safe and beneficial for you to take aspirin daily. Ask your health care provider if you need to take a cholesterol-lowering medicine (statin). Find healthy ways to manage stress, such as: Meditation, yoga, or listening to music. Journaling. Talking to a trusted person. Spending time with friends and family. Minimize exposure to UV radiation to reduce your risk of skin cancer. Safety Always wear your seat belt while driving or riding in a vehicle. Do not drive: If you have been drinking alcohol. Do not ride with someone who has been drinking. When you are tired or distracted. While texting. If you have been using any mind-altering substances or drugs. Wear a helmet and other protective equipment during sports activities. If you have firearms in your house, make sure you follow all gun safety procedures. What's next? Visit your health care provider once a year for an annual wellness visit. Ask your health care provider how often you should have your eyes and teeth checked. Stay up to date on all vaccines. This information is not intended to replace advice given to you by your health care provider. Make sure you discuss any questions you have with your  health care provider. Document Revised: 08/09/2020 Document Reviewed: 08/09/2020 Elsevier Patient Education  2023 Elsevier Inc. Earwax Buildup, Adult The ears produce a substance called earwax that helps keep bacteria out of the ear and protects the skin in the ear canal. Occasionally, earwax can build up in the ear and cause discomfort or hearing loss. What are the causes? This condition is caused by a buildup of earwax. Ear canals are self-cleaning. Ear wax is made in the outer part of the ear canal and generally falls out in small amounts over time. When the self-cleaning mechanism is not working, earwax builds up and can cause decreased hearing and discomfort. Attempting to clean ears with cotton swabs can push the earwax deep into the ear canal and cause decreased hearing and pain. What increases the risk? This condition is more likely to develop in people who: Clean their ears often with cotton swabs. Pick at their ears. Use earplugs or in-ear headphones often, or wear hearing aids. The following factors may also make you more likely to develop this condition: Being female. Being of older age. Naturally producing more earwax. Having narrow ear canals. Having earwax that is overly thick or sticky. Having excess hair in the ear canal. Having eczema. Being dehydrated. What are the signs or symptoms? Symptoms of this condition include: Reduced or muffled hearing. A feeling of fullness   in the ear or feeling that the ear is plugged. Fluid coming from the ear. Ear pain or an itchy ear. Ringing in the ear. Coughing. Balance problems. An obvious piece of earwax that can be seen inside the ear canal. How is this diagnosed? This condition may be diagnosed based on: Your symptoms. Your medical history. An ear exam. During the exam, your health care provider will look into your ear with an instrument called an otoscope. You may have tests, including a hearing test. How is this  treated? This condition may be treated by: Using ear drops to soften the earwax. Having the earwax removed by a health care provider. The health care provider may: Flush the ear with water. Use an instrument that has a loop on the end (curette). Use a suction device. Having surgery to remove the wax buildup. This may be done in severe cases. Follow these instructions at home:  Take over-the-counter and prescription medicines only as told by your health care provider. Do not put any objects, including cotton swabs, into your ear. You can clean the opening of your ear canal with a washcloth or facial tissue. Follow instructions from your health care provider about cleaning your ears. Do not overclean your ears. Drink enough fluid to keep your urine pale yellow. This will help to thin the earwax. Keep all follow-up visits as told. If earwax builds up in your ears often or if you use hearing aids, consider seeing your health care provider for routine, preventive ear cleanings. Ask your health care provider how often you should schedule your cleanings. If you have hearing aids, clean them according to instructions from the manufacturer and your health care provider. Contact a health care provider if: You have ear pain. You develop a fever. You have pus or other fluid coming from your ear. You have hearing loss. You have ringing in your ears that does not go away. You feel like the room is spinning (vertigo). Your symptoms do not improve with treatment. Get help right away if: You have bleeding from the affected ear. You have severe ear pain. Summary Earwax can build up in the ear and cause discomfort or hearing loss. The most common symptoms of this condition include reduced or muffled hearing, a feeling of fullness in the ear, or feeling that the ear is plugged. This condition may be diagnosed based on your symptoms, your medical history, and an ear exam. This condition may be treated by  using ear drops to soften the earwax or by having the earwax removed by a health care provider. Do not put any objects, including cotton swabs, into your ear. You can clean the opening of your ear canal with a washcloth or facial tissue. This information is not intended to replace advice given to you by your health care provider. Make sure you discuss any questions you have with your health care provider. Document Revised: 06/01/2019 Document Reviewed: 06/01/2019 Elsevier Patient Education  2023 Elsevier Inc.  

## 2021-10-03 NOTE — Progress Notes (Signed)
Subjective:  Patient ID: Molly Lambert, female    DOB: 08-Jan-1935  Age: 87 y.o. MRN: [id number]  CC:  Chief Complaint  Patient presents with   Annual Exam    HPI Molly Lambert presents for Annual Exam  No health changes other than some Pasha pains since last visit.   Care team, PCP, [facility], Dr. Jana Hakim prior, history of right breast cancer in 2012.  Treated with anastrozole prior. underwent  surgery, radiation therapy and antiestrogen therapy.  Plan for yearly mammography and clinical breast exam. Orthopedics, Dr. Wynelle Link, right total hip surgery 2018.  Bilateral Capek pain: Past 6 months to 1 year. No injury. Pain in PIP joints, some stinging pain at times. More enlarged joints in past year, with some turning in of some joints - multiple joints both sides. Tx: tylenol once per day, not every day. Rare ibuprofen. No supplements.   Hypertension: Treated with amlodipine 10 mg, lisinopril 2.5 mg, chlorthalidone 25 mg daily. Home readings: 130-140/70s. No new side effects of meds.  BP Readings from Last 3 Encounters:  10/03/21 138/76  04/05/21 108/60  10/02/20 (!) 144/72   Lab Results  Component Value Date   CREATININE 0.93 04/05/2021   Hypothyroidism: Lab Results  Component Value Date   TSH 0.80 04/05/2021  Synthroid 50 mcg daily Taking medication daily.  No new hot or cold intolerance. No new hair or skin changes, heart palpitations or new fatigue. No new weight changes.   Hyperlipidemia: simvastatin 20 mg QD, no new myalgias. Some arthritis in various areas.  Lab Results  Component Value Date   CHOL 181 04/05/2021   HDL 61.30 04/05/2021   LDLCALC 89 04/05/2021   LDLDIRECT 113.0 10/02/2020   TRIG 152.0 (H) 04/05/2021   CHOLHDL 3 04/05/2021   Lab Results  Component Value Date   ALT 14 04/05/2021   AST 15 04/05/2021   ALKPHOS 77 04/05/2021   BILITOT 0.5 04/05/2021   Hearing difficulty: Past year, daughter has noticed and recommended eval. Trouble with  both sides, worse with tv or background noise.        10/03/2021   10:15 AM 04/05/2021    1:01 PM 10/02/2020    2:25 PM 09/22/2019   11:02 AM 07/30/2019   10:07 AM  Depression screen PHQ 2/9  Decreased Interest 0 0 0 0 0  Down, Depressed, Hopeless 0 0 0 0 0  PHQ - 2 Score 0 0 0 0 0  Altered sleeping  0     Tired, decreased energy  0     Change in appetite  0     Feeling bad or failure about yourself   0     Trouble concentrating  0     Moving slowly or fidgety/restless  0     Suicidal thoughts  0     PHQ-9 Score  0     Difficult doing work/chores  Not difficult at all       Health Maintenance  Topic Date Due   MAMMOGRAM  04/18/2019   INFLUENZA VACCINE  09/25/2021   COVID-19 Vaccine (3 - Pfizer risk series) 10/19/2021 (Originally 08/19/2019)   Zoster Vaccines- Shingrix (1 of 2) 01/03/2022 (Originally 03/14/1953)   TETANUS/TDAP  10/04/2022 (Originally 07/21/2019)   Pneumonia Vaccine 51+ Years old  Completed   DEXA SCAN  Completed   HPV VACCINES  Aged Out  Overdue for mammogram. Hx of breast CA as above.  Bone density - overdue. Osteopenia in 2016.   Immunization  History  Administered Date(s) Administered   PFIZER(Purple Top)SARS-COV-2 Vaccination 07/01/2019, 07/22/2019   Pneumococcal Conjugate-13 02/29/2016   Pneumococcal Polysaccharide-23 08/23/2010   Tdap 07/20/2009  Covid bivalent booster recommended.   No results found. Optho visit overdue. Will schedule.   Dental: natural teeth. Overdue for follow up.   Alcohol: rare - occasional glass of wine, less than once per year.   Tobacco: none.  Exercise: gardening, mowing at times. Staying active.   HM: has living will and HCPOA - no changes.    History Patient Active Problem List   Diagnosis Date Noted   OA (osteoarthritis) of hip 07/17/2016   Breast microcalcifications 12/27/2011   History of breast cancer 12/27/2011   Hypothyroidism 08/25/2011   Hypercholesteremia 08/25/2011   HTN (hypertension) 08/25/2011    Malignant neoplasm of central portion of right breast in female, estrogen receptor positive ([place]) 02/27/2011   Breast mass, right 11/28/2010   Past Medical History:  Diagnosis Date   Arthritis    Breast cancer ([place]) 11/2010   Rt breast   Cataract    Erythema    reddened area of epidermis spnning concentrated around sinuses on the face; pt reports cracking, itchiness, and flaking  of affected skin; seen derm no absolute dx, will get 2nd opinion    Hypercholesteremia    Hypertension    NO CARDIAC MD,  [place]  PCP  ,  URGENT MED   Hypothyroidism    Leg laceration    WITH SURGERY   Paget's carcinoma of the nipple, right ([place])    nipple removed   Personal history of radiation therapy    S/P radiation therapy 03/14/11 - 04/05/11   Right Breast / 4256 cGy in 16 Fractions   Thyroid disease    Past Surgical History:  Procedure Laterality Date   BREAST BIOPSY     BREAST LUMPECTOMY  01/22/11   RIGHT BREAST LUMPECTOMY WITH BIOSPY OF 1 SENTINEL NODE, INVASIVE GRADE II DUCTAL CARCINOMA , INTERMEDIATE GRADE DUCTAL CARCINOMA IN SITU , DERMAL SKIN INVOLVED, MARGINS NEGATIVE,( 0/1)  NODE POSITIVE, ER+, PR+, LOW s-PHASE, HER 2 NEU- NO AMPLIFICATION   BREAST SURGERY  11/28/10   SKIN-PUNCH BIOSPY RIGHT BREAST(NIPPLE), ER+, RP+, LOW S-PHASE, HER 2NEU NEGAITIVE   EYE SURGERY      BILATERAL CATARACT SURGERY   JOINT REPLACEMENT     left hip   PARTIAL MASTECTOMY WITH NEEDLE LOCALIZATION  01/10/2012   Procedure: PARTIAL MASTECTOMY WITH NEEDLE LOCALIZATION;  Surgeon: Marcello Moores A. Cornett, MD;  Location: [facility];  Service: General;  Laterality: Right;  right breast needle localized partial mastectomy and removal of nipple    TOTAL HIP ARTHROPLASTY Left 07/17/2016   Procedure: LEFT TOTAL HIP ARTHROPLASTY ANTERIOR APPROACH;  Surgeon: Gaynelle Arabian, MD;  Location: [facility];  Service: Orthopedics;  Laterality: Left;   TOTAL HIP ARTHROPLASTY Right 11/20/2016   Procedure: RIGHT TOTAL HIP  ARTHROPLASTY ANTERIOR APPROACH;  Surgeon: Gaynelle Arabian, MD;  Location: [facility];  Service: Orthopedics;  Laterality: Right;   TUBAL LIGATION  1972   Allergies  Allergen Reactions   Xarelto [Rivaroxaban]    Prior to Admission medications   Medication Sig Start Date End Date Taking? Authorizing Provider  amLODipine (NORVASC) 10 MG tablet TAKE 1 TABLET(10 MG) BY MOUTH DAILY 04/05/21  Yes Wendie Agreste, MD  chlorthalidone (HYGROTON) 25 MG tablet TAKE 1 TABLET(25 MG) BY MOUTH DAILY 04/05/21  Yes Wendie Agreste, MD  levothyroxine (SYNTHROID) 50 MCG tablet TAKE 1 TABLET(50 MCG) BY MOUTH DAILY  BEFORE BREAKFAST 04/05/21  Yes Wendie Agreste, MD  lisinopril (ZESTRIL) 5 MG tablet TAKE 1/2 TABLET(2.5 MG) BY MOUTH DAILY 04/05/21  Yes Wendie Agreste, MD  simvastatin (ZOCOR) 20 MG tablet TAKE 1 TO 2 TABLETS(20 TO 40 MG) BY MOUTH DAILY AT 6 PM 04/05/21  Yes Wendie Agreste, MD  fluticasone [facility]) 50 MCG/ACT nasal spray Place 1 spray into both nostrils daily. 10/02/20   Maximiano Coss, NP   Social History   Socioeconomic History   Marital status: Married    Spouse name: Not on file   Number of children: 3   Years of education: 12   Highest education level: Not on file  Occupational History   Occupation: Waitress    Comment: [employer]  Tobacco Use   Smoking status: Never   Smokeless tobacco: Never  Substance and Sexual Activity   Alcohol use: No    Alcohol/week: 0.0 standard drinks of alcohol    Comment: Rarely/On Special Occasions   Drug use: No   Sexual activity: Not Currently    Comment: G3, P3, MENARCHE, AGE 35, MENOPAUSE AGE 44, NO BC AND HRT X 10 YEARS  Other Topics Concern   Not on file  Social History Narrative   Married for 1 years      3 children, one deceased   30 grand-children, 3 great-grandchildren   Social Determinants of Health   Financial Resource Strain: Not on file  Food Insecurity: Not on file  Transportation Needs: Not on file  Physical  Activity: Not on file  Stress: Not on file  Social Connections: Not on file  Intimate Partner Violence: Not on file    Review of Systems 13 point review of systems per patient health survey noted.  Negative other than as indicated above or in HPI.    Objective:   Vitals:   10/03/21 1012  BP: 138/76  Pulse: 80  Resp: 16  Temp: 97.9 F (36.6 C)  TempSrc: Oral  SpO2: 95%  Weight: 160 lb 12.8 oz (72.9 kg)  Height: '5\' 4"'$  (1.626 m)     Physical Exam Exam conducted with a chaperone present Noah Delaine).  Constitutional:      Appearance: She is well-developed.  HENT:     Head: Normocephalic and atraumatic.     Right Ear: External ear normal.     Left Ear: External ear normal.     Ears:     Comments: Excess cerumen without apparent complete obstruction, bilateral canals. Eyes:     Conjunctiva/sclera: Conjunctivae normal.     Pupils: Pupils are equal, round, and reactive to light.  Neck:     Thyroid: No thyromegaly.  Cardiovascular:     Rate and Rhythm: Normal rate and regular rhythm.     Heart sounds: Normal heart sounds. No murmur heard. Pulmonary:     Effort: Pulmonary effort is normal. No respiratory distress.     Breath sounds: Normal breath sounds. No wheezing.  Chest:     Chest wall: No swelling.  Breasts:    Right: Skin change (Well-healed scar mid right breast.  No erythema, no other skin changes noted) present. No swelling or mass.     Left: Normal. No swelling, mass, nipple discharge, skin change or tenderness.  Abdominal:     General: Bowel sounds are normal.     Palpations: Abdomen is soft.     Tenderness: There is no abdominal tenderness.  Musculoskeletal:        General: No tenderness.  Normal range of motion.     Cervical back: Normal range of motion and neck supple.  Lymphadenopathy:     Cervical: No cervical adenopathy.     Upper Body:     Right upper body: No supraclavicular or axillary adenopathy.     Left upper body: No supraclavicular or  axillary adenopathy.  Skin:    General: Skin is warm and dry.     Findings: No rash.  Neurological:     Mental Status: She is alert and oriented to person, place, and time.  Psychiatric:        Behavior: Behavior normal.        Thought Content: Thought content normal.         Assessment & Plan:  Molly Lambert is a 87 y.o. female . -Annual physical exam - Plan: MM Digital Screening, Comprehensive metabolic panel, Lipid panel, Hemoglobin A1c, TSH  - -anticipatory guidance as below in AVS, screening labs above. Health maintenance items as above in HPI discussed/recommended as applicable.   Hypothyroidism, unspecified type - Plan: levothyroxine (SYNTHROID) 50 MCG tablet, TSH  -  Stable, tolerating current regimen. Medications refilled. Labs pending as above.   Essential hypertension - Plan: Comprehensive metabolic panel, chlorthalidone (HYGROTON) 25 MG tablet, amLODipine (NORVASC) 10 MG tablet, lisinopril (ZESTRIL) 5 MG tablet  -  Stable, tolerating current regimen. Medications refilled. Labs pending as above.   Hypercholesteremia - Plan: Lipid panel, simvastatin (ZOCOR) 20 MG tablet  -  Stable, tolerating current regimen. Medications refilled. Labs pending as above.   Encounter for screening mammogram for malignant neoplasm of breast - Plan: MM Digital Screening History of breast cancer  -No apparent acute findings on clinical breast exam as above.  Check mammogram.  Screening for diabetes mellitus - Plan: Hemoglobin A1c  -With hyperglycemia on August 2022 labs.  Check A1c.  Hearing difficulty of both ears - Plan: Ambulatory referral to Audiology Excessive cerumen in both ear canals  -Suspect component of sensorineural hearing loss, refer to audiology but also discussed cerumen excess and over-the-counter Debrox initially, option of lavage if ineffective.  Bilateral Grabert pain Osteoarthritis of both hands, unspecified osteoarthritis type  -Multiple other areas of  osteoarthritis, suspect same for hands with prominent DIP joints, some rotation.  She is able to grasp, will initially try over-the-counter Voltaren as an option for discomfort.  Glucosamine/chondroitin as an option.  Consider [place] specialist eval if persistent difficulties.  Osteopenia, unspecified location - Plan: DG Bone Density  -Check updated bone density.  No orders of the defined types were placed in this encounter.  There are no Patient Instructions on file for this visit.    Signed,   [name], MD [facility] 10/03/21 10:49 AM

## 2021-10-11 ENCOUNTER — Other Ambulatory Visit: Payer: Self-pay

## 2021-10-11 DIAGNOSIS — R739 Hyperglycemia, unspecified: Secondary | ICD-10-CM

## 2021-10-11 DIAGNOSIS — I10 Essential (primary) hypertension: Secondary | ICD-10-CM

## 2021-10-11 DIAGNOSIS — E039 Hypothyroidism, unspecified: Secondary | ICD-10-CM

## 2021-11-14 ENCOUNTER — Ambulatory Visit: Payer: Medicare Other | Admitting: Family Medicine

## 2022-01-11 ENCOUNTER — Other Ambulatory Visit: Payer: Medicare Other

## 2022-01-21 ENCOUNTER — Other Ambulatory Visit (INDEPENDENT_AMBULATORY_CARE_PROVIDER_SITE_OTHER): Payer: Medicare Other

## 2022-01-21 ENCOUNTER — Telehealth: Payer: Self-pay

## 2022-01-21 DIAGNOSIS — R739 Hyperglycemia, unspecified: Secondary | ICD-10-CM | POA: Diagnosis not present

## 2022-01-21 DIAGNOSIS — E039 Hypothyroidism, unspecified: Secondary | ICD-10-CM | POA: Diagnosis not present

## 2022-01-21 DIAGNOSIS — I10 Essential (primary) hypertension: Secondary | ICD-10-CM | POA: Diagnosis not present

## 2022-01-21 LAB — COMPREHENSIVE METABOLIC PANEL
ALT: 14 U/L (ref 0–35)
AST: 18 U/L (ref 0–37)
Albumin: 4.2 g/dL (ref 3.5–5.2)
Alkaline Phosphatase: 81 U/L (ref 39–117)
BUN: 26 mg/dL — ABNORMAL HIGH (ref 6–23)
CO2: 27 mEq/L (ref 19–32)
Calcium: 9.8 mg/dL (ref 8.4–10.5)
Chloride: 97 mEq/L (ref 96–112)
Creatinine, Ser: 0.96 mg/dL (ref 0.40–1.20)
GFR: 53.82 mL/min — ABNORMAL LOW (ref >60.00)
Glucose, Bld: 111 mg/dL — ABNORMAL HIGH (ref 70–99)
Potassium: 3.5 mEq/L (ref 3.5–5.1)
Sodium: 133 mEq/L — ABNORMAL LOW (ref 135–145)
Total Bilirubin: 0.6 mg/dL (ref 0.2–1.2)
Total Protein: 7.2 g/dL (ref 6.0–8.3)

## 2022-01-21 LAB — LIPID PANEL
Cholesterol: 184 mg/dL (ref 0–200)
HDL: 61.7 mg/dL (ref >39.00)
LDL Cholesterol: 99 mg/dL (ref 0–99)
NonHDL: 122.65
Total CHOL/HDL Ratio: 3
Triglycerides: 119 mg/dL (ref 0.0–149.0)
VLDL: 23.8 mg/dL (ref 0.0–40.0)

## 2022-01-21 LAB — HEMOGLOBIN A1C: Hgb A1c MFr Bld: 6.4 % (ref 4.6–6.5)

## 2022-01-21 LAB — TSH: TSH: 0.85 u[IU]/mL (ref 0.35–5.50)

## 2022-01-21 NOTE — Telephone Encounter (Signed)
Received disability parking placard paperwork from the front and put it in Dr. Mancel Bale folder

## 2022-01-21 NOTE — Telephone Encounter (Signed)
Patient dropped off a request for disability parking placard, this has been placed in the file at front desk with a charge sheet attached

## 2022-01-21 NOTE — Telephone Encounter (Signed)
History of hip arthritis, disability placard completed. Paperwork completed and placed in CMA bin at back nurse station

## 2022-01-22 ENCOUNTER — Other Ambulatory Visit: Payer: Self-pay

## 2022-01-22 ENCOUNTER — Telehealth: Payer: Self-pay

## 2022-01-22 ENCOUNTER — Encounter: Payer: Self-pay | Admitting: Family Medicine

## 2022-01-22 NOTE — Telephone Encounter (Signed)
Lab letter sent to pt as directed by Dr Carlota Raspberry

## 2022-01-22 NOTE — Telephone Encounter (Signed)
-----   Message from Wendie Agreste, MD sent at 01/22/2022  8:07 AM EST ----- Lab letter  Sodium level borderline low but not concerning at that level.  Blood sugar few points elevated, overall electrolytes looked okay.  Thyroid test was normal.  88-monthblood sugar test still at prediabetes range, stable.  Cholesterol levels were stable.  Let me know if there are questions.  Dr. GCarlota Raspberry

## 2022-01-22 NOTE — Telephone Encounter (Signed)
Called to inform the patient this is ready, no answer patient had requested this be mailed to her so I will drop this in the mail today

## 2022-04-17 ENCOUNTER — Other Ambulatory Visit: Payer: Self-pay | Admitting: Family Medicine

## 2022-04-17 DIAGNOSIS — I10 Essential (primary) hypertension: Secondary | ICD-10-CM

## 2022-05-14 ENCOUNTER — Other Ambulatory Visit: Payer: Self-pay

## 2022-05-14 ENCOUNTER — Other Ambulatory Visit: Payer: Self-pay | Admitting: Family Medicine

## 2022-05-14 DIAGNOSIS — E7800 Pure hypercholesterolemia, unspecified: Secondary | ICD-10-CM

## 2022-05-14 MED ORDER — SIMVASTATIN 20 MG PO TABS: ORAL_TABLET | ORAL | 2 refills | Status: DC

## 2022-05-14 NOTE — Telephone Encounter (Signed)
Refilled -Simvastatin Zocor 20 mg.   Blakelynn Scheeler,CMA

## 2022-05-14 NOTE — Telephone Encounter (Signed)
Pt aware Rx has been sent in.  

## 2022-06-11 ENCOUNTER — Telehealth: Payer: Self-pay | Admitting: Family Medicine

## 2022-06-11 NOTE — Telephone Encounter (Signed)
Contacted Molly Lambert to schedule their annual wellness visit. Appointment made for 06/19/2022.  Thank you,  [facility] Support [facility] Direct dial  [phone]

## 2022-06-18 ENCOUNTER — Telehealth: Payer: Self-pay | Admitting: Family Medicine

## 2022-06-18 NOTE — Telephone Encounter (Signed)
I left message on vm for patient to remind her of [facility] phone call on 06/19/2022 at 10:30.

## 2022-06-19 ENCOUNTER — Ambulatory Visit (INDEPENDENT_AMBULATORY_CARE_PROVIDER_SITE_OTHER): Payer: Medicare Other | Admitting: *Deleted

## 2022-06-19 DIAGNOSIS — Z0000 Encounter for general adult medical examination without abnormal findings: Secondary | ICD-10-CM

## 2022-06-19 NOTE — Progress Notes (Signed)
Subjective:   Molly Lambert is a 88 y.o. female who presents for Medicare Annual (Subsequent) preventive examination.  I connected with  Molly Lambert on 06/19/22 by a telephone enabled telemedicine application and verified that I am speaking with the correct person using two identifiers.   I discussed the limitations of evaluation and management by telemedicine. The patient expressed understanding and agreed to proceed.  Patient location: home  Provider location: telephone/home    Review of Systems     Cardiac Risk Factors include: advanced age (>42men, >39 women);family history of premature cardiovascular disease;hypertension     Objective:    Today's Vitals   06/19/22 1036  PainSc: 5    There is no height or weight on file to calculate BMI.     06/19/2022   10:39 AM 07/20/2019   10:36 AM 11/20/2016    5:30 PM 11/11/2016   11:01 AM 07/17/2016    7:31 PM 07/17/2016   10:29 AM 07/10/2016   11:20 AM  Advanced Directives  Does Patient Have a Medical Advance Directive? Yes No Yes Yes Yes Yes Yes  Type of Advance Directive Living will  Healthcare Power of [place];Living will Healthcare Power of [place];Living will Living will Living will Living will  Does patient want to make changes to medical advance directive?   No - Patient declined No - Patient declined No - Patient declined  No - Patient declined  Copy of Healthcare Power of Attorney in Chart?   No - copy requested No - copy requested     Would patient like information on creating a medical advance directive? No - Patient declined No - Patient declined         Current Medications (verified) Outpatient Encounter Medications as of 06/19/2022  Medication Sig   amLODipine (NORVASC) 10 MG tablet TAKE 1 TABLET(10 MG) BY MOUTH DAILY   chlorthalidone (HYGROTON) 25 MG tablet TAKE 1 TABLET(25 MG) BY MOUTH DAILY   fluticasone (FLONASE) 50 MCG/ACT nasal spray Place 1 spray into both nostrils daily.   levothyroxine (SYNTHROID) 50  MCG tablet TAKE 1 TABLET(50 MCG) BY MOUTH DAILY BEFORE BREAKFAST   lisinopril (ZESTRIL) 5 MG tablet TAKE 1/2 TABLET(2.5 MG) BY MOUTH DAILY   simvastatin (ZOCOR) 20 MG tablet TAKE 1 TO 2 TABLETS(20 TO 40 MG) BY MOUTH DAILY AT 6 PM   simvastatin (ZOCOR) 20 MG tablet TAKE 1 TO 2 TABLETS(20 TO 40 MG) BY MOUTH DAILY AT 6 PM   No facility-administered encounter medications on file as of 06/19/2022.    Allergies (verified) Xarelto [rivaroxaban]   History: Past Medical History:  Diagnosis Date   Arthritis    Breast cancer 11/2010   Rt breast   Cataract    Erythema    reddened area of epidermis spnning concentrated around sinuses on the face; pt reports cracking, itchiness, and flaking  of affected skin; seen derm no absolute dx, will get 2nd opinion    Hypercholesteremia    Hypertension    NO CARDIAC MD,  [facility]  PCP  ,  URGENT MED   Hypothyroidism    Leg laceration    WITH SURGERY   Paget's carcinoma of the nipple, right    nipple removed   Personal history of radiation therapy    S/P radiation therapy 03/14/11 - 04/05/11   Right Breast / 4256 cGy in 16 Fractions   Thyroid disease    Past Surgical History:  Procedure Laterality Date   BREAST BIOPSY     BREAST  LUMPECTOMY  01/22/11   RIGHT BREAST LUMPECTOMY WITH BIOSPY OF 1 SENTINEL NODE, INVASIVE GRADE II DUCTAL CARCINOMA , INTERMEDIATE GRADE DUCTAL CARCINOMA IN SITU , DERMAL SKIN INVOLVED, MARGINS NEGATIVE,( 0/1)  NODE POSITIVE, ER+, PR+, LOW s-PHASE, HER 2 NEU- NO AMPLIFICATION   BREAST SURGERY  11/28/10   SKIN-PUNCH BIOSPY RIGHT BREAST(NIPPLE), ER+, RP+, LOW S-PHASE, HER 2NEU NEGAITIVE   EYE SURGERY      BILATERAL CATARACT SURGERY   JOINT REPLACEMENT     left hip   PARTIAL MASTECTOMY WITH NEEDLE LOCALIZATION  01/10/2012   Procedure: PARTIAL MASTECTOMY WITH NEEDLE LOCALIZATION;  Surgeon: Maisie Fus A. Cornett, MD;  Location: [facility];  Service: General;  Laterality: Right;  right breast needle localized partial  mastectomy and removal of nipple    TOTAL HIP ARTHROPLASTY Left 07/17/2016   Procedure: LEFT TOTAL HIP ARTHROPLASTY ANTERIOR APPROACH;  Surgeon: Ollen Gross, MD;  Location: [facility];  Service: Orthopedics;  Laterality: Left;   TOTAL HIP ARTHROPLASTY Right 11/20/2016   Procedure: RIGHT TOTAL HIP ARTHROPLASTY ANTERIOR APPROACH;  Surgeon: Ollen Gross, MD;  Location: [facility];  Service: Orthopedics;  Laterality: Right;   TUBAL LIGATION  1972   Family History  Problem Relation Age of Onset   Kidney disease Mother        kidney failure    Kidney failure Mother    Stroke Mother    Heart disease Father        heart attack    Heart attack Father    Lymphoma Sister    Pancreatitis Sister    Social History   Socioeconomic History   Marital status: Widowed    Spouse name: Not on file   Number of children: 3   Years of education: 70   Highest education level: Not on file  Occupational History   Occupation: Waitress    Comment: [employer]  Tobacco Use   Smoking status: Never   Smokeless tobacco: Never  Substance and Sexual Activity   Alcohol use: No    Alcohol/week: 0.0 standard drinks of alcohol    Comment: Rarely/On Special Occasions   Drug use: No   Sexual activity: Not Currently    Comment: G3, P3, MENARCHE, AGE 35, MENOPAUSE AGE 20, NO BC AND HRT X 10 YEARS  Other Topics Concern   Not on file  Social History Narrative   Married for 56 years      3 children, one deceased   6 grand-children, 3 great-grandchildren   Social Determinants of Health   Financial Resource Strain: Low Risk  (06/19/2022)   Overall Financial Resource Strain (CARDIA)    Difficulty of Paying Living Expenses: Not hard at all  Food Insecurity: No Food Insecurity (06/19/2022)   Hunger Vital Sign    Worried About Running Out of Food in the Last Year: Never true    Ran Out of Food in the Last Year: Never true  Transportation Needs: No Transportation Needs (06/19/2022)   PRAPARE -  Administrator, Civil Service (Medical): No    Lack of Transportation (Non-Medical): No  Physical Activity: Inactive (06/19/2022)   Exercise Vital Sign    Days of Exercise per Week: 0 days    Minutes of Exercise per Session: 0 min  Stress: No Stress Concern Present (06/19/2022)   [employer] of Occupational Health - Occupational Stress Questionnaire    Feeling of Stress : Not at all  Social Connections: Socially Isolated (06/19/2022)   Social Connection and  Isolation Panel [NHANES]    Frequency of Communication with Friends and Family: More than three times a week    Frequency of Social Gatherings with Friends and Family: Twice a week    Attends Religious Services: Never    Database administrator or Organizations: No    Attends Banker Meetings: Never    Marital Status: Widowed    Tobacco Counseling Counseling given: Not Answered   Clinical Intake:  Pre-visit preparation completed: Yes  Pain : 0-10 Pain Score: 5  Pain Location: Shoulder Pain Orientation: Right Pain Descriptors / Indicators: Aching, Dull, Burning Pain Onset: More than a month ago Pain Frequency: Intermittent     Nutritional Risks: None Diabetes: No  How often do you need to have someone help you when you read instructions, pamphlets, or other written materials from your doctor or pharmacy?: 1 - Never  Diabetic?  no  Interpreter Needed?: No  Information entered by :: Remi Haggard LPN   Activities of Daily Living    06/19/2022   10:43 AM  In your present state of health, do you have any difficulty performing the following activities:  Hearing? 1  Vision? 0  Difficulty concentrating or making decisions? 0  Walking or climbing stairs? 0  Dressing or bathing? 0  Preparing Food and eating ? N  Using the Toilet? N  In the past six months, have you accidently leaked urine? N  Do you have problems with loss of bowel control? N  Managing your Medications? N  Managing your  Finances? N  Housekeeping or managing your Housekeeping? N    Patient Care Team: Shade Flood, MD as PCP - General (Family Medicine) Magrinat, Valentino Hue, MD (Inactive) as Consulting Physician (Oncology)  Indicate any recent Medical Services you may have received from other than [facility] providers in the past year (date may be approximate).     Assessment:   This is a routine wellness examination for Molly Lambert.  Hearing/Vision screen Hearing Screening - Comments:: Some trouble No hearing aids Vision Screening - Comments:: Not up to date  Dietary issues and exercise activities discussed: Current Exercise Habits: Home exercise routine, Type of exercise: walking;Other - see comments (class on tv), Time (Minutes): 30, Frequency (Times/Week): 3, Weekly Exercise (Minutes/Week): 90, Intensity: Mild   Goals Addressed             This Visit's Progress    Patient Stated       Continue current lifestyle       Depression Screen    06/19/2022   10:50 AM 10/03/2021   10:15 AM 04/05/2021    1:01 PM 10/02/2020    2:25 PM 09/22/2019   11:02 AM 07/30/2019   10:07 AM 07/20/2019   10:41 AM  PHQ 2/9 Scores  PHQ - 2 Score 0 0 0 0 0 0 0  PHQ- 9 Score 2  0        Fall Risk    06/19/2022   10:39 AM 10/03/2021   10:15 AM 04/05/2021    1:01 PM 10/02/2020    2:25 PM 09/22/2019   11:02 AM  Fall Risk   Falls in the past year? 0 0 0 0 0  Number falls in past yr: 0 0 0 0 0  Injury with Fall? 0 0 0 0 0  Risk for fall due to :  No Fall Risks No Fall Risks No Fall Risks No Fall Risks  Follow up Falls evaluation completed;Education provided;Falls prevention discussed Falls  evaluation completed Falls evaluation completed Falls evaluation completed Falls evaluation completed    FALL RISK PREVENTION PERTAINING TO THE HOME:  Any stairs in or around the home? Yes  If so, are there any without handrails? No  Home free of loose throw rugs in walkways, pet beds, electrical cords, etc? Yes  Adequate lighting in  your home to reduce risk of falls? Yes   ASSISTIVE DEVICES UTILIZED TO PREVENT FALLS:  Life alert? No  Use of a cane, walker or w/c? Yes  Grab bars in the bathroom? Yes  Shower chair or bench in shower? Yes  Elevated toilet seat or a handicapped toilet? No   TIMED UP AND GO:  Was the test performed? No .    Cognitive Function:        06/19/2022   10:46 AM 07/20/2019   10:39 AM  6CIT Screen  What Year? 0 points 0 points  What month? 0 points 0 points  What time? 0 points 0 points  Count back from 20 0 points 0 points  Months in reverse 0 points 0 points  Repeat phrase 0 points 0 points  Total Score 0 points 0 points    Immunizations Immunization History  Administered Date(s) Administered   PFIZER(Purple Top)SARS-COV-2 Vaccination 07/01/2019, 07/22/2019, 03/06/2020   Pneumococcal Conjugate-13 02/29/2016   Pneumococcal Polysaccharide-23 08/23/2010   Tdap 07/20/2009    TDAP status: Due, Education has been provided regarding the importance of this vaccine. Advised may receive this vaccine at local pharmacy or Health Dept. Aware to provide a copy of the vaccination record if obtained from local pharmacy or Health Dept. Verbalized acceptance and understanding.  Flu Vaccine status: Up to date  Pneumococcal vaccine status: Up to date  Covid-19 vaccine status: Completed vaccines  Qualifies for Shingles Vaccine? Yes   Zostavax completed No   Shingrix Completed?: No.    Education has been provided regarding the importance of this vaccine. Patient has been advised to call insurance company to determine out of pocket expense if they have not yet received this vaccine. Advised may also receive vaccine at local pharmacy or Health Dept. Verbalized acceptance and understanding.  Screening Tests Health Maintenance  Topic Date Due   MAMMOGRAM  04/18/2019   COVID-19 Vaccine (4 - 2023-24 season) 07/05/2022 (Originally 10/26/2021)   Zoster Vaccines- Shingrix (1 of 2) 09/18/2022  (Originally 03/14/1953)   INFLUENZA VACCINE  09/26/2022   Medicare Annual Wellness (AWV)  06/19/2023   Pneumonia Vaccine 55+ Years old  Completed   DEXA SCAN  Completed   HPV VACCINES  Aged Out   DTaP/Tdap/Td  Discontinued    Health Maintenance  Health Maintenance Due  Topic Date Due   MAMMOGRAM  04/18/2019    Colorectal cancer screening: No longer required.   Mammogram status: Ordered  . Pt provided with contact info and advised to call to schedule appt.   Bone Density paitent declined   Education provided  Lung Cancer Screening: (Low Dose CT Chest recommended if Age 61-80 years, 30 pack-year currently smoking OR have quit w/in 15years.) does not qualify.   Lung Cancer Screening Referral:   Additional Screening:  Hepatitis C Screening: does not qualify  Vision Screening: Recommended annual ophthalmology exams for early detection of glaucoma and other disorders of the eye. Is the patient up to date with their annual eye exam?  No  Who is the provider or what is the name of the office in which the patient attends annual eye exams? Will call to schedule  If pt is not established with a provider, would they like to be referred to a provider to establish care? No .   Dental Screening: Recommended annual dental exams for proper oral hygiene  Community Resource Referral / Chronic Care Management: CRR required this visit?  No   CCM required this visit?  No      Plan:     I have personally reviewed and noted the following in the patient's chart:   Medical and social history Use of alcohol, tobacco or illicit drugs  Current medications and supplements including opioid prescriptions. Patient is not currently taking opioid prescriptions. Functional ability and status Nutritional status Physical activity Advanced directives List of other physicians Hospitalizations, surgeries, and ER visits in previous 12 months Vitals Screenings to include cognitive, depression, and  falls Referrals and appointments  In addition, I have reviewed and discussed with patient certain preventive protocols, quality metrics, and best practice recommendations. A written personalized care plan for preventive services as well as general preventive health recommendations were provided to patient.     Remi Haggard, LPN   6/96/2952   Nurse Notes:

## 2022-06-20 ENCOUNTER — Other Ambulatory Visit: Payer: Self-pay | Admitting: Family Medicine

## 2022-06-20 DIAGNOSIS — E039 Hypothyroidism, unspecified: Secondary | ICD-10-CM

## 2022-06-26 ENCOUNTER — Ambulatory Visit: Payer: Medicare Other | Admitting: Family Medicine

## 2022-08-18 ENCOUNTER — Other Ambulatory Visit: Payer: Self-pay | Admitting: Family Medicine

## 2022-08-18 DIAGNOSIS — I10 Essential (primary) hypertension: Secondary | ICD-10-CM

## 2022-09-26 ENCOUNTER — Encounter: Payer: Self-pay | Admitting: Family Medicine

## 2022-09-26 ENCOUNTER — Ambulatory Visit (INDEPENDENT_AMBULATORY_CARE_PROVIDER_SITE_OTHER): Payer: Medicare Other | Admitting: Family Medicine

## 2022-09-26 VITALS — BP 128/64 | HR 63 | Temp 97.9°F | Ht 64.0 in | Wt 153.6 lb

## 2022-09-26 DIAGNOSIS — E039 Hypothyroidism, unspecified: Secondary | ICD-10-CM | POA: Diagnosis not present

## 2022-09-26 DIAGNOSIS — I10 Essential (primary) hypertension: Secondary | ICD-10-CM

## 2022-09-26 DIAGNOSIS — H9193 Unspecified hearing loss, bilateral: Secondary | ICD-10-CM

## 2022-09-26 DIAGNOSIS — Z853 Personal history of malignant neoplasm of breast: Secondary | ICD-10-CM

## 2022-09-26 DIAGNOSIS — H6123 Impacted cerumen, bilateral: Secondary | ICD-10-CM

## 2022-09-26 DIAGNOSIS — M8580 Other specified disorders of bone density and structure, unspecified site: Secondary | ICD-10-CM | POA: Diagnosis not present

## 2022-09-26 DIAGNOSIS — Z0000 Encounter for general adult medical examination without abnormal findings: Secondary | ICD-10-CM | POA: Diagnosis not present

## 2022-09-26 DIAGNOSIS — R7303 Prediabetes: Secondary | ICD-10-CM

## 2022-09-26 DIAGNOSIS — E7800 Pure hypercholesterolemia, unspecified: Secondary | ICD-10-CM

## 2022-09-26 DIAGNOSIS — M791 Myalgia, unspecified site: Secondary | ICD-10-CM

## 2022-09-26 LAB — LIPID PANEL
Cholesterol: 260 mg/dL — ABNORMAL HIGH (ref 0–200)
HDL: 60 mg/dL (ref >39.00)
LDL Cholesterol: 166 mg/dL — ABNORMAL HIGH (ref 0–99)
NonHDL: 199.59
Total CHOL/HDL Ratio: 4
Triglycerides: 168 mg/dL — ABNORMAL HIGH (ref 0.0–149.0)
VLDL: 33.6 mg/dL (ref 0.0–40.0)

## 2022-09-26 LAB — COMPREHENSIVE METABOLIC PANEL
ALT: 12 U/L (ref 0–35)
AST: 18 U/L (ref 0–37)
Albumin: 4.1 g/dL (ref 3.5–5.2)
Alkaline Phosphatase: 82 U/L (ref 39–117)
BUN: 26 mg/dL — ABNORMAL HIGH (ref 6–23)
CO2: 28 mEq/L (ref 19–32)
Calcium: 9.8 mg/dL (ref 8.4–10.5)
Chloride: 99 mEq/L (ref 96–112)
Creatinine, Ser: 0.92 mg/dL (ref 0.40–1.20)
GFR: 56.37 mL/min — ABNORMAL LOW (ref >60.00)
Glucose, Bld: 93 mg/dL (ref 70–99)
Potassium: 3.7 mEq/L (ref 3.5–5.1)
Sodium: 136 mEq/L (ref 135–145)
Total Bilirubin: 0.7 mg/dL (ref 0.2–1.2)
Total Protein: 7.2 g/dL (ref 6.0–8.3)

## 2022-09-26 LAB — TSH: TSH: 2.71 u[IU]/mL (ref 0.35–5.50)

## 2022-09-26 LAB — HEMOGLOBIN A1C: Hgb A1c MFr Bld: 6.1 % (ref 4.6–6.5)

## 2022-09-26 NOTE — Patient Instructions (Addendum)
I will reorder the mammogram and bone density test.  I recommend supplement for calcium and vitamin D with history of thin bones. Oscal+D is an option. 1200-1500mg  calcium, and [phone] units vitamin D.  Blood pressure looks ok today. If runs high at home, schedule a visit with your blood pressure meter to see if it is accurate.   You can try stopping simvastatin cholesterol medicine for 2 weeks to see if that helps any of those aches and pains.  If no relief then it is unlikely a problem with the cholesterol medicine and more likely arthritis  restart med if no change.  If it does help coming off of that medication, let me know and we can decide on intermittent dosing or a different medication.   Please see your eye specialist and I recommend dentist visit.   I will refer you to ear nose and throat for the earwax, but also audiology as I think there may be other types of hearing loss.  Please keep appointment with them as we discussed today.  Thanks for coming in today and take care.   Preventive Care 50 Years and Older, Female Preventive care refers to lifestyle choices and visits with your health care provider that can promote health and wellness. Preventive care visits are also called wellness exams. What can I expect for my preventive care visit? Counseling Your health care provider may ask you questions about your: Medical history, including: Past medical problems. Family medical history. Pregnancy and menstrual history. History of falls. Current health, including: Memory and ability to understand (cognition). Emotional well-being. Home life and relationship well-being. Sexual activity and sexual health. Lifestyle, including: Alcohol, nicotine or tobacco, and drug use. Access to firearms. Diet, exercise, and sleep habits. Work and work Astronomer. Sunscreen use. Safety issues such as seatbelt and bike helmet use. Physical exam Your health care provider will check  your: Height and weight. These may be used to calculate your BMI (body mass index). BMI is a measurement that tells if you are at a healthy weight. Waist circumference. This measures the distance around your waistline. This measurement also tells if you are at a healthy weight and may help predict your risk of certain diseases, such as type 2 diabetes and high blood pressure. Heart rate and blood pressure. Body temperature. Skin for abnormal spots. What immunizations do I need?  Vaccines are usually given at various ages, according to a schedule. Your health care provider will recommend vaccines for you based on your age, medical history, and lifestyle or other factors, such as travel or where you work. What tests do I need? Screening Your health care provider may recommend screening tests for certain conditions. This may include: Lipid and cholesterol levels. Hepatitis C test. Hepatitis B test. HIV (human immunodeficiency virus) test. STI (sexually transmitted infection) testing, if you are at risk. Lung cancer screening. Colorectal cancer screening. Diabetes screening. This is done by checking your blood sugar (glucose) after you have not eaten for a while (fasting). Mammogram. Talk with your health care provider about how often you should have regular mammograms. BRCA-related cancer screening. This may be done if you have a family history of breast, ovarian, tubal, or peritoneal cancers. Bone density scan. This is done to screen for osteoporosis. Talk with your health care provider about your test results, treatment options, and if necessary, the need for more tests. Follow these instructions at home: Eating and drinking  Eat a diet that includes fresh fruits and vegetables, whole grains,  lean protein, and low-fat dairy products. Limit your intake of foods with high amounts of sugar, saturated fats, and salt. Take vitamin and mineral supplements as recommended by your health care  provider. Do not drink alcohol if your health care provider tells you not to drink. If you drink alcohol: Limit how much you have to 0-1 drink a day. Know how much alcohol is in your drink. In the [place] one drink equals one 12 oz bottle of beer (355 mL), one 5 oz glass of wine (148 mL), or one 1 oz glass of hard liquor (44 mL). Lifestyle Brush your teeth every morning and night with fluoride toothpaste. Floss one time each day. Exercise for at least 30 minutes 5 or more days each week. Do not use any products that contain nicotine or tobacco. These products include cigarettes, chewing tobacco, and vaping devices, such as e-cigarettes. If you need help quitting, ask your health care provider. Do not use drugs. If you are sexually active, practice safe sex. Use a condom or other form of protection in order to prevent STIs. Take aspirin only as told by your health care provider. Make sure that you understand how much to take and what form to take. Work with your health care provider to find out whether it is safe and beneficial for you to take aspirin daily. Ask your health care provider if you need to take a cholesterol-lowering medicine (statin). Find healthy ways to manage stress, such as: Meditation, yoga, or listening to music. Journaling. Talking to a trusted person. Spending time with friends and family. Minimize exposure to UV radiation to reduce your risk of skin cancer. Safety Always wear your seat belt while driving or riding in a vehicle. Do not drive: If you have been drinking alcohol. Do not ride with someone who has been drinking. When you are tired or distracted. While texting. If you have been using any mind-altering substances or drugs. Wear a helmet and other protective equipment during sports activities. If you have firearms in your house, make sure you follow all gun safety procedures. What's next? Visit your health care provider once a year for an annual wellness  visit. Ask your health care provider how often you should have your eyes and teeth checked. Stay up to date on all vaccines. This information is not intended to replace advice given to you by your health care provider. Make sure you discuss any questions you have with your health care provider. Document Revised: 08/09/2020 Document Reviewed: 08/09/2020 Elsevier Patient Education  2024 [employer].

## 2022-09-26 NOTE — Progress Notes (Signed)
Subjective:  Patient ID: Molly Lambert, female    DOB: October 15, 1934  Age: 88 y.o. MRN: [id number]  CC:  Chief Complaint  Patient presents with   Medical Management of Chronic Issues    Pt is doing well, notes the statin is causing some muscle aches notes very bothersome at night     HPI Molly Lambert presents for Annual Exam Annual wellness exam performed in April.  Here for physical, med follow-up today.  Care team PCP, [facility], prior Dr. Darnelle Catalan,  - no recent oncology visit. history of breast cancer, lumpectomy in 2013, treated with anastrozole, prior surgery radiation therapy, and antiestrogen therapy.  Yearly mammography and clinical breast exam.  Overdue for mammogram at her last physical in August 2023, in addition to bone density as osteopenia in 2016.  Both were ordered, have not been completed. Orthopedics, Dr. Lequita Halt,  Prior right total hip surgery, 2018.  Hypertension: Amlodipine 10 mg daily, lisinopril 2.5mg  daily, chlorthalidone 25 mg daily, no new side effects.  Home readings: up to 140/72.  BP Readings from Last 3 Encounters:  09/26/22 128/64  10/03/21 138/76  04/05/21 108/60   Lab Results  Component Value Date   CREATININE 0.96 01/21/2022   Hypothyroidism: Lab Results  Component Value Date   TSH 0.85 01/21/2022  Synthroid 50 mcg daily Taking medication daily.  No new hot or cold intolerance. No new hair or skin changes, heart palpitations or new fatigue. No new weight changes.   Hyperlipidemia: Simvastatin 20 mg daily.  Reports some myalgias, particularly at night. Shoulder - has arthritis - better if active. Elbows, hands.  Lab Results  Component Value Date   CHOL 184 01/21/2022   HDL 61.70 01/21/2022   LDLCALC 99 01/21/2022   LDLDIRECT 113.0 10/02/2020   TRIG 119.0 01/21/2022   CHOLHDL 3 01/21/2022   Lab Results  Component Value Date   ALT 14 01/21/2022   AST 18 01/21/2022   ALKPHOS 81 01/21/2022   BILITOT 0.6 01/21/2022    Hearing  difficulty: Few years. Cerumen noted last year, recommended Debrox, did not use. Referred to audiology last year for possible sensorineural hearing loss. Did not see audiologist. Agrees to see provider this time. Requests ENT to eval for cerumen removal.    Prediabetes: Borderline diabetic level with A1c that increased from 6.1-6.5 in August 2023, improved to 6.4 in November. Lab Results  Component Value Date   HGBA1C 6.4 01/21/2022   Wt Readings from Last 3 Encounters:  09/26/22 153 lb 9.6 oz (69.7 kg)  10/03/21 160 lb 12.8 oz (72.9 kg)  04/05/21 164 lb 3.2 oz (74.5 kg)         09/26/2022   10:48 AM 06/19/2022   10:50 AM 10/03/2021   10:15 AM 04/05/2021    1:01 PM 10/02/2020    2:25 PM  Depression screen PHQ 2/9  Decreased Interest 0 0 0 0 0  Down, Depressed, Hopeless 0 0 0 0 0  PHQ - 2 Score 0 0 0 0 0  Altered sleeping 0 2  0   Tired, decreased energy 0 0  0   Change in appetite 0 0  0   Feeling bad or failure about yourself  0 0  0   Trouble concentrating 0 0  0   Moving slowly or fidgety/restless 0 0  0   Suicidal thoughts 0 0  0   PHQ-9 Score 0 2  0   Difficult doing work/chores  Not difficult at all  Not difficult at all     Health Maintenance  Topic Date Due   Zoster Vaccines- Shingrix (1 of 2) Never done   MAMMOGRAM  04/18/2019   COVID-19 Vaccine (4 - 2023-24 season) 10/26/2021   INFLUENZA VACCINE  09/26/2022   Medicare Annual Wellness (AWV)  06/19/2023   Pneumonia Vaccine 54+ Years old  Completed   DEXA SCAN  Completed   HPV VACCINES  Aged Out   DTaP/Tdap/Td  Discontinued  History of breast CA as above. 2013. Overdue for mammogram and BMD. Occasional BSE - no new lumps or areas or concern. Declines clinical breast exam today. Agrees to mammogram. Prior transportation issues, not currently. Has car back now.  Osteopenia: 2016, overdue for BMD. No current calcium/vit D supplement.    Immunization History  Administered Date(s) Administered   PFIZER(Purple  Top)SARS-COV-2 Vaccination 07/01/2019, 07/22/2019, 03/06/2020   Pneumococcal Conjugate-13 02/29/2016   Pneumococcal Polysaccharide-23 08/23/2010   Tdap 07/20/2009    No results found. No recent visit  - plans to schedule  Dental: natural teeth-  declines meeting with dentist.   Alcohol: none  Tobacco: none  Exercise:mowing with push mower monthly. Walking some during the week.    History Patient Active Problem List   Diagnosis Date Noted   OA (osteoarthritis) of hip 07/17/2016   Breast microcalcifications 12/27/2011   History of breast cancer 12/27/2011   Hypothyroidism 08/25/2011   Hypercholesteremia 08/25/2011   HTN (hypertension) 08/25/2011   Malignant neoplasm of central portion of right breast in female, estrogen receptor positive (HCC) 02/27/2011   Breast mass, right 11/28/2010   Past Medical History:  Diagnosis Date   Arthritis    Breast cancer (HCC) 11/2010   Rt breast   Cataract    Erythema    reddened area of epidermis spnning concentrated around sinuses on the face; pt reports cracking, itchiness, and flaking  of affected skin; seen derm no absolute dx, will get 2nd opinion    Hypercholesteremia    Hypertension    NO CARDIAC MD,  [facility]  PCP  ,  URGENT MED   Hypothyroidism    Leg laceration    WITH SURGERY   Paget's carcinoma of the nipple, right (HCC)    nipple removed   Personal history of radiation therapy    S/P radiation therapy 03/14/11 - 04/05/11   Right Breast / 4256 cGy in 16 Fractions   Thyroid disease    Past Surgical History:  Procedure Laterality Date   BREAST BIOPSY     BREAST LUMPECTOMY  01/22/11   RIGHT BREAST LUMPECTOMY WITH BIOSPY OF 1 SENTINEL NODE, INVASIVE GRADE II DUCTAL CARCINOMA , INTERMEDIATE GRADE DUCTAL CARCINOMA IN SITU , DERMAL SKIN INVOLVED, MARGINS NEGATIVE,( 0/1)  NODE POSITIVE, ER+, PR+, LOW s-PHASE, HER 2 NEU- NO AMPLIFICATION   BREAST SURGERY  11/28/10   SKIN-PUNCH BIOSPY RIGHT BREAST(NIPPLE), ER+, RP+, LOW  S-PHASE, HER 2NEU NEGAITIVE   EYE SURGERY      BILATERAL CATARACT SURGERY   JOINT REPLACEMENT     left hip   PARTIAL MASTECTOMY WITH NEEDLE LOCALIZATION  01/10/2012   Procedure: PARTIAL MASTECTOMY WITH NEEDLE LOCALIZATION;  Surgeon: Maisie Fus A. Cornett, MD;  Location: [facility];  Service: General;  Laterality: Right;  right breast needle localized partial mastectomy and removal of nipple    TOTAL HIP ARTHROPLASTY Left 07/17/2016   Procedure: LEFT TOTAL HIP ARTHROPLASTY ANTERIOR APPROACH;  Surgeon: Ollen Gross, MD;  Location: [facility];  Service: Orthopedics;  Laterality: Left;   TOTAL  HIP ARTHROPLASTY Right 11/20/2016   Procedure: RIGHT TOTAL HIP ARTHROPLASTY ANTERIOR APPROACH;  Surgeon: Ollen Gross, MD;  Location: [facility];  Service: Orthopedics;  Laterality: Right;   TUBAL LIGATION  1972   Allergies  Allergen Reactions   Xarelto [Rivaroxaban]    Prior to Admission medications   Medication Sig Start Date End Date Taking? Authorizing Provider  amLODipine (NORVASC) 10 MG tablet TAKE 1 TABLET(10 MG) BY MOUTH DAILY 10/03/21  Yes Shade Flood, MD  chlorthalidone (HYGROTON) 25 MG tablet TAKE 1 TABLET(25 MG) BY MOUTH DAILY 08/19/22  Yes Shade Flood, MD  fluticasone (FLONASE) 50 MCG/ACT nasal spray Place 1 spray into both nostrils daily. 10/02/20  Yes Janeece Agee, NP  levothyroxine (SYNTHROID) 50 MCG tablet TAKE 1 TABLET(50 MCG) BY MOUTH DAILY BEFORE BREAKFAST 06/20/22  Yes Shade Flood, MD  lisinopril (ZESTRIL) 5 MG tablet TAKE 1/2 TABLET(2.5 MG) BY MOUTH DAILY 10/03/21  Yes Shade Flood, MD  simvastatin (ZOCOR) 20 MG tablet TAKE 1 TO 2 TABLETS(20 TO 40 MG) BY MOUTH DAILY AT 6 PM Patient not taking: Reported on 09/26/2022 05/14/22   Shade Flood, MD  simvastatin (ZOCOR) 20 MG tablet TAKE 1 TO 2 TABLETS(20 TO 40 MG) BY MOUTH DAILY AT 6 PM Patient not taking: Reported on 09/26/2022 05/14/22   Shade Flood, MD   Social History   Socioeconomic History   Marital  status: Widowed    Spouse name: Not on file   Number of children: 3   Years of education: 53   Highest education level: Not on file  Occupational History   Occupation: Waitress    Comment: [employer]  Tobacco Use   Smoking status: Never   Smokeless tobacco: Never  Substance and Sexual Activity   Alcohol use: No    Alcohol/week: 0.0 standard drinks of alcohol    Comment: Rarely/On Special Occasions   Drug use: No   Sexual activity: Not Currently    Comment: G3, P3, MENARCHE, AGE 12, MENOPAUSE AGE 46, NO BC AND HRT X 10 YEARS  Other Topics Concern   Not on file  Social History Narrative   Married for 56 years      3 children, one deceased   6 grand-children, 3 great-grandchildren   Social Determinants of Health   Financial Resource Strain: Low Risk  (06/19/2022)   Overall Financial Resource Strain (CARDIA)    Difficulty of Paying Living Expenses: Not hard at all  Food Insecurity: No Food Insecurity (06/19/2022)   Hunger Vital Sign    Worried About Running Out of Food in the Last Year: Never true    Ran Out of Food in the Last Year: Never true  Transportation Needs: No Transportation Needs (06/19/2022)   PRAPARE - Administrator, Civil Service (Medical): No    Lack of Transportation (Non-Medical): No  Physical Activity: Inactive (06/19/2022)   Exercise Vital Sign    Days of Exercise per Week: 0 days    Minutes of Exercise per Session: 0 min  Stress: No Stress Concern Present (06/19/2022)   [employer] of Occupational Health - Occupational Stress Questionnaire    Feeling of Stress : Not at all  Social Connections: Socially Isolated (06/19/2022)   Social Connection and Isolation Panel [NHANES]    Frequency of Communication with Friends and Family: More than three times a week    Frequency of Social Gatherings with Friends and Family: Twice a week    Attends Religious Services:  Never    Active Member of Clubs or Organizations: No    Attends Occupational hygienist Meetings: Never    Marital Status: Widowed  Intimate Partner Violence: Not At Risk (06/19/2022)   Humiliation, Afraid, Rape, and Kick questionnaire    Fear of Current or Ex-Partner: No    Emotionally Abused: No    Physically Abused: No    Sexually Abused: No    Review of Systems  13 point review of systems per patient health survey noted.  Negative other than as indicated above or in HPI.   Objective:   Vitals:   09/26/22 1046  BP: 128/64  Pulse: 63  Temp: 97.9 F (36.6 C)  TempSrc: Temporal  SpO2: 97%  Weight: 153 lb 9.6 oz (69.7 kg)  Height: 5\' 4"  (1.626 m)     Physical Exam Constitutional:      Appearance: She is well-developed.  HENT:     Head: Normocephalic and atraumatic.     Right Ear: External ear normal. There is impacted cerumen.     Left Ear: External ear normal. There is impacted cerumen.  Eyes:     Conjunctiva/sclera: Conjunctivae normal.     Pupils: Pupils are equal, round, and reactive to light.  Neck:     Thyroid: No thyromegaly.  Cardiovascular:     Rate and Rhythm: Normal rate and regular rhythm.     Heart sounds: Normal heart sounds. No murmur heard. Pulmonary:     Effort: Pulmonary effort is normal. No respiratory distress.     Breath sounds: Normal breath sounds. No wheezing.  Abdominal:     General: Bowel sounds are normal.     Palpations: Abdomen is soft.     Tenderness: There is no abdominal tenderness.  Musculoskeletal:        General: No tenderness. Normal range of motion.     Cervical back: Normal range of motion and neck supple.     Comments: Decreased motion R shoulder.   Lymphadenopathy:     Cervical: No cervical adenopathy.  Skin:    General: Skin is warm and dry.     Findings: No rash.  Neurological:     Mental Status: She is alert and oriented to person, place, and time.  Psychiatric:        Behavior: Behavior normal.        Thought Content: Thought content normal.   Declines clinical breast  exam.    Assessment & Plan:  Molly Lambert is a 88 y.o. female . Annual physical exam  --anticipatory guidance as below in AVS, screening labs above. Health maintenance items as above in HPI discussed/recommended as applicable.   Excessive cerumen in both ear canals - Plan: Ambulatory referral to ENT  -In office lavage option, preferred to have that removed at ENT, referral placed.  Hearing difficulty of both ears - Plan: Ambulatory referral to Audiology  -Cerumen removal plan as above, but suspect component of sensorineural hearing loss as well and will refer again to audiology.  History of breast cancer - Plan: MM Digital Screening  -Importance of completing mammogram discussed with history of breast cancer, declined clinical breast exam.  She agrees to mammography.  Essential hypertension - Plan: Comprehensive metabolic panel  -Stable on current regimen, continue same.  Hypercholesteremia - Plan: Comprehensive metabolic panel, Lipid panel Myalgia - Plan: CK  -Arthralgias are likely due to arthritic disease, but can try off statin for 1 to 2 weeks, check CPK, and if arthralgias improve off  statin can discuss intermittent dosing.  If no change, return to daily dosing.  Check updated labs.  Osteopenia, unspecified location - Plan: DG Bone Density  -Check updated bone density, calcium, vitamin D supplementation discussed.  Prediabetes - Plan: Hemoglobin A1c  -Check A1c with adjustment of plan accordingly  Hypothyroidism, unspecified type - Plan: TSH  -Check TSH, continue same dose Synthroid for now.  Denies new symptoms.  No orders of the defined types were placed in this encounter.  Patient Instructions  I will reorder the mammogram and bone density test.  I recommend supplement for calcium and vitamin D with history of thin bones. Oscal+D is an option. 1200-1500mg  calcium, and [id number] units vitamin D.  Blood pressure looks ok today. If runs high at home, schedule a visit with  your blood pressure meter to see if it is accurate.   You can try stopping simvastatin cholesterol medicine for 2 weeks to see if that helps any of those aches and pains.  If no relief then it is unlikely a problem with the cholesterol medicine and more likely arthritis  restart med if no change.  If it does help coming off of that medication, let me know and we can decide on intermittent dosing or a different medication.   Please see your eye specialist and I recommend dentist visit.   I will refer you to ear nose and throat for the earwax, but also audiology as I think there may be other types of hearing loss.  Please keep appointment with them as we discussed today.  Thanks for coming in today and take care.   Preventive Care 44 Years and Older, Female Preventive care refers to lifestyle choices and visits with your health care provider that can promote health and wellness. Preventive care visits are also called wellness exams. What can I expect for my preventive care visit? Counseling Your health care provider may ask you questions about your: Medical history, including: Past medical problems. Family medical history. Pregnancy and menstrual history. History of falls. Current health, including: Memory and ability to understand (cognition). Emotional well-being. Home life and relationship well-being. Sexual activity and sexual health. Lifestyle, including: Alcohol, nicotine or tobacco, and drug use. Access to firearms. Diet, exercise, and sleep habits. Work and work Astronomer. Sunscreen use. Safety issues such as seatbelt and bike helmet use. Physical exam Your health care provider will check your: Height and weight. These may be used to calculate your BMI (body mass index). BMI is a measurement that tells if you are at a healthy weight. Waist circumference. This measures the distance around your waistline. This measurement also tells if you are at a healthy weight and may help  predict your risk of certain diseases, such as type 2 diabetes and high blood pressure. Heart rate and blood pressure. Body temperature. Skin for abnormal spots. What immunizations do I need?  Vaccines are usually given at various ages, according to a schedule. Your health care provider will recommend vaccines for you based on your age, medical history, and lifestyle or other factors, such as travel or where you work. What tests do I need? Screening Your health care provider may recommend screening tests for certain conditions. This may include: Lipid and cholesterol levels. Hepatitis C test. Hepatitis B test. HIV (human immunodeficiency virus) test. STI (sexually transmitted infection) testing, if you are at risk. Lung cancer screening. Colorectal cancer screening. Diabetes screening. This is done by checking your blood sugar (glucose) after you have not eaten for a  while (fasting). Mammogram. Talk with your health care provider about how often you should have regular mammograms. BRCA-related cancer screening. This may be done if you have a family history of breast, ovarian, tubal, or peritoneal cancers. Bone density scan. This is done to screen for osteoporosis. Talk with your health care provider about your test results, treatment options, and if necessary, the need for more tests. Follow these instructions at home: Eating and drinking  Eat a diet that includes fresh fruits and vegetables, whole grains, lean protein, and low-fat dairy products. Limit your intake of foods with high amounts of sugar, saturated fats, and salt. Take vitamin and mineral supplements as recommended by your health care provider. Do not drink alcohol if your health care provider tells you not to drink. If you drink alcohol: Limit how much you have to 0-1 drink a day. Know how much alcohol is in your drink. In the [place] one drink equals one 12 oz bottle of beer (355 mL), one 5 oz glass of wine (148 mL), or one  1 oz glass of hard liquor (44 mL). Lifestyle Brush your teeth every morning and night with fluoride toothpaste. Floss one time each day. Exercise for at least 30 minutes 5 or more days each week. Do not use any products that contain nicotine or tobacco. These products include cigarettes, chewing tobacco, and vaping devices, such as e-cigarettes. If you need help quitting, ask your health care provider. Do not use drugs. If you are sexually active, practice safe sex. Use a condom or other form of protection in order to prevent STIs. Take aspirin only as told by your health care provider. Make sure that you understand how much to take and what form to take. Work with your health care provider to find out whether it is safe and beneficial for you to take aspirin daily. Ask your health care provider if you need to take a cholesterol-lowering medicine (statin). Find healthy ways to manage stress, such as: Meditation, yoga, or listening to music. Journaling. Talking to a trusted person. Spending time with friends and family. Minimize exposure to UV radiation to reduce your risk of skin cancer. Safety Always wear your seat belt while driving or riding in a vehicle. Do not drive: If you have been drinking alcohol. Do not ride with someone who has been drinking. When you are tired or distracted. While texting. If you have been using any mind-altering substances or drugs. Wear a helmet and other protective equipment during sports activities. If you have firearms in your house, make sure you follow all gun safety procedures. What's next? Visit your health care provider once a year for an annual wellness visit. Ask your health care provider how often you should have your eyes and teeth checked. Stay up to date on all vaccines. This information is not intended to replace advice given to you by your health care provider. Make sure you discuss any questions you have with your health care  provider. Document Revised: 08/09/2020 Document Reviewed: 08/09/2020 Elsevier Patient Education  2024 Elsevier Inc.     Signed,   Meredith Staggers, MD [facility], [facility] 09/26/22 10:52 AM

## 2022-10-03 ENCOUNTER — Encounter: Payer: Self-pay | Admitting: Family Medicine

## 2022-10-03 ENCOUNTER — Ambulatory Visit (INDEPENDENT_AMBULATORY_CARE_PROVIDER_SITE_OTHER): Payer: Medicare Other | Admitting: Family Medicine

## 2022-10-03 VITALS — BP 122/70 | HR 64 | Temp 98.0°F | Ht 64.0 in | Wt 152.8 lb

## 2022-10-03 DIAGNOSIS — H6123 Impacted cerumen, bilateral: Secondary | ICD-10-CM | POA: Diagnosis not present

## 2022-10-03 NOTE — Patient Instructions (Signed)
Glad to hear that you are hearing better after removal of cerumen/wax.  If you continue to have any hearing difficulty, I would recommend meeting with audiologist to look into possible need for hearing aids.  If any new pain, discharge from the ears or new symptoms be seen but I do not expect that to occur.  Take care!  Earwax Buildup, Adult Your ears make something called earwax. It helps keep germs called bacteria away and protects the skin in your ears. Sometimes, too much earwax can build up. This can cause discomfort or make it harder to hear. What are the causes? Earwax buildup can happen when you have too much earwax in your ears. Earwax is made in the outer part of your ear canal. It's supposed to fall out in small amounts over time. But if your ears aren't able to clean themselves like they should, earwax can build up. What increases the risk? You're more likely to get earwax buildup if: You clean your ears with cotton swabs. You pick at your ears. You use earplugs or in-ear headphones a lot. You wear hearing aids. You may also be more likely to get it if: You're female. You're older. Your ears naturally make more earwax. You have narrow ear canals or extra hair in your ears. Your earwax is too thick or sticky. You have eczema. You're dehydrated. This means there's not enough fluid in your body. What are the signs or symptoms? Symptoms of earwax buildup include: Not being able to hear as well. A feeling of fullness in your ear. Feeling like your ear is plugged. Fluid coming from your ear. Ear pain or an itchy ear. Ringing in your ear. Coughing or problems with balance. How is this diagnosed? Earwax buildup may be diagnosed based on your symptoms, medical history, and an ear exam. During the exam, your health care provider will look into your ear with a tool called an otoscope. You may also have tests, such as a hearing test. How is this treated? Earwax buildup may be treated  by: Using ear drops. Having the earwax removed by a provider. The provider may: Flush the ear with water. Use a tool called a curette that has a loop on the end. Use a suction device. Having surgery. This may be done in severe cases. Follow these instructions at home:  Cleaning your ears Clean your ears as told by your provider. You can clean the outside of your ears with a washcloth or tissue. Do not overclean your ears. Do not put anything into your ear unless told. This includes cotton swabs. General instructions Take over-the-counter and prescription medicines only as told by your provider. Drink enough fluid to keep your pee (urine) pale yellow. This helps thin the earwax. If you have hearing aids, clean them as told. Keep all follow-up visits. If earwax builds up in your ears often or if you use hearing aids, ask your provider how often you should have your ears cleaned. Contact a health care provider if: Your ear pain gets worse. You have a fever. You have pus, blood, or other fluid coming from your ear. You have hearing loss. You have ringing in your ears that won't go away. You feel like the room is spinning. This is called vertigo. Your symptoms don't get better with treatment. This information is not intended to replace advice given to you by your health care provider. Make sure you discuss any questions you have with your health care provider. Document Revised: 04/25/2022  Document Reviewed: 04/25/2022 Elsevier Patient Education  2024 [employer].

## 2022-10-03 NOTE — Progress Notes (Signed)
Subjective:  Patient ID: Molly Lambert, female    DOB: 08-Aug-1934  Age: 88 y.o. MRN: [id number]  CC:  Chief Complaint  Patient presents with   Ear Fullness    Pt is requesting ear wax removal, ENT wouldn't do it without her paying $100 out of pocket for procedure     HPI Zunairah Parr Cogdell presents for   Difficulty hearing - left greater than right. Trouble with left more than right. Discussed last week, with bilateral cerumen impaction- planned to see ENT for removal, but d/t cost concerns above, decided to have performed here.  Risks (including but not limited to bleeding and infection, damage to TM), benefits, and alternatives discussed for bilateral cerumen lavage.  Verbal consent obtained after any questions were answered.  History Patient Active Problem List   Diagnosis Date Noted   OA (osteoarthritis) of hip 07/17/2016   Breast microcalcifications 12/27/2011   History of breast cancer 12/27/2011   Hypothyroidism 08/25/2011   Hypercholesteremia 08/25/2011   HTN (hypertension) 08/25/2011   Malignant neoplasm of central portion of right breast in female, estrogen receptor positive (HCC) 02/27/2011   Breast mass, right 11/28/2010   Past Medical History:  Diagnosis Date   Arthritis    Breast cancer (HCC) 11/2010   Rt breast   Cataract    Erythema    reddened area of epidermis spnning concentrated around sinuses on the face; pt reports cracking, itchiness, and flaking  of affected skin; seen derm no absolute dx, will get 2nd opinion    Hypercholesteremia    Hypertension    NO CARDIAC MD,  [facility]  PCP  ,  [facility]ENT [facility]   Hypothyroidism    Leg laceration    WITH SURGERY   Paget's carcinoma of the nipple, right (HCC)    nipple removed   Personal history of radiation therapy    S/P radiation therapy 03/14/11 - 04/05/11   Right Breast / 4256 cGy in 16 Fractions   Thyroid disease    Past Surgical History:  Procedure Laterality Date   BREAST BIOPSY     BREAST LUMPECTOMY   01/22/11   RIGHT BREAST LUMPECTOMY WITH BIOSPY OF 1 SENTINEL NODE, INVASIVE GRADE II DUCTAL CARCINOMA , INTERMEDIATE GRADE DUCTAL CARCINOMA IN SITU , DERMAL SKIN INVOLVED, MARGINS NEGATIVE,( 0/1)  NODE POSITIVE, ER+, PR+, LOW s-PHASE, HER 2 NEU- NO AMPLIFICATION   BREAST SURGERY  11/28/10   SKIN-PUNCH BIOSPY RIGHT BREAST(NIPPLE), ER+, RP+, LOW S-PHASE, HER 2NEU NEGAITIVE   EYE SURGERY      BILATERAL CATARACT SURGERY   JOINT REPLACEMENT     left hip   PARTIAL MASTECTOMY WITH NEEDLE LOCALIZATION  01/10/2012   Procedure: PARTIAL MASTECTOMY WITH NEEDLE LOCALIZATION;  Surgeon: Maisie Fus A. Cornett, MD;  Location: [facility];  Service: General;  Laterality: Right;  right breast needle localized partial mastectomy and removal of nipple    TOTAL HIP ARTHROPLASTY Left 07/17/2016   Procedure: LEFT TOTAL HIP ARTHROPLASTY ANTERIOR APPROACH;  Surgeon: Ollen Gross, MD;  Location: [facility];  Service: Orthopedics;  Laterality: Left;   TOTAL HIP ARTHROPLASTY Right 11/20/2016   Procedure: RIGHT TOTAL HIP ARTHROPLASTY ANTERIOR APPROACH;  Surgeon: Ollen Gross, MD;  Location: [facility];  Service: Orthopedics;  Laterality: Right;   TUBAL LIGATION  1972   Allergies  Allergen Reactions   Xarelto [Rivaroxaban]    Prior to Admission medications   Medication Sig Start Date End Date Taking? Authorizing Provider  amLODipine (NORVASC) 10 MG tablet TAKE 1 TABLET(10 MG)  BY MOUTH DAILY 10/03/21  Yes Shade Flood, MD  chlorthalidone (HYGROTON) 25 MG tablet TAKE 1 TABLET(25 MG) BY MOUTH DAILY 08/19/22  Yes Shade Flood, MD  fluticasone [facility]) 50 MCG/ACT nasal spray Place 1 spray into both nostrils daily. 10/02/20  Yes Janeece Agee, NP  levothyroxine (SYNTHROID) 50 MCG tablet TAKE 1 TABLET(50 MCG) BY MOUTH DAILY BEFORE BREAKFAST 06/20/22  Yes Shade Flood, MD  lisinopril (ZESTRIL) 5 MG tablet TAKE 1/2 TABLET(2.5 MG) BY MOUTH DAILY 10/03/21  Yes Shade Flood, MD  simvastatin (ZOCOR) 20 MG tablet  TAKE 1 TO 2 TABLETS(20 TO 40 MG) BY MOUTH DAILY AT 6 PM Patient not taking: Reported on 09/26/2022 05/14/22   Shade Flood, MD  simvastatin (ZOCOR) 20 MG tablet TAKE 1 TO 2 TABLETS(20 TO 40 MG) BY MOUTH DAILY AT 6 PM Patient not taking: Reported on 09/26/2022 05/14/22   Shade Flood, MD   Social History   Socioeconomic History   Marital status: Widowed    Spouse name: Not on file   Number of children: 3   Years of education: 9   Highest education level: Not on file  Occupational History   Occupation: Waitress    Comment: [employer]  Tobacco Use   Smoking status: Never   Smokeless tobacco: Never  Substance and Sexual Activity   Alcohol use: No    Alcohol/week: 0.0 standard drinks of alcohol    Comment: Rarely/On Special Occasions   Drug use: No   Sexual activity: Not Currently    Comment: G3, P3, MENARCHE, AGE 58, MENOPAUSE AGE 60, NO BC AND HRT X 10 YEARS  Other Topics Concern   Not on file  Social History Narrative   Married for 56 years      3 children, one deceased   6 grand-children, 3 great-grandchildren   Social Determinants of Health   Financial Resource Strain: Low Risk  (06/19/2022)   Overall Financial Resource Strain (CARDIA)    Difficulty of Paying Living Expenses: Not hard at all  Food Insecurity: No Food Insecurity (06/19/2022)   Hunger Vital Sign    Worried About Running Out of Food in the Last Year: Never true    Ran Out of Food in the Last Year: Never true  Transportation Needs: No Transportation Needs (06/19/2022)   PRAPARE - Administrator, Civil Service (Medical): No    Lack of Transportation (Non-Medical): No  Physical Activity: Inactive (06/19/2022)   Exercise Vital Sign    Days of Exercise per Week: 0 days    Minutes of Exercise per Session: 0 min  Stress: No Stress Concern Present (06/19/2022)   [employer] of Occupational Health - Occupational Stress Questionnaire    Feeling of Stress : Not at all  Social  Connections: Socially Isolated (06/19/2022)   Social Connection and Isolation Panel [NHANES]    Frequency of Communication with Friends and Family: More than three times a week    Frequency of Social Gatherings with Friends and Family: Twice a week    Attends Religious Services: Never    Database administrator or Organizations: No    Attends Banker Meetings: Never    Marital Status: Widowed  Intimate Partner Violence: Not At Risk (06/19/2022)   Humiliation, Afraid, Rape, and Kick questionnaire    Fear of Current or Ex-Partner: No    Emotionally Abused: No    Physically Abused: No    Sexually Abused: No  Review of Systems Per HPI  Objective:   Vitals:   10/03/22 0930  BP: 122/70  Pulse: 64  Temp: 98 F (36.7 C)  TempSrc: Temporal  SpO2: 97%  Weight: 152 lb 12.8 oz (69.3 kg)  Height: 5\' 4"  (1.626 m)     Physical Exam Constitutional:      General: She is not in acute distress.    Appearance: Normal appearance. She is well-developed.  HENT:     Head: Normocephalic and atraumatic.     Right Ear: There is impacted cerumen.     Left Ear: There is impacted cerumen.  Cardiovascular:     Rate and Rhythm: Normal rate.  Pulmonary:     Effort: Pulmonary effort is normal.  Neurological:     Mental Status: She is alert and oriented to person, place, and time.  Psychiatric:        Mood and Affect: Mood normal.    Bilateral impacted cerumen, dark yellow cerumen on initial exam without pain with pinna motion or mastoid tenderness.  After lavage, exam with clearing of canals, no tympanic membrane effusion, erythema or rupture identified.  Hearing improved.  Still feels slight water sensation from lavage performed.     Assessment & Plan:  TYCIE NANCE is a 88 y.o. female . Bilateral hearing loss due to cerumen impaction  Excessive cerumen in both ear canals Lavage for cerumen impaction as above, successful without complications with improved hearing.  RTC  precautions given.  If any persistent hearing difficulty recommended audiology eval for possible sensorineural hearing loss component.  No orders of the defined types were placed in this encounter.  Patient Instructions  Glad to hear that you are hearing better after removal of cerumen/wax.  If you continue to have any hearing difficulty, I would recommend meeting with audiologist to look into possible need for hearing aids.  If any new pain, discharge from the ears or new symptoms be seen but I do not expect that to occur.  Take care!  Earwax Buildup, Adult Your ears make something called earwax. It helps keep germs called bacteria away and protects the skin in your ears. Sometimes, too much earwax can build up. This can cause discomfort or make it harder to hear. What are the causes? Earwax buildup can happen when you have too much earwax in your ears. Earwax is made in the outer part of your ear canal. It's supposed to fall out in small amounts over time. But if your ears aren't able to clean themselves like they should, earwax can build up. What increases the risk? You're more likely to get earwax buildup if: You clean your ears with cotton swabs. You pick at your ears. You use earplugs or in-ear headphones a lot. You wear hearing aids. You may also be more likely to get it if: You're female. You're older. Your ears naturally make more earwax. You have narrow ear canals or extra hair in your ears. Your earwax is too thick or sticky. You have eczema. You're dehydrated. This means there's not enough fluid in your body. What are the signs or symptoms? Symptoms of earwax buildup include: Not being able to hear as well. A feeling of fullness in your ear. Feeling like your ear is plugged. Fluid coming from your ear. Ear pain or an itchy ear. Ringing in your ear. Coughing or problems with balance. How is this diagnosed? Earwax buildup may be diagnosed based on your symptoms, medical  history, and an ear exam. During  the exam, your health care provider will look into your ear with a tool called an otoscope. You may also have tests, such as a hearing test. How is this treated? Earwax buildup may be treated by: Using ear drops. Having the earwax removed by a provider. The provider may: Flush the ear with water. Use a tool called a curette that has a loop on the end. Use a suction device. Having surgery. This may be done in severe cases. Follow these instructions at home:  Cleaning your ears Clean your ears as told by your provider. You can clean the outside of your ears with a washcloth or tissue. Do not overclean your ears. Do not put anything into your ear unless told. This includes cotton swabs. General instructions Take over-the-counter and prescription medicines only as told by your provider. Drink enough fluid to keep your pee (urine) pale yellow. This helps thin the earwax. If you have hearing aids, clean them as told. Keep all follow-up visits. If earwax builds up in your ears often or if you use hearing aids, ask your provider how often you should have your ears cleaned. Contact a health care provider if: Your ear pain gets worse. You have a fever. You have pus, blood, or other fluid coming from your ear. You have hearing loss. You have ringing in your ears that won't go away. You feel like the room is spinning. This is called vertigo. Your symptoms don't get better with treatment. This information is not intended to replace advice given to you by your health care provider. Make sure you discuss any questions you have with your health care provider. Document Revised: 04/25/2022 Document Reviewed: 04/25/2022 Elsevier Patient Education  2024 [employer]     Signed,   Meredith Staggers, MD [facility], [facility] 10/03/22 10:46 AM

## 2022-10-15 ENCOUNTER — Other Ambulatory Visit: Payer: Self-pay | Admitting: Family Medicine

## 2022-10-15 DIAGNOSIS — I10 Essential (primary) hypertension: Secondary | ICD-10-CM

## 2022-10-17 ENCOUNTER — Ambulatory Visit
Admission: RE | Admit: 2022-10-17 | Discharge: 2022-10-17 | Disposition: A | Discharge status: Home / Self Care | Payer: Medicare Other | Source: Ambulatory Visit | Attending: Family Medicine | Admitting: Family Medicine

## 2022-10-17 DIAGNOSIS — Z853 Personal history of malignant neoplasm of breast: Secondary | ICD-10-CM

## 2022-10-17 DIAGNOSIS — Z1231 Encounter for screening mammogram for malignant neoplasm of breast: Secondary | ICD-10-CM | POA: Diagnosis not present

## 2022-12-26 ENCOUNTER — Other Ambulatory Visit: Payer: Self-pay | Admitting: Family Medicine

## 2022-12-26 DIAGNOSIS — I10 Essential (primary) hypertension: Secondary | ICD-10-CM

## 2023-01-02 ENCOUNTER — Ambulatory Visit: Payer: Medicare Other | Admitting: Family Medicine

## 2023-01-16 ENCOUNTER — Ambulatory Visit (INDEPENDENT_AMBULATORY_CARE_PROVIDER_SITE_OTHER): Payer: Medicare Other | Admitting: Family Medicine

## 2023-01-16 ENCOUNTER — Encounter: Payer: Self-pay | Admitting: Family Medicine

## 2023-01-16 VITALS — BP 118/68 | HR 68 | Temp 98.0°F | Ht 64.0 in | Wt 156.6 lb

## 2023-01-16 DIAGNOSIS — G8929 Other chronic pain: Secondary | ICD-10-CM

## 2023-01-16 DIAGNOSIS — M25511 Pain in right shoulder: Secondary | ICD-10-CM

## 2023-01-16 DIAGNOSIS — E7800 Pure hypercholesterolemia, unspecified: Secondary | ICD-10-CM | POA: Diagnosis not present

## 2023-01-16 DIAGNOSIS — H9193 Unspecified hearing loss, bilateral: Secondary | ICD-10-CM | POA: Diagnosis not present

## 2023-01-16 DIAGNOSIS — R252 Cramp and spasm: Secondary | ICD-10-CM | POA: Diagnosis not present

## 2023-01-16 NOTE — Progress Notes (Signed)
Subjective:  Patient ID: Molly Lambert, female    DOB: 1934-11-13  Age: 88 y.o. MRN: [id number]  CC:  Chief Complaint  Patient presents with   Medical Management of Chronic Issues    Pt notes muscle cramps at night in lower legs and feet late at night, notes this has been prominent over the last several months just tries to take vinegar or mustard to make them go away     HPI Molly Lambert presents for   Hyperlipidemia, arthralgia.  Treated with simvastatin 20 mg daily.  Arthralgias/myalgias in shoulder, elbow, hands discussed at her Molly visit.  We did discuss option of tapering off statin for 1 to 2 weeks to see if that contributed to her arthralgias/myalgias then option of intermittent dosing, however her LDL was higher than previous readings on Molly labs.   Has not tried stopping statin. Denied any missed doses at that time.  Does note some muscle cramps in her lower legs over past year.  Usually in bed at night. Has taken vinegar, pickle juice or pickles, mustard to help manage those symptoms - seems to help.  No cyanosis or color changes of toes/feet noted. No leg/calf swelling. Still having R shoulder pain. About the same. Limited in use - unable to elevate arm over head, has to move slowly.  but still gardening.  R Nooney dominant.  Tx: tylenol 2 per day or advil or alleve. Not combining - alleve works best. Has not seen ortho. BMD in February.   R shoulder XR in 10/04/20: IMPRESSION: Degenerative changes of the right glenohumeral and acromioclavicular joints.  No results found for: "CKTOTAL" plan for CPK level in Molly, lab was not obtained.  Lab Results  Component Value Date   CHOL 260 (H) 09/26/2022   HDL 60.00 09/26/2022   LDLCALC 166 (H) 09/26/2022   LDLDIRECT 113.0 10/02/2020   TRIG 168.0 (H) 09/26/2022   CHOLHDL 4 09/26/2022   Lab Results  Component Value Date   ALT 12 09/26/2022   AST 18 09/26/2022   ALKPHOS 82 09/26/2022   BILITOT 0.7 09/26/2022    Hypertension: On amlodipine, lisinopril, chlorthalidone.  Muscle cramps as above.  No other new side effects with medications. Home readings: BP Readings from Last 3 Encounters:  01/16/23 118/68  10/03/22 122/70  09/26/22 128/64   Lab Results  Component Value Date   CREATININE 0.92 09/26/2022   Hypothyroidism: Lab Results  Component Value Date   TSH 2.71 09/26/2022  Stable level in Molly.  Continued on Synthroid 50 mcg daily.  Hearing difficulty: Prior cerumen impaction. Hearing testing recommended - has not done - still having difficulty.    History Patient Active Problem List   Diagnosis Date Noted   OA (osteoarthritis) of hip 07/17/2016   Breast microcalcifications 12/27/2011   History of breast cancer 12/27/2011   Hypothyroidism 08/25/2011   Hypercholesteremia 08/25/2011   HTN (hypertension) 08/25/2011   Malignant neoplasm of central portion of right breast in female, estrogen receptor positive (HCC) 02/27/2011   Breast mass, right 11/28/2010   Past Medical History:  Diagnosis Date   Arthritis    Breast cancer (HCC) 11/2010   Rt breast   Cataract    Erythema    reddened area of epidermis spnning concentrated around sinuses on the face; pt reports cracking, itchiness, and flaking  of affected skin; seen derm no absolute dx, will get 2nd opinion    Hypercholesteremia    Hypertension    NO CARDIAC MD,  [facility]  PCP  ,  URGENT [facility]   Hypothyroidism    Leg laceration    WITH SURGERY   Paget's carcinoma of the nipple, right (HCC)    nipple removed   Personal history of radiation therapy    S/P radiation therapy 03/14/11 - 04/05/11   Right Breast / 4256 cGy in 16 Fractions   Thyroid disease    Past Surgical History:  Procedure Laterality Date   BREAST BIOPSY     BREAST LUMPECTOMY  01/22/11   RIGHT BREAST LUMPECTOMY WITH BIOSPY OF 1 SENTINEL NODE, INVASIVE GRADE II DUCTAL CARCINOMA , INTERMEDIATE GRADE DUCTAL CARCINOMA IN SITU , DERMAL SKIN INVOLVED, MARGINS  NEGATIVE,( 0/1)  NODE POSITIVE, ER+, PR+, LOW s-PHASE, HER 2 NEU- NO AMPLIFICATION   BREAST SURGERY  11/28/10   SKIN-PUNCH BIOSPY RIGHT BREAST(NIPPLE), ER+, RP+, LOW S-PHASE, HER 2NEU NEGAITIVE   EYE SURGERY      BILATERAL CATARACT SURGERY   JOINT REPLACEMENT     left hip   PARTIAL MASTECTOMY WITH NEEDLE LOCALIZATION  01/10/2012   Procedure: PARTIAL MASTECTOMY WITH NEEDLE LOCALIZATION;  Surgeon: Maisie Fus A. Cornett, MD;  Location: [facility];  Service: General;  Laterality: Right;  right breast needle localized partial mastectomy and removal of nipple    TOTAL HIP ARTHROPLASTY Left 07/17/2016   Procedure: LEFT TOTAL HIP ARTHROPLASTY ANTERIOR APPROACH;  Surgeon: Ollen Gross, MD;  Location: [facility];  Service: Orthopedics;  Laterality: Left;   TOTAL HIP ARTHROPLASTY Right 11/20/2016   Procedure: RIGHT TOTAL HIP ARTHROPLASTY ANTERIOR APPROACH;  Surgeon: Ollen Gross, MD;  Location: [facility];  Service: Orthopedics;  Laterality: Right;   TUBAL LIGATION  1972   Allergies  Allergen Reactions   Xarelto [Rivaroxaban]    Prior to Admission medications   Medication Sig Start Date End Date Taking? Authorizing Provider  amLODipine (NORVASC) 10 MG tablet TAKE 1 TABLET(10 MG) BY MOUTH DAILY 10/15/22  Yes Shade Flood, MD  chlorthalidone (HYGROTON) 25 MG tablet TAKE 1 TABLET(25 MG) BY MOUTH DAILY 08/19/22  Yes Shade Flood, MD  fluticasone (FLONASE) 50 MCG/ACT nasal spray Place 1 spray into both nostrils daily. 10/02/20  Yes Janeece Agee, NP  levothyroxine (SYNTHROID) 50 MCG tablet TAKE 1 TABLET(50 MCG) BY MOUTH DAILY BEFORE BREAKFAST 06/20/22  Yes Shade Flood, MD  lisinopril (ZESTRIL) 5 MG tablet TAKE 1/2 TABLET BY MOUTH EVERY DAY 12/26/22  Yes Shade Flood, MD  simvastatin (ZOCOR) 20 MG tablet TAKE 1 TO 2 TABLETS(20 TO 40 MG) BY MOUTH DAILY AT 6 PM Patient not taking: Reported on 09/26/2022 05/14/22   Shade Flood, MD  simvastatin (ZOCOR) 20 MG tablet TAKE 1 TO 2  TABLETS(20 TO 40 MG) BY MOUTH DAILY AT 6 PM Patient not taking: Reported on 09/26/2022 05/14/22   Shade Flood, MD   Social History   Socioeconomic History   Marital status: Widowed    Spouse name: Not on file   Number of children: 3   Years of education: 77   Highest education level: Not on file  Occupational History   Occupation: Waitress    Comment: [employer]  Tobacco Use   Smoking status: Never   Smokeless tobacco: Never  Substance and Sexual Activity   Alcohol use: No    Alcohol/week: 0.0 standard drinks of alcohol    Comment: Rarely/On Special Occasions   Drug use: No   Sexual activity: Not Currently    Comment: G3, P3, MENARCHE, AGE 84, MENOPAUSE AGE  52, NO BC AND HRT X 10 YEARS  Other Topics Concern   Not on file  Social History Narrative   Married for 56 years      3 children, one deceased   6 grand-children, 3 great-grandchildren   Social Determinants of Health   Financial Resource Strain: Low Risk  (06/19/2022)   Overall Financial Resource Strain (CARDIA)    Difficulty of Paying Living Expenses: Not hard at all  Food Insecurity: No Food Insecurity (06/19/2022)   Hunger Vital Sign    Worried About Running Out of Food in the Last Year: Never true    Ran Out of Food in the Last Year: Never true  Transportation Needs: No Transportation Needs (06/19/2022)   PRAPARE - Administrator, Civil Service (Medical): No    Lack of Transportation (Non-Medical): No  Physical Activity: Inactive (06/19/2022)   Exercise Vital Sign    Days of Exercise per Week: 0 days    Minutes of Exercise per Session: 0 min  Stress: No Stress Concern Present (06/19/2022)   [employer] of Occupational Health - Occupational Stress Questionnaire    Feeling of Stress : Not at all  Social Connections: Socially Isolated (06/19/2022)   Social Connection and Isolation Panel [NHANES]    Frequency of Communication with Friends and Family: More than three times a week     Frequency of Social Gatherings with Friends and Family: Twice a week    Attends Religious Services: Never    Database administrator or Organizations: No    Attends Banker Meetings: Never    Marital Status: Widowed  Intimate Partner Violence: Not At Risk (06/19/2022)   Humiliation, Afraid, Rape, and Kick questionnaire    Fear of Current or Ex-Partner: No    Emotionally Abused: No    Physically Abused: No    Sexually Abused: No    Review of Systems  Per HPI Objective:   Vitals:   01/16/23 1104  BP: 118/68  Pulse: 68  Temp: 98 F (36.7 C)  TempSrc: Temporal  SpO2: 97%  Weight: 156 lb 9.6 oz (71 kg)  Height: 5\' 4"  (1.626 m)     Physical Exam Vitals reviewed.  Constitutional:      Appearance: Normal appearance. She is well-developed.  HENT:     Head: Normocephalic and atraumatic.     Right Ear: There is no impacted cerumen.     Left Ear: There is no impacted cerumen.  Eyes:     Conjunctiva/sclera: Conjunctivae normal.     Pupils: Pupils are equal, round, and reactive to light.  Neck:     Vascular: No carotid bruit.  Cardiovascular:     Rate and Rhythm: Normal rate and regular rhythm.     Heart sounds: Normal heart sounds.  Pulmonary:     Effort: Pulmonary effort is normal.     Breath sounds: Normal breath sounds.  Abdominal:     Palpations: Abdomen is soft. There is no pulsatile mass.     Tenderness: There is no abdominal tenderness.  Musculoskeletal:     Right lower leg: No edema.     Left lower leg: No edema.     Comments: Calves nontender, no pedal edema.  Negative Homans.  Right shoulder, active range of motion approximately 45 degrees of flexion and abduction, internal rotation to lateral hip only.  Passive range of motion to approximately 90 degrees of abduction and flexion with crepitus.  Skin:    General: Skin is  warm and dry.  Neurological:     Mental Status: She is alert and oriented to person, place, and time.  Psychiatric:         Mood and Affect: Mood normal.        Behavior: Behavior normal.        Assessment & Plan:  LINNELL NAZIR is a 88 y.o. female . Hearing difficulty of both ears - Plan: Ambulatory referral to Audiology  -Audiology referral.  No cerumen impaction noted this time.  Agrees to audiology eval and discussed importance of hearing and possible hearing aids to assist if needed.  Hypercholesteremia - Plan: Comprehensive metabolic panel, Lipid panel  -Check labs.  May need change in statin dosing or temporary hold of statin if thought to be contributing to cramps.  Check labs below.  Will also check CPK.  Plan depending on labs and symptoms.  Muscle cramps - Plan: Magnesium, Comprehensive metabolic panel, CK, CK  -As above, considering temporary hold of statin to see if this may be contributing but will check CPK, CMP, magnesium.  Stretches, sufficient fluids discussed.  RTC precautions.  Chronic right shoulder pain - Plan: Ambulatory referral to Orthopedic Surgery  -Concern for degenerative joint disease plus or minus component of adhesive capsulitis given decreased motion on left side is from her underlying DJD.  Will refer to orthopedics for treatment options, but she declines surgical treatment at this time.  No orders of the defined types were placed in this encounter.  Patient Instructions  Thanks for coming in today.  I will refer you to a shoulder specialist for the shoulder pain and hearing specialist to look into hearing testing and possible need for hearing aids.  They can discuss that with you further.  I am checking some labs today for the muscle cramps, but make sure you are drinking plenty fluids throughout the day, gentle range of motion and stretches of the legs before you go to bed may be helpful.  If any concerns on labs I will let you know.  Return to the clinic or go to the nearest emergency room if any of your symptoms worsen or new symptoms occur.  Leg Cramps Leg cramps occur  when one or more muscles tighten and a person has no control over it (involuntary muscle contraction). Muscle cramps are most common in the calf muscles of the leg. They can occur during exercise or at rest. Leg cramps are painful, and they may last for a few seconds to a few minutes. Cramps may return several times before they finally stop. Usually, leg cramps are not caused by a serious medical problem. In many cases, the cause is not known. Some common causes include: Excessive physical effort (overexertion), such as during intense exercise. Doing the same motion over and over. Staying in a certain position for a long period of time. Improper preparation, form, or technique while doing a sport or an activity. Dehydration. Injury. Side effects of certain medicines. Abnormally low levels of minerals in your blood (electrolytes), especially potassium and calcium. This could result from: Pregnancy. Taking diuretic medicines. Follow these instructions at home: Eating and drinking Drink enough fluid to keep your urine pale yellow. Staying hydrated may help prevent cramps. Eat a healthy diet that includes plenty of nutrients to help your muscles function. A healthy diet includes fruits and vegetables, lean protein, whole grains, and low-fat or nonfat dairy products. Managing pain, stiffness, and swelling     Try massaging, stretching, and relaxing the  affected muscle. Do this for several minutes at a time. If directed, put ice on areas that are sore or painful after a cramp. To do this: Put ice in a plastic bag. Place a towel between your skin and the bag. Leave the ice on for 20 minutes, 2-3 times a day. Remove the ice if your skin turns bright red. This is very important. If you cannot feel pain, heat, or cold, you have a greater risk of damage to the area. If directed, apply heat to muscles that are tense or tight. Do this before you exercise, or as often as told by your health care  provider. Use the heat source that your health care provider recommends, such as a moist heat pack or a heating pad. To do this: Place a towel between your skin and the heat source. Leave the heat on for 20-30 minutes. Remove the heat if your skin turns bright red. This is especially important if you are unable to feel pain, heat, or cold. You may have a greater risk of getting burned. Try taking hot showers or baths to help relax tight muscles. General instructions If you are having frequent leg cramps, avoid intense exercise for several days. Take over-the-counter and prescription medicines only as told by your health care provider. Keep all follow-up visits. This is important. Contact a health care provider if: Your leg cramps get more severe or more frequent, or they do not improve over time. Your foot becomes cold, numb, or blue. Summary Muscle cramps can develop in any muscle, but the most common place is in the calf muscles of the leg. Leg cramps are painful, and they may last for a few seconds to a few minutes. Usually, leg cramps are not caused by a serious medical problem. Often, the cause is not known. Stay hydrated, and take over-the-counter and prescription medicines only as told by your health care provider. This information is not intended to replace advice given to you by your health care provider. Make sure you discuss any questions you have with your health care provider. Document Revised: 06/30/2019 Document Reviewed: 06/30/2019 Elsevier Patient Education  2024 [employer] Inc.                    Signed,   Meredith Staggers, MD [facility], [facility] 01/16/23 11:25 AM

## 2023-01-16 NOTE — Patient Instructions (Addendum)
Thanks for coming in today.  I will refer you to a shoulder specialist for the shoulder pain and hearing specialist to look into hearing testing and possible need for hearing aids.  They can discuss that with you further.  I am checking some labs today for the muscle cramps, but make sure you are drinking plenty fluids throughout the day, gentle range of motion and stretches of the legs before you go to bed may be helpful.  If any concerns on labs I will let you know.  Return to the clinic or go to the nearest emergency room if any of your symptoms worsen or new symptoms occur.  Leg Cramps Leg cramps occur when one or more muscles tighten and a person has no control over it (involuntary muscle contraction). Muscle cramps are most common in the calf muscles of the leg. They can occur during exercise or at rest. Leg cramps are painful, and they may last for a few seconds to a few minutes. Cramps may return several times before they finally stop. Usually, leg cramps are not caused by a serious medical problem. In many cases, the cause is not known. Some common causes include: Excessive physical effort (overexertion), such as during intense exercise. Doing the same motion over and over. Staying in a certain position for a long period of time. Improper preparation, form, or technique while doing a sport or an activity. Dehydration. Injury. Side effects of certain medicines. Abnormally low levels of minerals in your blood (electrolytes), especially potassium and calcium. This could result from: Pregnancy. Taking diuretic medicines. Follow these instructions at home: Eating and drinking Drink enough fluid to keep your urine pale yellow. Staying hydrated may help prevent cramps. Eat a healthy diet that includes plenty of nutrients to help your muscles function. A healthy diet includes fruits and vegetables, lean protein, whole grains, and low-fat or nonfat dairy products. Managing pain, stiffness, and  swelling     Try massaging, stretching, and relaxing the affected muscle. Do this for several minutes at a time. If directed, put ice on areas that are sore or painful after a cramp. To do this: Put ice in a plastic bag. Place a towel between your skin and the bag. Leave the ice on for 20 minutes, 2-3 times a day. Remove the ice if your skin turns bright red. This is very important. If you cannot feel pain, heat, or cold, you have a greater risk of damage to the area. If directed, apply heat to muscles that are tense or tight. Do this before you exercise, or as often as told by your health care provider. Use the heat source that your health care provider recommends, such as a moist heat pack or a heating pad. To do this: Place a towel between your skin and the heat source. Leave the heat on for 20-30 minutes. Remove the heat if your skin turns bright red. This is especially important if you are unable to feel pain, heat, or cold. You may have a greater risk of getting burned. Try taking hot showers or baths to help relax tight muscles. General instructions If you are having frequent leg cramps, avoid intense exercise for several days. Take over-the-counter and prescription medicines only as told by your health care provider. Keep all follow-up visits. This is important. Contact a health care provider if: Your leg cramps get more severe or more frequent, or they do not improve over time. Your foot becomes cold, numb, or blue. Summary Muscle cramps  can develop in any muscle, but the most common place is in the calf muscles of the leg. Leg cramps are painful, and they may last for a few seconds to a few minutes. Usually, leg cramps are not caused by a serious medical problem. Often, the cause is not known. Stay hydrated, and take over-the-counter and prescription medicines only as told by your health care provider. This information is not intended to replace advice given to you by your health  care provider. Make sure you discuss any questions you have with your health care provider. Document Revised: 06/30/2019 Document Reviewed: 06/30/2019 Elsevier Patient Education  2024 [employer].

## 2023-01-17 LAB — COMPREHENSIVE METABOLIC PANEL
ALT: 12 U/L (ref 0–35)
AST: 15 U/L (ref 0–37)
Albumin: 4.2 g/dL (ref 3.5–5.2)
Alkaline Phosphatase: 76 U/L (ref 39–117)
BUN: 28 mg/dL — ABNORMAL HIGH (ref 6–23)
CO2: 30 meq/L (ref 19–32)
Calcium: 9.6 mg/dL (ref 8.4–10.5)
Chloride: 98 meq/L (ref 96–112)
Creatinine, Ser: 0.97 mg/dL (ref 0.40–1.20)
GFR: 52.79 mL/min — ABNORMAL LOW (ref >60.00)
Glucose, Bld: 82 mg/dL (ref 70–99)
Potassium: 4.1 meq/L (ref 3.5–5.1)
Sodium: 136 meq/L (ref 135–145)
Total Bilirubin: 0.6 mg/dL (ref 0.2–1.2)
Total Protein: 7 g/dL (ref 6.0–8.3)

## 2023-01-17 LAB — LIPID PANEL
Cholesterol: 226 mg/dL — ABNORMAL HIGH (ref 0–200)
HDL: 58.2 mg/dL (ref >39.00)
LDL Cholesterol: 128 mg/dL — ABNORMAL HIGH (ref 0–99)
NonHDL: 167.51
Total CHOL/HDL Ratio: 4
Triglycerides: 199 mg/dL — ABNORMAL HIGH (ref 0.0–149.0)
VLDL: 39.8 mg/dL (ref 0.0–40.0)

## 2023-01-17 LAB — MAGNESIUM: Magnesium: 1.8 mg/dL (ref 1.5–2.5)

## 2023-01-17 LAB — CK: Total CK: 123 U/L (ref 7–177)

## 2023-02-17 ENCOUNTER — Ambulatory Visit: Payer: Medicare Other | Admitting: Orthopedic Surgery

## 2023-02-27 ENCOUNTER — Ambulatory Visit: Payer: Medicare Other | Attending: Family Medicine | Admitting: Audiologist

## 2023-03-05 ENCOUNTER — Ambulatory Visit: Payer: Medicare Other | Admitting: Family Medicine

## 2023-03-12 ENCOUNTER — Ambulatory Visit: Payer: Medicare Other | Admitting: Orthopedic Surgery

## 2023-03-28 ENCOUNTER — Encounter: Payer: Self-pay | Admitting: Orthopedic Surgery

## 2023-03-28 ENCOUNTER — Ambulatory Visit: Payer: Medicare Other | Admitting: Orthopedic Surgery

## 2023-03-28 ENCOUNTER — Other Ambulatory Visit (INDEPENDENT_AMBULATORY_CARE_PROVIDER_SITE_OTHER): Payer: Medicare Other

## 2023-03-28 DIAGNOSIS — M25511 Pain in right shoulder: Secondary | ICD-10-CM

## 2023-03-28 NOTE — Progress Notes (Unsigned)
Office Visit Note   Patient: Molly Lambert           Date of Birth: 1935-01-02           MRN: [id number] Visit Date: 03/28/2023 Requested by: Shade Flood, MD [address] [place],  [place] [address] PCP: Shade Flood, MD  Subjective: Chief Complaint  Patient presents with   Shoulder Pain    HPI: Molly Lambert is a 89 y.o. female who presents to the office reporting right shoulder pain of several years duration.  Denies any history of injury.  She is right-Jemmott dominant.  Pain wakes her from sleep at night.  Radiates to the elbow.  She does report some numbness and tingling in both hands as well including neck pain.  She states she has very limited range of motion with the right shoulder.  Difficulty with ADLs.  She states that her shoulder becomes "so sore sometimes".  Does take occasional ibuprofen and Tylenol.  Did have total hip replacement 7 years ago..                ROS: All systems reviewed are negative as they relate to the chief complaint within the history of present illness.  Patient denies fevers or chills.  Assessment & Plan: Visit Diagnoses:  1. Right shoulder pain, unspecified chronicity     Plan: Impression is severe end-stage right shoulder arthritis with significant limitation of motion.  Discussed with Jacqualyn the risk and benefits of shoulder replacement.  She would be at higher risk of complication just from the stress of surgery itself due to her age but in general she does not have too much in terms of medical comorbidities.  At this time we also talked about trying a cortisone injection into the glenohumeral joint for pain relief.  She wants to hold off on that intervention.  After fairly lengthy discussion she is going to live with what she has but if her symptoms become worse I think at least she should consider getting a cortisone injection into the shoulder just to see how long it would last.  Follow-up as needed.  Follow-Up Instructions: No  follow-ups on file.   Orders:  Orders Placed This Encounter  Procedures   XR Shoulder Right   No orders of the defined types were placed in this encounter.     Procedures: No procedures performed   Clinical Data: No additional findings.  Objective: Vital Signs: There were no vitals taken for this visit.  Physical Exam:  Constitutional: Patient appears well-developed HEENT:  Head: Normocephalic Eyes:EOM are normal Neck: Normal range of motion Cardiovascular: Normal rate Pulmonary/chest: Effort normal Neurologic: Patient is alert Skin: Skin is warm Psychiatric: Patient has normal mood and affect  Ortho Exam: Ortho exam demonstrates range of motion on the right of 10/60/80.  Range of motion on the left is 60/100/160.  She has fairly reasonable rotator cuff strength with internal and external rotation on the right.  Does have some coarseness consistent with bone-on-bone arthritis.  Deltoid is functional.  Motor and sensory function to the hands intact with palpable radial pulses bilaterally.  No masses lymphadenopathy or skin changes noted in the right shoulder region.  Specialty Comments:  No specialty comments available.  Imaging: No results found.   PMFS History: Patient Active Problem List   Diagnosis Date Noted   OA (osteoarthritis) of hip 07/17/2016   Breast microcalcifications 12/27/2011   History of breast cancer 12/27/2011  Hypothyroidism 08/25/2011   Hypercholesteremia 08/25/2011   HTN (hypertension) 08/25/2011   Malignant neoplasm of central portion of right breast in female, estrogen receptor positive (HCC) 02/27/2011   Breast mass, right 11/28/2010   Past Medical History:  Diagnosis Date   Arthritis    Breast cancer (HCC) 11/2010   Rt breast   Cataract    Erythema    reddened area of epidermis spnning concentrated around sinuses on the face; pt reports cracking, itchiness, and flaking  of affected skin; seen derm no absolute dx, will get 2nd  opinion    Hypercholesteremia    Hypertension    NO CARDIAC MD,  [facility]  PCP  ,  URGENT [facility]   Hypothyroidism    Leg laceration    WITH SURGERY   Paget's carcinoma of the nipple, right (HCC)    nipple removed   Personal history of radiation therapy    S/P radiation therapy 03/14/11 - 04/05/11   Right Breast / 4256 cGy in 16 Fractions   Thyroid disease     Family History  Problem Relation Age of Onset   Kidney disease Mother        kidney failure    Kidney failure Mother    Stroke Mother    Heart disease Father        heart attack    Heart attack Father    Lymphoma Sister    Pancreatitis Sister     Past Surgical History:  Procedure Laterality Date   BREAST BIOPSY     BREAST LUMPECTOMY  01/22/11   RIGHT BREAST LUMPECTOMY WITH BIOSPY OF 1 SENTINEL NODE, INVASIVE GRADE II DUCTAL CARCINOMA , INTERMEDIATE GRADE DUCTAL CARCINOMA IN SITU , DERMAL SKIN INVOLVED, MARGINS NEGATIVE,( 0/1)  NODE POSITIVE, ER+, PR+, LOW s-PHASE, HER 2 NEU- NO AMPLIFICATION   BREAST SURGERY  11/28/10   SKIN-PUNCH BIOSPY RIGHT BREAST(NIPPLE), ER+, RP+, LOW S-PHASE, HER 2NEU NEGAITIVE   EYE SURGERY      BILATERAL CATARACT SURGERY   JOINT REPLACEMENT     left hip   PARTIAL MASTECTOMY WITH NEEDLE LOCALIZATION  01/10/2012   Procedure: PARTIAL MASTECTOMY WITH NEEDLE LOCALIZATION;  Surgeon: Maisie Fus A. Cornett, MD;  Location: [facility];  Service: General;  Laterality: Right;  right breast needle localized partial mastectomy and removal of nipple    TOTAL HIP ARTHROPLASTY Left 07/17/2016   Procedure: LEFT TOTAL HIP ARTHROPLASTY ANTERIOR APPROACH;  Surgeon: Ollen Gross, MD;  Location: [facility];  Service: Orthopedics;  Laterality: Left;   TOTAL HIP ARTHROPLASTY Right 11/20/2016   Procedure: RIGHT TOTAL HIP ARTHROPLASTY ANTERIOR APPROACH;  Surgeon: Ollen Gross, MD;  Location: [facility];  Service: Orthopedics;  Laterality: Right;   TUBAL LIGATION  1972   Social History   Occupational History    Occupation: Waitress    Comment: Curator  Tobacco Use   Smoking status: Never   Smokeless tobacco: Never  Substance and Sexual Activity   Alcohol use: No    Alcohol/week: 0.0 standard drinks of alcohol    Comment: Rarely/On Special Occasions   Drug use: No   Sexual activity: Not Currently    Comment: G3, P3, MENARCHE, AGE 43, MENOPAUSE AGE 25, NO BC AND HRT X 10 YEARS

## 2023-03-31 ENCOUNTER — Ambulatory Visit: Payer: Medicare Other | Admitting: Audiologist

## 2023-04-01 ENCOUNTER — Other Ambulatory Visit: Payer: Medicare Other

## 2023-04-24 ENCOUNTER — Ambulatory Visit: Payer: Medicare Other | Admitting: Family Medicine

## 2023-05-15 ENCOUNTER — Ambulatory Visit: Payer: Medicare Other | Attending: Family Medicine | Admitting: Audiologist

## 2023-05-15 DIAGNOSIS — H903 Sensorineural hearing loss, bilateral: Secondary | ICD-10-CM | POA: Insufficient documentation

## 2023-05-15 NOTE — Procedures (Signed)
  Outpatient Audiology and [facility] [address] [place], [place]  [address] [phone]  AUDIOLOGICAL  EVALUATION  NAME: [name]     DOB:   1934/10/08      MRN: [id number]                                                                                     DATE: 05/15/2023     REFERENT: Shade Flood, MD STATUS: Outpatient DIAGNOSIS: Presbycusis. Sloping Sensorineural Hearing Loss   History: [name] was seen for an audiological evaluation due to her daughter's concern for her hearing. She cannot hear certain TV shows well. In the car she struggles to hear her daughter. She cannot tell what someone says if they have on a mask.  [name] denies pain, pressure, or tinnitus. She has very occasional buzzing but its not bothersome.  [name] has no history of hazardous noise exposure.  Medical history shows no additional risk for hearing loss.    Evaluation:  Otoscopy showed a clear view of the tympanic membranes, bilaterally Tympanometry results were consistent with normal middle ear function, bilaterally   Audiometric testing was completed using Conventional Audiometry techniques with insert earphones and supraural headphones. Test results are consistent with mild sloping to moderately severe sensorineural hearing loss bilaterally. Speech Recognition Thresholds were obtained at  40dB HL in the right ear and at 40dB HL in the left ear. Word Recognition Testing was completed at  40dB SL and [name] scored 92% in each ear.   Results:  The test results were reviewed with [name]. [name] has a sloping mild to moderately severe sensorineural hearing loss bilaterally. This is due to age related changes. She cannot hear high pitched sounds, making speech sound muffled and unclear. She is lipreading more than she realizes, hence cannot hear if people have on mask. She needs hearing aids.  Audiogram printed and provided to [facility].   Recommendations: Hearing aids recommended for  both ears. Patient given list of local hearing aid providers and guidance on how to use [employer] [name] has handout with phone number and several copies of the audiogram. If she had questions, she was instructed to have daughter call me.  Annual audiometric testing recommended to monitor hearing loss for progression.   29 minutes spent testing and counseling on results.   If you have any questions please feel free to contact me at [phone].  Ammie Ferrier Stalnaker Au.D.  Audiologist   05/15/2023  10:20 AM  Cc: Shade Flood, MD

## 2023-05-23 ENCOUNTER — Ambulatory Visit: Payer: Medicare Other | Admitting: Orthopedic Surgery

## 2023-05-28 ENCOUNTER — Other Ambulatory Visit: Payer: Self-pay

## 2023-05-28 ENCOUNTER — Ambulatory Visit (INDEPENDENT_AMBULATORY_CARE_PROVIDER_SITE_OTHER): Admitting: Orthopedic Surgery

## 2023-05-28 ENCOUNTER — Encounter: Payer: Self-pay | Admitting: Orthopedic Surgery

## 2023-05-28 DIAGNOSIS — M19011 Primary osteoarthritis, right shoulder: Secondary | ICD-10-CM

## 2023-05-28 DIAGNOSIS — M25511 Pain in right shoulder: Secondary | ICD-10-CM

## 2023-05-28 NOTE — Progress Notes (Signed)
 Office Visit Note   Patient: Molly Lambert           Date of Birth: 1934-12-20           MRN: [id number] Visit Date: 05/28/2023 Requested by: Shade Flood, MD [address] [place],  [place] [address] PCP: Shade Flood, MD  Subjective: Chief Complaint  Patient presents with   Right Shoulder - Pain    HPI: Molly Lambert is a 89 y.o. female who presents to the office reporting right shoulder pain.  Was last seen in the office 03/28/2023.  Prior injection into the right shoulder glenohumeral joint was helpful.  She does have a known history of arthritis.  Taking Tylenol for symptoms..                ROS: All systems reviewed are negative as they relate to the chief complaint within the history of present illness.  Patient denies fevers or chills.  Assessment & Plan: Visit Diagnoses:  1. Right shoulder pain, unspecified chronicity     Plan: Impression is right shoulder arthritis.  Glenohumeral joint injection performed today under ultrasound guidance.  I do not think she is really in the best place for shoulder replacement surgery based on medical factors but as long as these injections are helping we can continue to do them not more than 3 per calendar year.  She was able to use the right arm and shoulder for gardening after the last injection  Follow-Up Instructions: No follow-ups on file.   Orders:  Orders Placed This Encounter  Procedures   US Guided Needle Placement - No Linked Charges   No orders of the defined types were placed in this encounter.     Procedures: Large Joint Inj: R glenohumeral on 05/28/2023 12:50 PM Indications: diagnostic evaluation and pain Details: 22 G 3.5 in needle, ultrasound-guided posterior approach  Arthrogram: No  Medications: 9 mL bupivacaine 0.5 %; 40 mg methylPREDNISolone acetate 40 MG/ML; 5 mL lidocaine 1 % Outcome: tolerated well, no immediate complications Procedure, treatment alternatives, risks and benefits explained,  specific risks discussed. Consent was given by the patient. Immediately prior to procedure a time out was called to verify the correct patient, procedure, equipment, support staff and site/side marked as required. Patient was prepped and draped in the usual sterile fashion.       Clinical Data: No additional findings.  Objective: Vital Signs: There were no vitals taken for this visit.  Physical Exam:  Constitutional: Patient appears well-developed HEENT:  Head: Normocephalic Eyes:EOM are normal Neck: Normal range of motion Cardiovascular: Normal rate Pulmonary/chest: Effort normal Neurologic: Patient is alert Skin: Skin is warm Psychiatric: Patient has normal mood and affect  Ortho Exam: Ortho exam demonstrates good cervical spine range of motion with good motor or sensory strength in the arms and hands.  She does have some coarseness with passive range of motion of that right shoulder consistent with her diagnosis.  Rotator cuff strength is reasonable and she does have forward flexion abduction above 90 degrees.  The rest of her exam is unchanged from prior visits in terms of the shoulder.  Specialty Comments:  No specialty comments available.  Imaging: US Guided Needle Placement - No Linked Charges Result Date: 05/28/2023 Ultrasound imaging demonstrates needle placement into the glenohumeral joint with injection of fluid into the joint and no complicating features. Right shoulder.    PMFS History: Patient Active Problem List   Diagnosis Date Noted  OA (osteoarthritis) of hip 07/17/2016   Breast microcalcifications 12/27/2011   History of breast cancer 12/27/2011   Hypothyroidism 08/25/2011   Hypercholesteremia 08/25/2011   HTN (hypertension) 08/25/2011   Malignant neoplasm of central portion of right breast in female, estrogen receptor positive (HCC) 02/27/2011   Breast mass, right 11/28/2010   Past Medical History:  Diagnosis Date   Arthritis    Breast cancer  (HCC) 11/2010   Rt breast   Cataract    Erythema    reddened area of epidermis spnning concentrated around sinuses on the face; pt reports cracking, itchiness, and flaking  of affected skin; seen derm no absolute dx, will get 2nd opinion    Hypercholesteremia    Hypertension    NO CARDIAC MD,  [facility]  PCP  ,  URGENT MED   Hypothyroidism    Leg laceration    WITH SURGERY   Paget's carcinoma of the nipple, right (HCC)    nipple removed   Personal history of radiation therapy    S/P radiation therapy 03/14/11 - 04/05/11   Right Breast / 4256 cGy in 16 Fractions   Thyroid disease     Family History  Problem Relation Age of Onset   Kidney disease Mother        kidney failure    Kidney failure Mother    Stroke Mother    Heart disease Father        heart attack    Heart attack Father    Lymphoma Sister    Pancreatitis Sister     Past Surgical History:  Procedure Laterality Date   BREAST BIOPSY     BREAST LUMPECTOMY  01/22/11   RIGHT BREAST LUMPECTOMY WITH BIOSPY OF 1 SENTINEL NODE, INVASIVE GRADE II DUCTAL CARCINOMA , INTERMEDIATE GRADE DUCTAL CARCINOMA IN SITU , DERMAL SKIN INVOLVED, MARGINS NEGATIVE,( 0/1)  NODE POSITIVE, ER+, PR+, LOW s-PHASE, HER 2 NEU- NO AMPLIFICATION   BREAST SURGERY  11/28/10   SKIN-PUNCH BIOSPY RIGHT BREAST(NIPPLE), ER+, RP+, LOW S-PHASE, HER 2NEU NEGAITIVE   EYE SURGERY      BILATERAL CATARACT SURGERY   JOINT REPLACEMENT     left hip   PARTIAL MASTECTOMY WITH NEEDLE LOCALIZATION  01/10/2012   Procedure: PARTIAL MASTECTOMY WITH NEEDLE LOCALIZATION;  Surgeon: Maisie Fus A. Cornett, MD;  Location: [facility];  Service: General;  Laterality: Right;  right breast needle localized partial mastectomy and removal of nipple    TOTAL HIP ARTHROPLASTY Left 07/17/2016   Procedure: LEFT TOTAL HIP ARTHROPLASTY ANTERIOR APPROACH;  Surgeon: Ollen Gross, MD;  Location: [facility];  Service: Orthopedics;  Laterality: Left;   TOTAL HIP ARTHROPLASTY Right  11/20/2016   Procedure: RIGHT TOTAL HIP ARTHROPLASTY ANTERIOR APPROACH;  Surgeon: Ollen Gross, MD;  Location: [facility];  Service: Orthopedics;  Laterality: Right;   TUBAL LIGATION  1972   Social History   Occupational History   Occupation: Waitress    Comment: Curator  Tobacco Use   Smoking status: Never   Smokeless tobacco: Never  Substance and Sexual Activity   Alcohol use: No    Alcohol/week: 0.0 standard drinks of alcohol    Comment: Rarely/On Special Occasions   Drug use: No   Sexual activity: Not Currently    Comment: G3, P3, MENARCHE, AGE 85, MENOPAUSE AGE 32, NO BC AND HRT X 10 YEARS

## 2023-05-29 ENCOUNTER — Ambulatory Visit: Payer: Medicare Other | Admitting: Family Medicine

## 2023-05-31 MED ORDER — BUPIVACAINE HCL 0.5 % IJ SOLN
9.0000 mL | INTRAMUSCULAR | Status: AC | PRN
  Administered 2023-05-28: 9 mL via INTRA_ARTICULAR

## 2023-05-31 MED ORDER — LIDOCAINE HCL 1 % IJ SOLN
5.0000 mL | INTRAMUSCULAR | Status: AC | PRN
  Administered 2023-05-28: 5 mL

## 2023-05-31 MED ORDER — METHYLPREDNISOLONE ACETATE 40 MG/ML IJ SUSP
40.0000 mg | INTRAMUSCULAR | Status: AC | PRN
  Administered 2023-05-28: 40 mg via INTRA_ARTICULAR

## 2023-06-08 ENCOUNTER — Other Ambulatory Visit: Payer: Self-pay | Admitting: Family Medicine

## 2023-06-08 DIAGNOSIS — I10 Essential (primary) hypertension: Secondary | ICD-10-CM

## 2023-06-26 ENCOUNTER — Ambulatory Visit: Admitting: Family Medicine

## 2023-07-01 ENCOUNTER — Ambulatory Visit (INDEPENDENT_AMBULATORY_CARE_PROVIDER_SITE_OTHER): Admitting: *Deleted

## 2023-07-01 DIAGNOSIS — Z0000 Encounter for general adult medical examination without abnormal findings: Secondary | ICD-10-CM

## 2023-07-01 NOTE — Patient Instructions (Signed)
 [name] , Thank you for taking time to come for your Medicare Wellness Visit. I appreciate your ongoing commitment to your health goals. Please review the following plan we discussed and let me know if I can assist you in the future.   Screening recommendations/referrals: Colonoscopy: no longer required Mammogram: up to date Bone Density: Education provided Recommended yearly ophthalmology/optometry visit for glaucoma screening and checkup Recommended yearly dental visit for hygiene and checkup  Vaccinations: Influenza vaccine: up to date Pneumococcal vaccine: up to date Tdap vaccine: Education provided Shingles vaccine: up to date       Preventive Care 65 Years and Older, Female Preventive care refers to lifestyle choices and visits with your health care provider that can promote health and wellness. What does preventive care include? A yearly physical exam. This is also called an annual well check. Dental exams once or twice a year. Routine eye exams. Ask your health care provider how often you should have your eyes checked. Personal lifestyle choices, including: Daily care of your teeth and gums. Regular physical activity. Eating a healthy diet. Avoiding tobacco and drug use. Limiting alcohol use. Practicing safe sex. Taking low-dose aspirin every day. Taking vitamin and mineral supplements as recommended by your health care provider. What happens during an annual well check? The services and screenings done by your health care provider during your annual well check will depend on your age, overall health, lifestyle risk factors, and family history of disease. Counseling  Your health care provider may ask you questions about your: Alcohol use. Tobacco use. Drug use. Emotional well-being. Home and relationship well-being. Sexual activity. Eating habits. History of falls. Memory and ability to understand (cognition). Work and work Astronomer. Reproductive  health. Screening  You may have the following tests or measurements: Height, weight, and BMI. Blood pressure. Lipid and cholesterol levels. These may be checked every 5 years, or more frequently if you are over 15 years old. Skin check. Lung cancer screening. You may have this screening every year starting at age 49 if you have a 30-pack-year history of smoking and currently smoke or have quit within the past 15 years. Fecal occult blood test (FOBT) of the stool. You may have this test every year starting at age 30. Flexible sigmoidoscopy or colonoscopy. You may have a sigmoidoscopy every 5 years or a colonoscopy every 10 years starting at age 64. Hepatitis C blood test. Hepatitis B blood test. Sexually transmitted disease (STD) testing. Diabetes screening. This is done by checking your blood sugar (glucose) after you have not eaten for a while (fasting). You may have this done every 1-3 years. Bone density scan. This is done to screen for osteoporosis. You may have this done starting at age 20. Mammogram. This may be done every 1-2 years. Talk to your health care provider about how often you should have regular mammograms. Talk with your health care provider about your test results, treatment options, and if necessary, the need for more tests. Vaccines  Your health care provider may recommend certain vaccines, such as: Influenza vaccine. This is recommended every year. Tetanus, diphtheria, and acellular pertussis (Tdap, Td) vaccine. You may need a Td booster every 10 years. Zoster vaccine. You may need this after age 74. Pneumococcal 13-valent conjugate (PCV13) vaccine. One dose is recommended after age 20. Pneumococcal polysaccharide (PPSV23) vaccine. One dose is recommended after age 85. Talk to your health care provider about which screenings and vaccines you need and how often you need them. This information  is not intended to replace advice given to you by your health care provider.  Make sure you discuss any questions you have with your health care provider. Document Released: 03/10/2015 Document Revised: 11/01/2015 Document Reviewed: 12/13/2014 Elsevier Interactive Patient Education  2017 [employer].  Fall Prevention in the Home Falls can cause injuries. They can happen to people of all ages. There are many things you can do to make your home safe and to help prevent falls. What can I do on the outside of my home? Regularly fix the edges of walkways and driveways and fix any cracks. Remove anything that might make you trip as you walk through a door, such as a raised step or threshold. Trim any bushes or trees on the path to your home. Use bright outdoor lighting. Clear any walking paths of anything that might make someone trip, such as rocks or tools. Regularly check to see if handrails are loose or broken. Make sure that both sides of any steps have handrails. Any raised decks and porches should have guardrails on the edges. Have any leaves, snow, or ice cleared regularly. Use sand or salt on walking paths during winter. Clean up any spills in your garage right away. This includes oil or grease spills. What can I do in the bathroom? Use night lights. Install grab bars by the toilet and in the tub and shower. Do not use towel bars as grab bars. Use non-skid mats or decals in the tub or shower. If you need to sit down in the shower, use a plastic, non-slip stool. Keep the floor dry. Clean up any water  that spills on the floor as soon as it happens. Remove soap buildup in the tub or shower regularly. Attach bath mats securely with double-sided non-slip rug tape. Do not have throw rugs and other things on the floor that can make you trip. What can I do in the bedroom? Use night lights. Make sure that you have a light by your bed that is easy to reach. Do not use any sheets or blankets that are too big for your bed. They should not hang down onto the floor. Have a  firm chair that has side arms. You can use this for support while you get dressed. Do not have throw rugs and other things on the floor that can make you trip. What can I do in the kitchen? Clean up any spills right away. Avoid walking on wet floors. Keep items that you use a lot in easy-to-reach places. If you need to reach something above you, use a strong step stool that has a grab bar. Keep electrical cords out of the way. Do not use floor polish or wax that makes floors slippery. If you must use wax, use non-skid floor wax. Do not have throw rugs and other things on the floor that can make you trip. What can I do with my stairs? Do not leave any items on the stairs. Make sure that there are handrails on both sides of the stairs and use them. Fix handrails that are broken or loose. Make sure that handrails are as long as the stairways. Check any carpeting to make sure that it is firmly attached to the stairs. Fix any carpet that is loose or worn. Avoid having throw rugs at the top or bottom of the stairs. If you do have throw rugs, attach them to the floor with carpet tape. Make sure that you have a light switch at the top of  the stairs and the bottom of the stairs. If you do not have them, ask someone to add them for you. What else can I do to help prevent falls? Wear shoes that: Do not have high heels. Have rubber bottoms. Are comfortable and fit you well. Are closed at the toe. Do not wear sandals. If you use a stepladder: Make sure that it is fully opened. Do not climb a closed stepladder. Make sure that both sides of the stepladder are locked into place. Ask someone to hold it for you, if possible. Clearly mark and make sure that you can see: Any grab bars or handrails. First and last steps. Where the edge of each step is. Use tools that help you move around (mobility aids) if they are needed. These include: Canes. Walkers. Scooters. Crutches. Turn on the lights when you  go into a dark area. Replace any light bulbs as soon as they burn out. Set up your furniture so you have a clear path. Avoid moving your furniture around. If any of your floors are uneven, fix them. If there are any pets around you, be aware of where they are. Review your medicines with your doctor. Some medicines can make you feel dizzy. This can increase your chance of falling. Ask your doctor what other things that you can do to help prevent falls. This information is not intended to replace advice given to you by your health care provider. Make sure you discuss any questions you have with your health care provider. Document Released: 12/08/2008 Document Revised: 07/20/2015 Document Reviewed: 03/18/2014 Elsevier Interactive Patient Education  2017 [employer].

## 2023-07-01 NOTE — Progress Notes (Signed)
 Subjective:   Molly Lambert is a 89 y.o. female who presents for Medicare Annual (Subsequent) preventive examination.  Visit Complete: Virtual I connected with  Molly Lambert on 07/01/23 by a audio enabled telemedicine application and verified that I am speaking with the correct person using two identifiers.  Patient Location: Home  Provider Location: Home Office  I discussed the limitations of evaluation and management by telemedicine. The patient expressed understanding and agreed to proceed.  Vital Signs: Because this visit was a virtual/telehealth visit, some criteria may be missing or patient reported. Any vitals not documented were not able to be obtained and vitals that have been documented are patient reported.   Cardiac Risk Factors include: advanced age (>59men, >86 women);family history of premature cardiovascular disease;hypertension     Objective:    There were no vitals filed for this visit. There is no height or weight on file to calculate BMI.     07/01/2023   11:01 AM 06/19/2022   10:39 AM 07/20/2019   10:36 AM 11/20/2016    5:30 PM 11/11/2016   11:01 AM 07/17/2016    7:31 PM 07/17/2016   10:29 AM  Advanced Directives  Does Patient Have a Medical Advance Directive? Yes Yes No Yes Yes Yes Yes  Type of Advance Directive Living will Living will  Healthcare Power of [place];Living will Healthcare Power of [place];Living will Living will Living will  Does patient want to make changes to medical advance directive?    No - Patient declined No - Patient declined No - Patient declined   Copy of Healthcare Power of Attorney in Chart?    No - copy requested No - copy requested    Would patient like information on creating a medical advance directive?  No - Patient declined No - Patient declined        Current Medications (verified) Outpatient Encounter Medications as of 07/01/2023  Medication Sig   amLODipine  (NORVASC ) 10 MG tablet TAKE 1 TABLET(10 MG) BY MOUTH DAILY    chlorthalidone  (HYGROTON ) 25 MG tablet TAKE 1 TABLET(25 MG) BY MOUTH DAILY   fluticasone  (FLONASE ) 50 MCG/ACT nasal spray Place 1 spray into both nostrils daily.   levothyroxine  (SYNTHROID ) 50 MCG tablet TAKE 1 TABLET(50 MCG) BY MOUTH DAILY BEFORE BREAKFAST   lisinopril  (ZESTRIL ) 5 MG tablet TAKE 1/2 TABLET BY MOUTH EVERY DAY   simvastatin  (ZOCOR ) 20 MG tablet TAKE 1 TO 2 TABLETS(20 TO 40 MG) BY MOUTH DAILY AT 6 PM   simvastatin  (ZOCOR ) 20 MG tablet TAKE 1 TO 2 TABLETS(20 TO 40 MG) BY MOUTH DAILY AT 6 PM   No facility-administered encounter medications on file as of 07/01/2023.    Allergies (verified) Xarelto  [rivaroxaban ]   History: Past Medical History:  Diagnosis Date   Arthritis    Breast cancer (HCC) 11/2010   Rt breast   Cataract    Erythema    reddened area of epidermis spnning concentrated around sinuses on the face; pt reports cracking, itchiness, and flaking  of affected skin; seen derm no absolute dx, will get 2nd opinion    Hypercholesteremia    Hypertension    NO CARDIAC MD,  [facility]  PCP  ,  URGENT MED   Hypothyroidism    Leg laceration    WITH SURGERY   Paget's carcinoma of the nipple, right (HCC)    nipple removed   Personal history of radiation therapy    S/P radiation therapy 03/14/11 - 04/05/11   Right Breast / 4256  cGy in 16 Fractions   Thyroid  disease    Past Surgical History:  Procedure Laterality Date   BREAST BIOPSY     BREAST LUMPECTOMY  01/22/11   RIGHT BREAST LUMPECTOMY WITH BIOSPY OF 1 SENTINEL NODE, INVASIVE GRADE II DUCTAL CARCINOMA , INTERMEDIATE GRADE DUCTAL CARCINOMA IN SITU , DERMAL SKIN INVOLVED, MARGINS NEGATIVE,( 0/1)  NODE POSITIVE, ER+, PR+, LOW s-PHASE, HER 2 NEU- NO AMPLIFICATION   BREAST SURGERY  11/28/10   SKIN-PUNCH BIOSPY RIGHT BREAST(NIPPLE), ER+, RP+, LOW S-PHASE, HER 2NEU NEGAITIVE   EYE SURGERY      BILATERAL CATARACT SURGERY   JOINT REPLACEMENT     left hip   PARTIAL MASTECTOMY WITH NEEDLE LOCALIZATION  01/10/2012    Procedure: PARTIAL MASTECTOMY WITH NEEDLE LOCALIZATION;  Surgeon: Andy Bannister A. Cornett, MD;  Location: [facility];  Service: General;  Laterality: Right;  right breast needle localized partial mastectomy and removal of nipple    TOTAL HIP ARTHROPLASTY Left 07/17/2016   Procedure: LEFT TOTAL HIP ARTHROPLASTY ANTERIOR APPROACH;  Surgeon: Liliane Rei, MD;  Location: [facility];  Service: Orthopedics;  Laterality: Left;   TOTAL HIP ARTHROPLASTY Right 11/20/2016   Procedure: RIGHT TOTAL HIP ARTHROPLASTY ANTERIOR APPROACH;  Surgeon: Liliane Rei, MD;  Location: [facility];  Service: Orthopedics;  Laterality: Right;   TUBAL LIGATION  1972   Family History  Problem Relation Age of Onset   Kidney disease Mother        kidney failure    Kidney failure Mother    Stroke Mother    Heart disease Father        heart attack    Heart attack Father    Lymphoma Sister    Pancreatitis Sister    Social History   Socioeconomic History   Marital status: Widowed    Spouse name: Not on file   Number of children: 3   Years of education: 49   Highest education level: Not on file  Occupational History   Occupation: Waitress    Comment: [employer]  Tobacco Use   Smoking status: Never   Smokeless tobacco: Never  Substance and Sexual Activity   Alcohol use: No    Alcohol/week: 0.0 standard drinks of alcohol    Comment: Rarely/On Special Occasions   Drug use: No   Sexual activity: Not Currently    Comment: G3, P3, MENARCHE, AGE 53, MENOPAUSE AGE 69, NO BC AND HRT X 10 YEARS  Other Topics Concern   Not on file  Social History Narrative   Married for 56 years      3 children, one deceased   6 grand-children, 3 great-grandchildren   Social Drivers of Corporate investment banker Strain: Low Risk  (07/01/2023)   Overall Financial Resource Strain (CARDIA)    Difficulty of Paying Living Expenses: Not hard at all  Food Insecurity: No Food Insecurity (07/01/2023)   Hunger Vital Sign     Worried About Running Out of Food in the Last Year: Never true    Ran Out of Food in the Last Year: Never true  Transportation Needs: No Transportation Needs (07/01/2023)   PRAPARE - Administrator, Civil Service (Medical): No    Lack of Transportation (Non-Medical): No  Physical Activity: Inactive (07/01/2023)   Exercise Vital Sign    Days of Exercise per Week: 0 days    Minutes of Exercise per Session: 0 min  Stress: No Stress Concern Present (07/01/2023)   [employer] of  Occupational Health - Occupational Stress Questionnaire    Feeling of Stress : Not at all  Social Connections: Socially Isolated (07/01/2023)   Social Connection and Isolation Panel [NHANES]    Frequency of Communication with Friends and Family: Three times a week    Frequency of Social Gatherings with Friends and Family: Never    Attends Religious Services: Never    Database administrator or Organizations: No    Attends Banker Meetings: Never    Marital Status: Widowed    Tobacco Counseling Counseling given: Not Answered   Clinical Intake:  Pre-visit preparation completed: Yes  Pain : 0-10 Pain Type: Chronic pain Pain Location: Shoulder Pain Orientation: Right Pain Descriptors / Indicators: Burning, Dull, Aching Pain Frequency: Intermittent Pain Relieving Factors: had cortisone shot  Pain Relieving Factors: had cortisone shot  Diabetes: No  How often do you need to have someone help you when you read instructions, pamphlets, or other written materials from your doctor or pharmacy?: 1 - Never  Interpreter Needed?: No  Information entered by :: Kieth Pelt LPN   Activities of Daily Living    07/01/2023   11:04 AM  In your present state of health, do you have any difficulty performing the following activities:  Hearing? 0  Vision? 0  Difficulty concentrating or making decisions? 0  Walking or climbing stairs? 0  Dressing or bathing? 0  Doing errands, shopping? 0   Preparing Food and eating ? N  Using the Toilet? N  In the past six months, have you accidently leaked urine? Y  Do you have problems with loss of bowel control? N  Managing your Medications? N  Managing your Finances? N  Housekeeping or managing your Housekeeping? N    Patient Care Team: Benjiman Bras, MD as PCP - General (Family Medicine) Magrinat, Rozella Cornfield, MD (Inactive) as Consulting Physician (Oncology)  Indicate any recent Medical Services you may have received from other than [facility] providers in the past year (date may be approximate).     Assessment:   This is a routine wellness examination for Aki.  Hearing/Vision screen Hearing Screening - Comments:: No trouble hearing Has had checked does not wear hearing aids Vision Screening - Comments:: Not up to date Is going to scheduled  Has cataract surgery in past no trouble seeing   Goals Addressed             This Visit's Progress    Patient Stated   Not on track    Continue current lifestyle     Patient Stated       Continue current lifestyle       Depression Screen    07/01/2023   11:10 AM 01/16/2023   11:03 AM 09/26/2022   10:48 AM 06/19/2022   10:50 AM 10/03/2021   10:15 AM 04/05/2021    1:01 PM 10/02/2020    2:25 PM  PHQ 2/9 Scores  PHQ - 2 Score 0 0 0 0 0 0 0  PHQ- 9 Score 4 0 0 2  0     Fall Risk    07/01/2023   11:03 AM 01/16/2023   11:02 AM 09/26/2022   10:48 AM 06/19/2022   10:39 AM 10/03/2021   10:15 AM  Fall Risk   Falls in the past year? 0 0 0 0 0  Number falls in past yr: 0 0 0 0 0  Injury with Fall? 0 0 0 0 0  Risk for fall due to :  Impaired balance/gait No Fall Risks No Fall Risks  No Fall Risks  Follow up Falls evaluation completed;Education provided;Falls prevention discussed Falls evaluation completed Falls evaluation completed Falls evaluation completed;Education provided;Falls prevention discussed Falls evaluation completed    MEDICARE RISK AT HOME: Medicare Risk at Home Home  free of loose throw rugs in walkways, pet beds, electrical cords, etc?: Yes Adequate lighting in your home to reduce risk of falls?: Yes Life alert?: No Use of a cane, walker or w/c?: Yes Grab bars in the bathroom?: Yes Shower chair or bench in shower?: Yes Elevated toilet seat or a handicapped toilet?: Yes  TIMED UP AND GO:  Was the test performed?  No    Cognitive Function:        07/01/2023   11:06 AM 06/19/2022   10:46 AM 07/20/2019   10:39 AM  6CIT Screen  What Year? 0 points 0 points 0 points  What month? 0 points 0 points 0 points  What time? 0 points 0 points 0 points  Count back from 20 0 points 0 points 0 points  Months in reverse 0 points 0 points 0 points  Repeat phrase 0 points 0 points 0 points  Total Score 0 points 0 points 0 points    Immunizations Immunization History  Administered Date(s) Administered   Influenza-Unspecified 12/28/2022   PFIZER(Purple Top)SARS-COV-2 Vaccination 07/01/2019, 07/22/2019, 03/06/2020   Pneumococcal Conjugate-13 02/29/2016   Pneumococcal Polysaccharide-23 08/23/2010   Tdap 07/20/2009   Zoster Recombinant(Shingrix) 12/28/2022    TDAP status: Due, Education has been provided regarding the importance of this vaccine. Advised may receive this vaccine at local pharmacy or Health Dept. Aware to provide a copy of the vaccination record if obtained from local pharmacy or Health Dept. Verbalized acceptance and understanding.  Flu Vaccine status: Up to date  Pneumococcal vaccine status: Up to date  Covid-19 vaccine status: Information provided on how to obtain vaccines.   Qualifies for Shingles Vaccine? No   Zostavax completed Yes   Shingrix Completed?: Yes  Screening Tests Health Maintenance  Topic Date Due   COVID-19 Vaccine (4 - 2024-25 season) 10/27/2022   Zoster Vaccines- Shingrix (2 of 2) 02/22/2023   INFLUENZA VACCINE  09/26/2023   MAMMOGRAM  10/17/2023   Medicare Annual Wellness (AWV)  06/30/2024   Pneumonia Vaccine  47+ Years old  Completed   DEXA SCAN  Completed   HPV VACCINES  Aged Out   Meningococcal B Vaccine  Aged Out   DTaP/Tdap/Td  Discontinued    Health Maintenance  Health Maintenance Due  Topic Date Due   COVID-19 Vaccine (4 - 2024-25 season) 10/27/2022   Zoster Vaccines- Shingrix (2 of 2) 02/22/2023  Will bring documentation to upcoming appointment  Colorectal cancer screening: No longer required.   Mammogram status: Completed  . Repeat every year  Bone Density status: Ordered patient given information to call and schedule. Aaron Aas Pt provided with contact info and advised to call to schedule appt.  Lung Cancer Screening: (Low Dose CT Chest recommended if Age 28-80 years, 20 pack-year currently smoking OR have quit w/in 15years.) does not qualify.   Lung Cancer Screening Referral:   Additional Screening:  Hepatitis C Screening: does not qualify;  Vision Screening: Recommended annual ophthalmology exams for early detection of glaucoma and other disorders of the eye. Is the patient up to date with their annual eye exam?  No  Who is the provider or what is the name of the office in which the patient attends annual eye exams?  Education provided    If pt is not established with a provider, would they like to be referred to a provider to establish care? No .   Dental Screening: Recommended annual dental exams for proper oral hygiene    Community Resource Referral / Chronic Care Management: CRR required this visit?  No   CCM required this visit?  No     Plan:     I have personally reviewed and noted the following in the patient's chart:   Medical and social history Use of alcohol, tobacco or illicit drugs  Current medications and supplements including opioid prescriptions. Patient is not currently taking opioid prescriptions. Functional ability and status Nutritional status Physical activity Advanced directives List of other physicians Hospitalizations, surgeries, and ER  visits in previous 12 months Vitals Screenings to include cognitive, depression, and falls Referrals and appointments  In addition, I have reviewed and discussed with patient certain preventive protocols, quality metrics, and best practice recommendations. A written personalized care plan for preventive services as well as general preventive health recommendations were provided to patient.     Kieth Pelt, LPN   09[birth year]1912   After Visit Summary: (MyChart) Due to this being a telephonic visit, the after visit summary with patients personalized plan was offered to patient via MyChart   Nurse Notes:

## 2023-07-07 ENCOUNTER — Other Ambulatory Visit: Payer: Self-pay | Admitting: Family Medicine

## 2023-07-07 DIAGNOSIS — E7800 Pure hypercholesterolemia, unspecified: Secondary | ICD-10-CM

## 2023-07-09 ENCOUNTER — Ambulatory Visit: Admitting: Family Medicine

## 2023-07-09 VITALS — BP 128/74 | HR 59 | Temp 98.0°F | Ht 64.0 in | Wt 161.2 lb

## 2023-07-09 DIAGNOSIS — E7800 Pure hypercholesterolemia, unspecified: Secondary | ICD-10-CM | POA: Diagnosis not present

## 2023-07-09 DIAGNOSIS — E039 Hypothyroidism, unspecified: Secondary | ICD-10-CM | POA: Diagnosis not present

## 2023-07-09 DIAGNOSIS — H6123 Impacted cerumen, bilateral: Secondary | ICD-10-CM

## 2023-07-09 DIAGNOSIS — I10 Essential (primary) hypertension: Secondary | ICD-10-CM | POA: Diagnosis not present

## 2023-07-09 LAB — LIPID PANEL
Cholesterol: 210 mg/dL — ABNORMAL HIGH (ref 0–200)
HDL: 64.8 mg/dL (ref >39.00)
LDL Cholesterol: 105 mg/dL — ABNORMAL HIGH (ref 0–99)
NonHDL: 145.02
Total CHOL/HDL Ratio: 3
Triglycerides: 199 mg/dL — ABNORMAL HIGH (ref 0.0–149.0)
VLDL: 39.8 mg/dL (ref 0.0–40.0)

## 2023-07-09 LAB — COMPREHENSIVE METABOLIC PANEL WITH GFR
ALT: 15 U/L (ref 0–35)
AST: 18 U/L (ref 0–37)
Albumin: 4.2 g/dL (ref 3.5–5.2)
Alkaline Phosphatase: 78 U/L (ref 39–117)
BUN: 21 mg/dL (ref 6–23)
CO2: 27 meq/L (ref 19–32)
Calcium: 9.6 mg/dL (ref 8.4–10.5)
Chloride: 100 meq/L (ref 96–112)
Creatinine, Ser: 0.93 mg/dL (ref 0.40–1.20)
GFR: 55.34 mL/min — ABNORMAL LOW (ref >60.00)
Glucose, Bld: 96 mg/dL (ref 70–99)
Potassium: 3.8 meq/L (ref 3.5–5.1)
Sodium: 137 meq/L (ref 135–145)
Total Bilirubin: 0.5 mg/dL (ref 0.2–1.2)
Total Protein: 7 g/dL (ref 6.0–8.3)

## 2023-07-09 LAB — TSH: TSH: 3.54 u[IU]/mL (ref 0.35–5.50)

## 2023-07-09 MED ORDER — AMLODIPINE BESYLATE 10 MG PO TABS: ORAL_TABLET | ORAL | 2 refills | Status: DC

## 2023-07-09 MED ORDER — LEVOTHYROXINE SODIUM 50 MCG PO TABS: ORAL_TABLET | ORAL | 2 refills | Status: DC

## 2023-07-09 MED ORDER — SIMVASTATIN 20 MG PO TABS: 20.0000 mg | ORAL_TABLET | ORAL | 2 refills | Status: DC

## 2023-07-09 MED ORDER — CHLORTHALIDONE 25 MG PO TABS: ORAL_TABLET | ORAL | 2 refills | Status: DC

## 2023-07-09 MED ORDER — LISINOPRIL 5 MG PO TABS: ORAL_TABLET | ORAL | 2 refills | Status: DC

## 2023-07-09 NOTE — Progress Notes (Signed)
 Subjective:  Patient ID: Molly Lambert, female    DOB: [birth year]  Age: 89 y.o. MRN: [id number]  CC:  Chief Complaint  Patient presents with   Medical Management of Chronic Issues    Pt notes dealing with the muscle cramps has tried Dr recommendations and notes no real improvement     HPI [name] presents for   Hyperlipidemia: Last discussed in November.  Treated with simvastatin  20 mg daily at that time.  She has experienced some arthralgias in shoulder, elbows, hands.  We discussed the possible option of stopping statin temporarily but she had not done so, and LDL had been higher than her prior readings.  Had also noticed some cramps in her lower legs over the previous year, noted more at night.  Had tried home remedies of pickle juice, pickles, vinegar, mustard with some improvement in symptoms.  Denied any color changes or cyanotic symptoms.  CPK level was normal.  Magnesium level was normal.  Stretches and sufficient fluids were discussed.  Since last visit has followed up with orthopedics regarding her shoulder pain, Dr. Rozelle Corning.  With injection on April 2.  Tried taking the simvastatin  every other day since last visit. Still some intermittent cramps, but better.    Lab Results  Component Value Date   CHOL 226 (H) 01/16/2023   HDL 58.20 01/16/2023   LDLCALC 128 (H) 01/16/2023   LDLDIRECT 113.0 10/02/2020   TRIG 199.0 (H) 01/16/2023   CHOLHDL 4 01/16/2023   Lab Results  Component Value Date   ALT 12 01/16/2023   AST 15 01/16/2023   ALKPHOS 76 01/16/2023   BILITOT 0.6 01/16/2023   Hypothyroidism: Lab Results  Component Value Date   TSH 2.71 09/26/2022  Taking medication daily.  No new hot or cold intolerance. No new hair or skin changes, heart palpitations or new fatigue. No new weight changes. Some easy bruising at times at times on forearms. No ASA/blood thinners.  No bleeding gums, BRBPR, melena.    Hypertension: Amlodipine  10mg  every day, lisinopril  2.5mg   every day, chlorthalidone  25mg  every day.  Home readings: 130 or under/70 range. No new med side effects.  BP Readings from Last 3 Encounters:  07/09/23 128/74  01/16/23 118/68  10/03/22 122/70   Lab Results  Component Value Date   CREATININE 0.97 01/16/2023        History Patient Active Problem List   Diagnosis Date Noted   OA (osteoarthritis) of hip 07/17/2016   Breast microcalcifications 12/27/2011   History of breast cancer 12/27/2011   Hypothyroidism 08/25/2011   Hypercholesteremia 08/25/2011   HTN (hypertension) 08/25/2011   Malignant neoplasm of central portion of right breast in female, estrogen receptor positive (HCC) 02/27/2011   Breast mass, right 11/28/2010   Past Medical History:  Diagnosis Date   Arthritis    Breast cancer (HCC) 11/2010   Rt breast   Cataract    Erythema    reddened area of epidermis spnning concentrated around sinuses on the face; pt reports cracking, itchiness, and flaking  of affected skin; seen derm no absolute dx, will get 2nd opinion    Hypercholesteremia    Hypertension    NO CARDIAC MD,  [facility]  PCP  ,  URGENT MED   Hypothyroidism    Leg laceration    WITH SURGERY   Paget's carcinoma of the nipple, right (HCC)    nipple removed   Personal history of radiation therapy    S/P radiation therapy 03/14/11 - 04/05/11  Right Breast / 4256 cGy in 16 Fractions   Thyroid  disease    Past Surgical History:  Procedure Laterality Date   BREAST BIOPSY     BREAST LUMPECTOMY  01/22/11   RIGHT BREAST LUMPECTOMY WITH BIOSPY OF 1 SENTINEL NODE, INVASIVE GRADE II DUCTAL CARCINOMA , INTERMEDIATE GRADE DUCTAL CARCINOMA IN SITU , DERMAL SKIN INVOLVED, MARGINS NEGATIVE,( 0/1)  NODE POSITIVE, ER+, PR+, LOW s-PHASE, HER 2 NEU- NO AMPLIFICATION   BREAST SURGERY  11/28/10   SKIN-PUNCH BIOSPY RIGHT BREAST(NIPPLE), ER+, RP+, LOW S-PHASE, HER 2NEU NEGAITIVE   EYE SURGERY      BILATERAL CATARACT SURGERY   JOINT REPLACEMENT     left hip   PARTIAL  MASTECTOMY WITH NEEDLE LOCALIZATION  01/10/2012   Procedure: PARTIAL MASTECTOMY WITH NEEDLE LOCALIZATION;  Surgeon: Andy Bannister A. Cornett, MD;  Location: [facility];  Service: General;  Laterality: Right;  right breast needle localized partial mastectomy and removal of nipple    TOTAL HIP ARTHROPLASTY Left 07/17/2016   Procedure: LEFT TOTAL HIP ARTHROPLASTY ANTERIOR APPROACH;  Surgeon: Liliane Rei, MD;  Location: [facility];  Service: Orthopedics;  Laterality: Left;   TOTAL HIP ARTHROPLASTY Right 11/20/2016   Procedure: RIGHT TOTAL HIP ARTHROPLASTY ANTERIOR APPROACH;  Surgeon: Liliane Rei, MD;  Location: [facility];  Service: Orthopedics;  Laterality: Right;   TUBAL LIGATION  1972   Allergies  Allergen Reactions   Xarelto  [Rivaroxaban ]    Prior to Admission medications   Medication Sig Start Date End Date Taking? Authorizing Provider  amLODipine  (NORVASC ) 10 MG tablet TAKE 1 TABLET(10 MG) BY MOUTH DAILY 10/15/22  Yes Benjiman Bras, MD  chlorthalidone  (HYGROTON ) 25 MG tablet TAKE 1 TABLET(25 MG) BY MOUTH DAILY 06/10/23  Yes Benjiman Bras, MD  fluticasone  (FLONASE ) 50 MCG/ACT nasal spray Place 1 spray into both nostrils daily. 10/02/20  Yes Ulyess Gammons, NP  levothyroxine  (SYNTHROID ) 50 MCG tablet TAKE 1 TABLET(50 MCG) BY MOUTH DAILY BEFORE BREAKFAST 06/20/22  Yes Benjiman Bras, MD  lisinopril  (ZESTRIL ) 5 MG tablet TAKE 1/2 TABLET BY MOUTH EVERY DAY 12/26/22  Yes Benjiman Bras, MD  simvastatin  (ZOCOR ) 20 MG tablet TAKE 1 TO 2 TABLETS(20 TO 40 MG) BY MOUTH DAILY AT 6 PM 05/14/22  Yes Benjiman Bras, MD  simvastatin  (ZOCOR ) 20 MG tablet TAKE 1 TO 2 TABLETS(20 TO 40 MG) BY MOUTH DAILY AT 6 PM 07/08/23  Yes Benjiman Bras, MD   Social History   Socioeconomic History   Marital status: Widowed    Spouse name: Not on file   Number of children: 3   Years of education: 26   Highest education level: Not on file  Occupational History   Occupation: Waitress    Comment:  [employer]  Tobacco Use   Smoking status: Never   Smokeless tobacco: Never  Substance and Sexual Activity   Alcohol use: No    Alcohol/week: 0.0 standard drinks of alcohol    Comment: Rarely/On Special Occasions   Drug use: No   Sexual activity: Not Currently    Comment: G3, P3, MENARCHE, AGE 75, MENOPAUSE AGE 69, NO BC AND HRT X 10 YEARS  Other Topics Concern   Not on file  Social History Narrative   Married for 56 years      3 children, one deceased   6 grand-children, 3 great-grandchildren   Social Drivers of Corporate investment banker Strain: Low Risk  (07/01/2023)   Overall Financial Resource Strain (CARDIA)  Difficulty of Paying Living Expenses: Not hard at all  Food Insecurity: No Food Insecurity (07/01/2023)   Hunger Vital Sign    Worried About Running Out of Food in the Last Year: Never true    Ran Out of Food in the Last Year: Never true  Transportation Needs: No Transportation Needs (07/01/2023)   PRAPARE - Administrator, Civil Service (Medical): No    Lack of Transportation (Non-Medical): No  Physical Activity: Inactive (07/01/2023)   Exercise Vital Sign    Days of Exercise per Week: 0 days    Minutes of Exercise per Session: 0 min  Stress: No Stress Concern Present (07/01/2023)   [employer] of Occupational Health - Occupational Stress Questionnaire    Feeling of Stress : Not at all  Social Connections: Socially Isolated (07/01/2023)   Social Connection and Isolation Panel [NHANES]    Frequency of Communication with Friends and Family: Three times a week    Frequency of Social Gatherings with Friends and Family: Never    Attends Religious Services: Never    Database administrator or Organizations: No    Attends Banker Meetings: Never    Marital Status: Widowed  Intimate Partner Violence: Not At Risk (07/01/2023)   Humiliation, Afraid, Rape, and Kick questionnaire    Fear of Current or Ex-Partner: No    Emotionally  Abused: No    Physically Abused: No    Sexually Abused: No    Review of Systems  Constitutional:  Negative for fatigue and unexpected weight change.  Respiratory:  Negative for chest tightness and shortness of breath.   Cardiovascular:  Negative for chest pain, palpitations and leg swelling.  Gastrointestinal:  Negative for abdominal pain and blood in stool.  Neurological:  Negative for dizziness, syncope, light-headedness and headaches.     Objective:   Vitals:   07/09/23 1159  BP: 128/74  Pulse: (!) 59  Temp: 98 F (36.7 C)  TempSrc: Temporal  SpO2: 98%  Weight: 161 lb 3.2 oz (73.1 kg)  Height: 5\' 4"  (1.626 m)     Physical Exam Vitals reviewed.  Constitutional:      Appearance: Normal appearance. She is well-developed.  HENT:     Head: Normocephalic and atraumatic.  Eyes:     Conjunctiva/sclera: Conjunctivae normal.     Pupils: Pupils are equal, round, and reactive to light.  Neck:     Vascular: No carotid bruit.  Cardiovascular:     Rate and Rhythm: Normal rate and regular rhythm.     Heart sounds: Normal heart sounds.  Pulmonary:     Effort: Pulmonary effort is normal.     Breath sounds: Normal breath sounds.  Abdominal:     Palpations: Abdomen is soft. There is no pulsatile mass.     Tenderness: There is no abdominal tenderness.  Musculoskeletal:     Right lower leg: No edema.     Left lower leg: No edema.     Comments: Calves nontender  Skin:    General: Skin is warm and dry.  Neurological:     Mental Status: She is alert and oriented to person, place, and time.  Psychiatric:        Mood and Affect: Mood normal.        Behavior: Behavior normal.        Assessment & Plan:  KAWTHAR ENNEN is a 89 y.o. female . Excessive cerumen in both ear canals Treatment options discussed, over-the-counter Debrox as option  with RTC precautions for lavage if needed.  Essential hypertension - Plan: Comprehensive metabolic panel with GFR, amLODipine  (NORVASC )  10 MG tablet, chlorthalidone  (HYGROTON ) 25 MG tablet, lisinopril  (ZESTRIL ) 5 MG tablet  Stable, tolerating current regimen. Medications refilled. Labs pending as above.   Hypothyroidism, unspecified type - Plan: TSH, levothyroxine  (SYNTHROID ) 50 MCG tablet  Stable, tolerating current regimen. Medications refilled. Labs pending as above.   Hypercholesteremia - Plan: Lipid panel, simvastatin  (ZOCOR ) 20 MG tablet Tolerating every other day simvastatin , check labs and adjust plan accordingly.  No orders of the defined types were placed in this encounter.  There are no Patient Instructions on file for this visit.    Signed,   Caro Christmas, MD [facility], [facility] 07/09/23 12:07 PM

## 2023-07-09 NOTE — Patient Instructions (Signed)
 I will check your labs today and then we can decide if we can try off the statin altogether temporarily or take a different medication for cholesterol that might be tolerated better.  I am glad to hear the cramps have improved some with every other day dosing but again I need to see what your levels look like today.  No other medication changes at this time.  See information below about earwax.  I do recommend trying over-the-counter Debrox first since there is no impact on your hearing at this time but if that is ineffective please schedule a visit and we can attempt a lavage in office.   Take care!  Earwax Buildup, Adult Your ears make something called earwax. It helps keep germs called bacteria away and protects the skin in your ears. Sometimes, too much earwax can build up. This can cause discomfort or make it harder to hear. What are the causes? Earwax buildup can happen when you have too much earwax in your ears. Earwax is made in the outer part of your ear canal. It's supposed to fall out in small amounts over time. But if your ears aren't able to clean themselves like they should, earwax can build up. What increases the risk? You're more likely to get earwax buildup if: You clean your ears with cotton swabs. You pick at your ears. You use earplugs or in-ear headphones a lot. You wear hearing aids. You may also be more likely to get it if: You're female. You're older. Your ears naturally make more earwax. You have narrow ear canals or extra hair in your ears. Your earwax is too thick or sticky. You have eczema. You're dehydrated. This means there's not enough fluid in your body. What are the signs or symptoms? Symptoms of earwax buildup include: Not being able to hear as well. A feeling of fullness in your ear. Feeling like your ear is plugged. Fluid coming from your ear. Ear pain or an itchy ear. Ringing in your ear. Coughing or problems with balance. How is this  diagnosed? Earwax buildup may be diagnosed based on your symptoms, medical history, and an ear exam. During the exam, your health care provider will look into your ear with a tool called an otoscope. You may also have tests, such as a hearing test. How is this treated? Earwax buildup may be treated by: Using ear drops. Having the earwax removed by a provider. The provider may: Flush the ear with water . Use a tool called a curette that has a loop on the end. Use a suction device. Having surgery. This may be done in severe cases. Follow these instructions at home:  Cleaning your ears Clean your ears as told by your provider. You can clean the outside of your ears with a washcloth or tissue. Do not overclean your ears. Do not put anything into your ear unless told. This includes cotton swabs. General instructions Take over-the-counter and prescription medicines only as told by your provider. Drink enough fluid to keep your pee (urine) pale yellow. This helps thin the earwax. If you have hearing aids, clean them as told. Keep all follow-up visits. If earwax builds up in your ears often or if you use hearing aids, ask your provider how often you should have your ears cleaned. Contact a health care provider if: Your ear pain gets worse. You have a fever. You have pus, blood, or other fluid coming from your ear. You have hearing loss. You have ringing in your ears  that won't go away. You feel like the room is spinning. This is called vertigo. Your symptoms don't get better with treatment. This information is not intended to replace advice given to you by your health care provider. Make sure you discuss any questions you have with your health care provider. Document Revised: 04/25/2022 Document Reviewed: 04/25/2022 Elsevier Patient Education  2024 [employer].

## 2023-07-12 ENCOUNTER — Encounter: Payer: Self-pay | Admitting: Family Medicine

## 2023-07-14 ENCOUNTER — Ambulatory Visit: Payer: Self-pay | Admitting: Family Medicine

## 2023-07-14 NOTE — Telephone Encounter (Signed)
 Pt has been notified.

## 2023-07-14 NOTE — Telephone Encounter (Signed)
-----   Message from Benjiman Bras sent at 07/14/2023 10:22 AM EDT ----- Please call patient.  Electrolytes overall looked okay.Kidney function test was stable.  Thyroid  test was normal.  Cholesterol levels were slightly elevated but improved from previous readings.  Let me know if there are questions

## 2023-08-25 ENCOUNTER — Other Ambulatory Visit: Payer: Self-pay | Admitting: Family Medicine

## 2023-08-25 DIAGNOSIS — I10 Essential (primary) hypertension: Secondary | ICD-10-CM

## 2023-08-27 ENCOUNTER — Encounter: Payer: Self-pay | Admitting: Family Medicine

## 2023-08-27 ENCOUNTER — Ambulatory Visit (INDEPENDENT_AMBULATORY_CARE_PROVIDER_SITE_OTHER): Admitting: Family Medicine

## 2023-08-27 VITALS — BP 130/64 | HR 76 | Temp 97.8°F | Resp 18 | Ht 64.0 in | Wt 161.0 lb

## 2023-08-27 DIAGNOSIS — R252 Cramp and spasm: Secondary | ICD-10-CM

## 2023-08-27 DIAGNOSIS — E7800 Pure hypercholesterolemia, unspecified: Secondary | ICD-10-CM

## 2023-08-27 NOTE — Patient Instructions (Signed)
 Glad to hear the cramps are better.  Okay to stay on the every other day dosing of cholesterol medication.  Last LDL cholesterol or bad cholesterol was improved compared to previous readings.  We can recheck your labs at next visit.  Make sure to stay hydrated and try to limit amount of time outside when the heat increases.  Let me know if there are questions and take care!

## 2023-08-27 NOTE — Progress Notes (Signed)
 Subjective:  Patient ID: Molly Lambert, female    DOB: [birth year]  Age: 89 y.o. MRN: [id number]  CC:  Chief Complaint  Patient presents with   Hyperlipidemia    Pt is doing well, no questions, pt is not fasting     HPI Molly Lambert presents for   Hyperlipidemia: Follow-up from May 14 visit.  Prior arthralgias/myalgias, muscle cramps., had been on simvastatin  20 mg.  CPK level was normal, magnesium level was normal.  Cramps improved with intermittent dosing of simvastatin . Still doing ok - has used mustard or vinegar at times - better. No new cramps. Tylenol  at times for arthralgias. Usually at night. Helps.  Currently taking simvastatin  every other day.  Lab Results  Component Value Date   CHOL 210 (H) 07/09/2023   HDL 64.80 07/09/2023   LDLCALC 105 (H) 07/09/2023   LDLDIRECT 113.0 10/02/2020   TRIG 199.0 (H) 07/09/2023   CHOLHDL 3 07/09/2023   Lab Results  Component Value Date   ALT 15 07/09/2023   AST 18 07/09/2023   ALKPHOS 78 07/09/2023   BILITOT 0.5 07/09/2023    History Patient Active Problem List   Diagnosis Date Noted   OA (osteoarthritis) of hip 07/17/2016   Breast microcalcifications 12/27/2011   History of breast cancer 12/27/2011   Hypothyroidism 08/25/2011   Hypercholesteremia 08/25/2011   HTN (hypertension) 08/25/2011   Malignant neoplasm of central portion of right breast in female, estrogen receptor positive (HCC) 02/27/2011   Breast mass, right 11/28/2010   Past Medical History:  Diagnosis Date   Arthritis    Breast cancer (HCC) 11/2010   Rt breast   Cataract    Erythema    reddened area of epidermis spnning concentrated around sinuses on the face; pt reports cracking, itchiness, and flaking  of affected skin; seen derm no absolute dx, will get 2nd opinion    Hypercholesteremia    Hypertension    NO CARDIAC MD,  [facility]  PCP  ,  URGENT MED   Hypothyroidism    Leg laceration    WITH SURGERY   Paget's carcinoma of the nipple, right  (HCC)    nipple removed   Personal history of radiation therapy    S/P radiation therapy 03/14/11 - 04/05/11   Right Breast / 4256 cGy in 16 Fractions   Thyroid  disease    Past Surgical History:  Procedure Laterality Date   BREAST BIOPSY     BREAST LUMPECTOMY  01/22/11   RIGHT BREAST LUMPECTOMY WITH BIOSPY OF 1 SENTINEL NODE, INVASIVE GRADE II DUCTAL CARCINOMA , INTERMEDIATE GRADE DUCTAL CARCINOMA IN SITU , DERMAL SKIN INVOLVED, MARGINS NEGATIVE,( 0/1)  NODE POSITIVE, ER+, PR+, LOW s-PHASE, HER 2 NEU- NO AMPLIFICATION   BREAST SURGERY  11/28/10   SKIN-PUNCH BIOSPY RIGHT BREAST(NIPPLE), ER+, RP+, LOW S-PHASE, HER 2NEU NEGAITIVE   EYE SURGERY      BILATERAL CATARACT SURGERY   JOINT REPLACEMENT     left hip   PARTIAL MASTECTOMY WITH NEEDLE LOCALIZATION  01/10/2012   Procedure: PARTIAL MASTECTOMY WITH NEEDLE LOCALIZATION;  Surgeon: Debby A. Cornett, MD;  Location: [facility];  Service: General;  Laterality: Right;  right breast needle localized partial mastectomy and removal of nipple    TOTAL HIP ARTHROPLASTY Left 07/17/2016   Procedure: LEFT TOTAL HIP ARTHROPLASTY ANTERIOR APPROACH;  Surgeon: Melodi Lerner, MD;  Location: [facility];  Service: Orthopedics;  Laterality: Left;   TOTAL HIP ARTHROPLASTY Right 11/20/2016   Procedure: RIGHT TOTAL HIP ARTHROPLASTY ANTERIOR  APPROACH;  Surgeon: Melodi Lerner, MD;  Location: [facility];  Service: Orthopedics;  Laterality: Right;   TUBAL LIGATION  1972   Allergies  Allergen Reactions   Xarelto  [Rivaroxaban ]    Prior to Admission medications   Medication Sig Start Date End Date Taking? Authorizing Provider  amLODipine  (NORVASC ) 10 MG tablet TAKE 1 TABLET(10 MG) BY MOUTH DAILY 07/09/23  Yes Levora Reyes SAUNDERS, MD  chlorthalidone  (HYGROTON ) 25 MG tablet TAKE 1 TABLET(25 MG) BY MOUTH DAILY 07/09/23  Yes Levora Reyes SAUNDERS, MD  fluticasone  (FLONASE ) 50 MCG/ACT nasal spray Place 1 spray into both nostrils daily. 10/02/20  Yes Kip Ade, NP   levothyroxine  (SYNTHROID ) 50 MCG tablet TAKE 1 TABLET(50 MCG) BY MOUTH DAILY BEFORE BREAKFAST 07/09/23  Yes Levora Reyes SAUNDERS, MD  lisinopril  (ZESTRIL ) 5 MG tablet TAKE 1/2 TABLET BY MOUTH EVERY DAY 07/09/23  Yes Levora Reyes SAUNDERS, MD  simvastatin  (ZOCOR ) 20 MG tablet Take 1 tablet (20 mg total) by mouth every other day. TAKE 1 TO 2 TABLETS(20 TO 40 MG) BY MOUTH DAILY AT 6 PM 07/09/23  Yes Levora Reyes SAUNDERS, MD   Social History   Socioeconomic History   Marital status: Widowed    Spouse name: Not on file   Number of children: 3   Years of education: 24   Highest education level: Not on file  Occupational History   Occupation: Waitress    Comment: [employer]  Tobacco Use   Smoking status: Never   Smokeless tobacco: Never  Substance and Sexual Activity   Alcohol use: No    Alcohol/week: 0.0 standard drinks of alcohol    Comment: Rarely/On Special Occasions   Drug use: No   Sexual activity: Not Currently    Comment: G3, P3, MENARCHE, AGE 69, MENOPAUSE AGE 15, NO BC AND HRT X 10 YEARS  Other Topics Concern   Not on file  Social History Narrative   Married for 56 years      3 children, one deceased   6 grand-children, 3 great-grandchildren   Social Drivers of Corporate investment banker Strain: Low Risk  (07/01/2023)   Overall Financial Resource Strain (CARDIA)    Difficulty of Paying Living Expenses: Not hard at all  Food Insecurity: No Food Insecurity (07/01/2023)   Hunger Vital Sign    Worried About Running Out of Food in the Last Year: Never true    Ran Out of Food in the Last Year: Never true  Transportation Needs: No Transportation Needs (07/01/2023)   PRAPARE - Administrator, Civil Service (Medical): No    Lack of Transportation (Non-Medical): No  Physical Activity: Inactive (07/01/2023)   Exercise Vital Sign    Days of Exercise per Week: 0 days    Minutes of Exercise per Session: 0 min  Stress: No Stress Concern Present (07/01/2023)   [employer] of Occupational Health - Occupational Stress Questionnaire    Feeling of Stress : Not at all  Social Connections: Socially Isolated (07/01/2023)   Social Connection and Isolation Panel    Frequency of Communication with Friends and Family: Three times a week    Frequency of Social Gatherings with Friends and Family: Never    Attends Religious Services: Never    Database administrator or Organizations: No    Attends Banker Meetings: Never    Marital Status: Widowed  Intimate Partner Violence: Not At Risk (07/01/2023)   Humiliation, Afraid, Rape, and Kick questionnaire  Fear of Current or Ex-Partner: No    Emotionally Abused: No    Physically Abused: No    Sexually Abused: No    Review of Systems Per HPI  Objective:   Vitals:   08/27/23 1029  BP: 130/64  Pulse: 76  Resp: 18  Temp: 97.8 F (36.6 C)  TempSrc: Temporal  SpO2: 97%  Weight: 161 lb (73 kg)  Height: 5' 4 (1.626 m)     Physical Exam Vitals reviewed.  Constitutional:      Appearance: Normal appearance. She is well-developed.  HENT:     Head: Normocephalic and atraumatic.  Eyes:     Conjunctiva/sclera: Conjunctivae normal.     Pupils: Pupils are equal, round, and reactive to light.  Neck:     Vascular: No carotid bruit.  Cardiovascular:     Rate and Rhythm: Normal rate and regular rhythm.     Heart sounds: Normal heart sounds.  Pulmonary:     Effort: Pulmonary effort is normal.     Breath sounds: Normal breath sounds.  Abdominal:     Palpations: Abdomen is soft. There is no pulsatile mass.     Tenderness: There is no abdominal tenderness.  Musculoskeletal:     Right lower leg: No edema.     Left lower leg: No edema.  Skin:    General: Skin is warm and dry.  Neurological:     Mental Status: She is alert and oriented to person, place, and time.  Psychiatric:        Mood and Affect: Mood normal.        Behavior: Behavior normal.        Assessment & Plan:  Molly Lambert  is a 89 y.o. female . Muscle cramps  Hypercholesteremia  Muscle cramps have improved.  Tolerating every other day dosing of simvastatin , continue same with repeat labs in 4 months.  Most recent labs were reviewed.  Continue to stay hydrated, and take breaks from the heat to minimize heat illness, volume depletion.  25-month follow-up.  RTC precautions if new or worsening symptoms sooner.  No orders of the defined types were placed in this encounter.  Patient Instructions  Glad to hear the cramps are better.  Okay to stay on the every other day dosing of cholesterol medication.  Last LDL cholesterol or bad cholesterol was improved compared to previous readings.  We can recheck your labs at next visit.  Make sure to stay hydrated and try to limit amount of time outside when the heat increases.  Let me know if there are questions and take care!    Signed,   Reyes Pines, MD [facility], [facility] 08/27/23 11:20 AM

## 2023-12-29 ENCOUNTER — Ambulatory Visit: Payer: Self-pay

## 2023-12-29 ENCOUNTER — Ambulatory Visit: Admitting: Family Medicine

## 2023-12-29 NOTE — Telephone Encounter (Signed)
 FYI - patient is declining appt

## 2023-12-29 NOTE — Telephone Encounter (Signed)
 Patient doesn't want to come in for an appointment---she states if she gets worse she will go to Urgent Care or the ER She also would like any further advice/recommendations from her PCP if available  FYI Only or Action Required?: Action required by provider: clinical question for provider.  Patient was last seen in primary care on 08/27/2023 by Levora Reyes SAUNDERS, MD.  Called Nurse Triage reporting Nasal Congestion.  Symptoms began a week ago.  Interventions attempted: OTC medications: daytime cold flu relief medicine and sinus pressure/pain medicine and nasal spray & theraflu in the past week, Rest, hydration, or home remedies, and Other: warm wet washcloth on her face.  Symptoms are: unchanged.  Triage Disposition: See PCP When Office is Open (Within 3 Days)  Patient/caregiver understands and will follow disposition?: No, wishes to speak with PCP               Copied from CRM #[id number]. Topic: Clinical - Red Word Triage >> Dec 29, 2023  9:42 AM Rea ORN wrote: Red Word that prompted transfer to Nurse Triage: Productive cough, horse voice for the past several days. Pt would like to know what she can take over the counter. Reason for Disposition  [1] Using nasal washes and pain medicine > 24 hours AND [2] sinus pain (around cheekbone or eye) persists  Answer Assessment - Initial Assessment Questions A week ago patient got sick after seeing her grandson who was sick 1-2 nights of sore throat, runny nose, congestion Patient states she feels fine but she is sneezing & has a hoarse voice  Yellow phlegm when blowing her nose Clear mucous coughing up Feels worse at night Denies fever, difficulty breathing Denies pain  Theraflu helped previously Last night patient took daytime Cold, Flu Relief medicine, sinus pressure/pain medication, nasal spray  Patient states that she doesn't want to come in to be seen at this time She states she feels fine and she is going to continue with  her over the counter medications She would appreciate any further recommendations from her PCP if available  Patient is advised to call us  back if anything changes or with any further questions/concerns. Patient is advised that if anything worsens to go to the Emergency Room. Patient verbalized understanding.   1. LOCATION: Where does it hurt?      Sinuses--congestion 2. ONSET: When did the sinus pain start?  (e.g., hours, days)      A week ago 3. SEVERITY: How bad is the pain?   (Scale 0-10; or none, mild, moderate or severe)     congestion 4. RECURRENT SYMPTOM: Have you ever had sinus problems before? If Yes, ask: When was the last time? and What happened that time?      ----- 5. NASAL CONGESTION: Is the nose blocked? If Yes, ask: Can you open it or must you breathe through your mouth?     yes 6. NASAL DISCHARGE: Do you have discharge from your nose? If so ask, What color?     yellow 7. FEVER: Do you have a fever? If Yes, ask: What is it, how was it measured, and when did it start?      denies 8. OTHER SYMPTOMS: Do you have any other symptoms? (e.g., sore throat, cough, earache, difficulty breathing)      Slight  Protocols used: Sinus Pain or Congestion-A-AH

## 2023-12-30 NOTE — Telephone Encounter (Signed)
 Called patient and she verbalized understanding of plan. She will call back if she gets worse or is not improving.

## 2023-12-30 NOTE — Telephone Encounter (Signed)
 Looking at the initial phone call her symptoms appear to be due to a virus.  Symptomatic care with saline nasal spray, fluids and rest would initially be the recommendation.  If she is not improving in the next day or 2 I would recommend an office visit to decide if antibiotics are indicated or other testing.  Should be seen sooner if she has developed any fever or worsening symptoms.

## 2024-01-09 ENCOUNTER — Other Ambulatory Visit: Payer: Self-pay | Admitting: Family Medicine

## 2024-01-09 DIAGNOSIS — I10 Essential (primary) hypertension: Secondary | ICD-10-CM

## 2024-02-04 ENCOUNTER — Encounter: Payer: Self-pay | Admitting: Family Medicine

## 2024-02-04 ENCOUNTER — Ambulatory Visit (INDEPENDENT_AMBULATORY_CARE_PROVIDER_SITE_OTHER): Admitting: Family Medicine

## 2024-02-04 VITALS — BP 100/58 | HR 58 | Temp 97.9°F | Resp 17 | Ht 64.0 in | Wt 158.4 lb

## 2024-02-04 DIAGNOSIS — R7303 Prediabetes: Secondary | ICD-10-CM | POA: Diagnosis not present

## 2024-02-04 DIAGNOSIS — E7800 Pure hypercholesterolemia, unspecified: Secondary | ICD-10-CM

## 2024-02-04 DIAGNOSIS — I10 Essential (primary) hypertension: Secondary | ICD-10-CM | POA: Diagnosis not present

## 2024-02-04 DIAGNOSIS — E039 Hypothyroidism, unspecified: Secondary | ICD-10-CM

## 2024-02-04 DIAGNOSIS — Z853 Personal history of malignant neoplasm of breast: Secondary | ICD-10-CM | POA: Diagnosis not present

## 2024-02-04 LAB — LIPID PANEL
Cholesterol: 196 mg/dL (ref 0–200)
HDL: 67.1 mg/dL (ref >39.00)
LDL Cholesterol: 99 mg/dL (ref 0–99)
NonHDL: 128.42
Total CHOL/HDL Ratio: 3
Triglycerides: 149 mg/dL (ref 0.0–149.0)
VLDL: 29.8 mg/dL (ref 0.0–40.0)

## 2024-02-04 LAB — COMPREHENSIVE METABOLIC PANEL WITH GFR
ALT: 13 U/L (ref 0–35)
AST: 19 U/L (ref 0–37)
Albumin: 4.4 g/dL (ref 3.5–5.2)
Alkaline Phosphatase: 79 U/L (ref 39–117)
BUN: 18 mg/dL (ref 6–23)
CO2: 29 meq/L (ref 19–32)
Calcium: 10.1 mg/dL (ref 8.4–10.5)
Chloride: 98 meq/L (ref 96–112)
Creatinine, Ser: 1.01 mg/dL (ref 0.40–1.20)
GFR: 49.92 mL/min — ABNORMAL LOW (ref >60.00)
Glucose, Bld: 95 mg/dL (ref 70–99)
Potassium: 3.8 meq/L (ref 3.5–5.1)
Sodium: 136 meq/L (ref 135–145)
Total Bilirubin: 0.5 mg/dL (ref 0.2–1.2)
Total Protein: 7.3 g/dL (ref 6.0–8.3)

## 2024-02-04 LAB — HEMOGLOBIN A1C: Hgb A1c MFr Bld: 6.1 % (ref 4.6–6.5)

## 2024-02-04 LAB — TSH: TSH: 1.58 u[IU]/mL (ref 0.35–5.50)

## 2024-02-04 MED ORDER — LEVOTHYROXINE SODIUM 50 MCG PO TABS: ORAL_TABLET | ORAL | 2 refills | Status: AC

## 2024-02-04 MED ORDER — SIMVASTATIN 20 MG PO TABS: 20.0000 mg | ORAL_TABLET | ORAL | 2 refills | Status: AC

## 2024-02-04 MED ORDER — CHLORTHALIDONE 25 MG PO TABS: ORAL_TABLET | ORAL | 2 refills | Status: AC

## 2024-02-04 MED ORDER — AMLODIPINE BESYLATE 10 MG PO TABS: ORAL_TABLET | ORAL | 2 refills | Status: AC

## 2024-02-04 NOTE — Progress Notes (Signed)
 Subjective:  Patient ID: Molly Lambert, female    DOB: Jan 10, 1935  Age: 89 y.o. MRN: [id number]  CC:  Chief Complaint  Patient presents with   Hypertension    No questions or concerns.     HPI Molly Lambert presents for   Hypertension: Amlodipine  10 mg daily, lisinopril  2.5 mg daily, chlorthalidone  25 mg daily.  Home readings: 150/70. 130-140's at times.  No lightheadedness/dizziness. No regular low bP at home.  BP Readings from Last 3 Encounters:  02/04/24 (!) 100/58  08/27/23 130/64  07/09/23 128/74   Lab Results  Component Value Date   CREATININE 0.93 07/09/2023   Prediabetes: Diet/exercise approach.  Lab Results  Component Value Date   HGBA1C 6.1 09/26/2022   Wt Readings from Last 3 Encounters:  02/04/24 158 lb 6.4 oz (71.8 kg)  08/27/23 161 lb (73 kg)  07/09/23 161 lb 3.2 oz (73.1 kg)   Hypothyroidism: Lab Results  Component Value Date   TSH 3.54 07/09/2023  Taking medication daily.  Synthroid  50 mcg No new hot or cold intolerance. No new hair or skin changes, heart palpitations or new fatigue. No new weight changes.   Hyperlipidemia: Last discussed in July, episodic cramps discussed at that time.  She is on statin with simvastatin  20 mg daily, normal CPK and normal magnesium level previously.  Intermittent dosing of simvastatin  helped with cramps.  Every other day simvastatin  was tolerated at that time.   Still taking every other day. Tylenol  for sore joints at times.  Lab Results  Component Value Date   CHOL 210 (H) 07/09/2023   HDL 64.80 07/09/2023   LDLCALC 105 (H) 07/09/2023   LDLDIRECT 113.0 10/02/2020   TRIG 199.0 (H) 07/09/2023   CHOLHDL 3 07/09/2023   Lab Results  Component Value Date   ALT 15 07/09/2023   AST 18 07/09/2023   ALKPHOS 78 07/09/2023   BILITOT 0.5 07/09/2023   History of breast cancer: Right breast ER positive breast cancer, 2013.  Treated with anastrozole  after lumpectomy, surgery, radiation therapy, antiestrogen therapy,  yearly mammograms and clinical breast exam.  Mammogram 10/17/2022, no mammographic evidence of malignancy, repeat planned in 1 year. We discussed recommendations for further testing and age - recommendations based on life expectancy of 10 years or more - she has decided against further mammograms at this time. Discussed  No new lumps, nodules on her self breast exam. The National [employer] recommends stopping mammography when life expectancy is <=10 years or when further therapeutic intervention would not be appropriate or acceptable to the patient, regardless of age  Plans to live with daughter after next year. Still independent in ADL's, IADL's, balancing checkbook.    History Patient Active Problem List   Diagnosis Date Noted   OA (osteoarthritis) of hip 07/17/2016   Breast microcalcifications 12/27/2011   History of breast cancer 12/27/2011   Hypothyroidism 08/25/2011   Hypercholesteremia 08/25/2011   HTN (hypertension) 08/25/2011   Breast mass, right 11/28/2010   Past Medical History:  Diagnosis Date   Arthritis    Breast cancer (HCC) 11/2010   Rt breast   Cataract    Erythema    reddened area of epidermis spnning concentrated around sinuses on the face; pt reports cracking, itchiness, and flaking  of affected skin; seen derm no absolute dx, will get 2nd opinion    Hypercholesteremia    Hypertension    NO CARDIAC MD,  [facility]  PCP  ,  URGENT [facility]   Hypothyroidism  Leg laceration    WITH SURGERY   Paget's carcinoma of the nipple, right (HCC)    nipple removed   Personal history of radiation therapy    S/P radiation therapy 03/14/11 - 04/05/11   Right Breast / 4256 cGy in 16 Fractions   Thyroid  disease    Past Surgical History:  Procedure Laterality Date   BREAST BIOPSY     BREAST LUMPECTOMY  01/22/11   RIGHT BREAST LUMPECTOMY WITH BIOSPY OF 1 SENTINEL NODE, INVASIVE GRADE II DUCTAL CARCINOMA , INTERMEDIATE GRADE DUCTAL CARCINOMA IN SITU , DERMAL SKIN  INVOLVED, MARGINS NEGATIVE,( 0/1)  NODE POSITIVE, ER+, PR+, LOW s-PHASE, HER 2 NEU- NO AMPLIFICATION   BREAST SURGERY  11/28/10   SKIN-PUNCH BIOSPY RIGHT BREAST(NIPPLE), ER+, RP+, LOW S-PHASE, HER 2NEU NEGAITIVE   EYE SURGERY      BILATERAL CATARACT SURGERY   JOINT REPLACEMENT     left hip   PARTIAL MASTECTOMY WITH NEEDLE LOCALIZATION  01/10/2012   Procedure: PARTIAL MASTECTOMY WITH NEEDLE LOCALIZATION;  Surgeon: Debby A. Cornett, MD;  Location: [facility];  Service: General;  Laterality: Right;  right breast needle localized partial mastectomy and removal of nipple    TOTAL HIP ARTHROPLASTY Left 07/17/2016   Procedure: LEFT TOTAL HIP ARTHROPLASTY ANTERIOR APPROACH;  Surgeon: Melodi Lerner, MD;  Location: [facility];  Service: Orthopedics;  Laterality: Left;   TOTAL HIP ARTHROPLASTY Right 11/20/2016   Procedure: RIGHT TOTAL HIP ARTHROPLASTY ANTERIOR APPROACH;  Surgeon: Melodi Lerner, MD;  Location: [facility];  Service: Orthopedics;  Laterality: Right;   TUBAL LIGATION  1972   Allergies  Allergen Reactions   Xarelto  [Rivaroxaban ]    Prior to Admission medications   Medication Sig Start Date End Date Taking? Authorizing Provider  amLODipine  (NORVASC ) 10 MG tablet TAKE 1 TABLET(10 MG) BY MOUTH DAILY 07/09/23  Yes Molly Reyes SAUNDERS, MD  chlorthalidone  (HYGROTON ) 25 MG tablet TAKE 1 TABLET(25 MG) BY MOUTH DAILY 07/09/23  Yes Molly Reyes SAUNDERS, MD  fluticasone  (FLONASE ) 50 MCG/ACT nasal spray Place 1 spray into both nostrils daily. 10/02/20  Yes Kip Ade, NP  levothyroxine  (SYNTHROID ) 50 MCG tablet TAKE 1 TABLET(50 MCG) BY MOUTH DAILY BEFORE BREAKFAST 07/09/23  Yes Molly Reyes SAUNDERS, MD  lisinopril  (ZESTRIL ) 5 MG tablet TAKE 1/2 TABLET BY MOUTH ONCE DAILY 01/09/24  Yes Molly Reyes SAUNDERS, MD  simvastatin  (ZOCOR ) 20 MG tablet Take 1 tablet (20 mg total) by mouth every other day. TAKE 1 TO 2 TABLETS(20 TO 40 MG) BY MOUTH DAILY AT 6 PM 07/09/23  Yes Molly Reyes SAUNDERS, MD   Social History    Socioeconomic History   Marital status: Widowed    Spouse name: Not on file   Number of children: 3   Years of education: 72   Highest education level: Not on file  Occupational History   Occupation: Waitress    Comment: [employer]  Tobacco Use   Smoking status: Never   Smokeless tobacco: Never  Substance and Sexual Activity   Alcohol use: No    Alcohol/week: 0.0 standard drinks of alcohol    Comment: Rarely/On Special Occasions   Drug use: No   Sexual activity: Not Currently    Comment: G3, P3, MENARCHE, AGE 37, MENOPAUSE AGE 32, NO BC AND HRT X 10 YEARS  Other Topics Concern   Not on file  Social History Narrative   Married for 56 years      3 children, one deceased   6 grand-children, 3 great-grandchildren  Social Drivers of Corporate Investment Banker Strain: Low Risk  (07/01/2023)   Overall Financial Resource Strain (CARDIA)    Difficulty of Paying Living Expenses: Not hard at all  Food Insecurity: No Food Insecurity (07/01/2023)   Hunger Vital Sign    Worried About Running Out of Food in the Last Year: Never true    Ran Out of Food in the Last Year: Never true  Transportation Needs: No Transportation Needs (07/01/2023)   PRAPARE - Administrator, Civil Service (Medical): No    Lack of Transportation (Non-Medical): No  Physical Activity: Inactive (07/01/2023)   Exercise Vital Sign    Days of Exercise per Week: 0 days    Minutes of Exercise per Session: 0 min  Stress: No Stress Concern Present (07/01/2023)   [employer] of Occupational Health - Occupational Stress Questionnaire    Feeling of Stress : Not at all  Social Connections: Socially Isolated (07/01/2023)   Social Connection and Isolation Panel    Frequency of Communication with Friends and Family: Three times a week    Frequency of Social Gatherings with Friends and Family: Never    Attends Religious Services: Never    Database Administrator or Organizations: No    Attends Occupational Hygienist Meetings: Never    Marital Status: Widowed  Intimate Partner Violence: Not At Risk (07/01/2023)   Humiliation, Afraid, Rape, and Kick questionnaire    Fear of Current or Ex-Partner: No    Emotionally Abused: No    Physically Abused: No    Sexually Abused: No    Review of Systems  Constitutional:  Negative for fatigue and unexpected weight change.  Respiratory:  Negative for chest tightness and shortness of breath.   Cardiovascular:  Negative for chest pain, palpitations and leg swelling.  Gastrointestinal:  Negative for abdominal pain and blood in stool.  Neurological:  Negative for dizziness, syncope, light-headedness and headaches.     Objective:   Vitals:   02/04/24 0954 02/04/24 1107  BP: (!) 106/52 (!) 100/58  Pulse: (!) 58   Resp: 17   Temp: 97.9 F (36.6 C)   TempSrc: Temporal   SpO2: 97%   Weight: 158 lb 6.4 oz (71.8 kg)   Height: 5' 4 (1.626 m)      Physical Exam Vitals reviewed.  Constitutional:      Appearance: Normal appearance. She is well-developed.  HENT:     Head: Normocephalic and atraumatic.  Eyes:     Conjunctiva/sclera: Conjunctivae normal.     Pupils: Pupils are equal, round, and reactive to light.  Neck:     Vascular: No carotid bruit.  Cardiovascular:     Rate and Rhythm: Normal rate and regular rhythm.     Heart sounds: Normal heart sounds.  Pulmonary:     Effort: Pulmonary effort is normal.     Breath sounds: Normal breath sounds.  Abdominal:     Palpations: Abdomen is soft. There is no pulsatile mass.     Tenderness: There is no abdominal tenderness.  Musculoskeletal:     Right lower leg: No edema.     Left lower leg: No edema.  Skin:    General: Skin is warm and dry.  Neurological:     Mental Status: She is alert and oriented to person, place, and time.  Psychiatric:        Mood and Affect: Mood normal.        Behavior: Behavior normal.  Assessment & Plan:  Molly Lambert is a 89 y.o. female  . Essential hypertension - Plan: amLODipine  (NORVASC ) 10 MG tablet, chlorthalidone  (HYGROTON ) 25 MG tablet  - BP on lower side today including on recheck.  Will stop lisinopril  for now, home monitoring with RTC precautions and 58-month recheck.  Labs pending adjust plan accordingly.  History of breast cancer  - Over 10 years since treatment, mammogram noted last year.  At this point she has decided not to pursue repeat mammograms, discussed recommendations regarding timing of discontinuation of mammograms, including discussion of life expectancy.   Hyperlipidemia, tolerating intermittent dosing of statin, check labs and adjust plan accordingly continue every other day dosing of simvastatin .  Hypothyroidism, unspecified type - Plan: TSH, levothyroxine  (SYNTHROID ) 50 MCG tablet  - Tolerating Synthroid , check labs and adjust plan accordingly.  Prediabetes - Plan: Hemoglobin A1c  -Check A1c.  Adjust plan accordingly.  Meds ordered this encounter  Medications   amLODipine  (NORVASC ) 10 MG tablet    Sig: TAKE 1 TABLET(10 MG) BY MOUTH DAILY    Dispense:  90 tablet    Refill:  2   chlorthalidone  (HYGROTON ) 25 MG tablet    Sig: TAKE 1 TABLET(25 MG) BY MOUTH DAILY    Dispense:  90 tablet    Refill:  2   levothyroxine  (SYNTHROID ) 50 MCG tablet    Sig: TAKE 1 TABLET(50 MCG) BY MOUTH DAILY BEFORE BREAKFAST    Dispense:  90 tablet    Refill:  2   simvastatin  (ZOCOR ) 20 MG tablet    Sig: Take 1 tablet (20 mg total) by mouth every other day. TAKE 1 TO 2 TABLETS(20 TO 40 MG) BY MOUTH DAILY AT 6 PM    Dispense:  45 tablet    Refill:  2   Patient Instructions  Thank you for coming in today.  Based on the lower blood pressures in the office today lets stop lisinopril .  Continue other medication and check your blood pressures at home.  If you have any elevated readings at home, schedule a visit and bring your blood pressure meter to make sure you are obtaining accurate readings.  Otherwise I will see you  in 3 months.  No other change in medications at this time. If there are any concerns on your bloodwork, I will let you know. Take care!     Signed,   Reyes Pines, MD [facility], [facility] 02/04/24 11:11 AM

## 2024-02-04 NOTE — Patient Instructions (Signed)
 Thank you for coming in today.  Based on the lower blood pressures in the office today lets stop lisinopril .  Continue other medication and check your blood pressures at home.  If you have any elevated readings at home, schedule a visit and bring your blood pressure meter to make sure you are obtaining accurate readings.  Otherwise I will see you in 3 months.  No other change in medications at this time. If there are any concerns on your bloodwork, I will let you know. Take care!

## 2024-02-10 ENCOUNTER — Ambulatory Visit: Payer: Self-pay | Admitting: Family Medicine

## 2024-02-11 NOTE — Progress Notes (Signed)
 [facility] to discuss lab results. If patients calls back please relay

## 2024-02-17 NOTE — Progress Notes (Signed)
 [facility] to discuss lab results. If patients calls back please relay

## 2024-02-20 NOTE — Progress Notes (Signed)
 [facility] to discuss lab results. If patients calls back please relay

## 2024-05-05 ENCOUNTER — Ambulatory Visit: Admitting: Family Medicine

## 2024-07-06 ENCOUNTER — Encounter
